# Patient Record
Sex: Male | Born: 1970 | Race: Black or African American | Hispanic: No | Marital: Married | Smoking: Never smoker
Health system: Southern US, Community
[De-identification: ages and names within clinical notes are randomized; demographics above are authoritative.]

## PROBLEM LIST (undated history)

## (undated) DIAGNOSIS — I10 Essential (primary) hypertension: Secondary | ICD-10-CM

## (undated) DIAGNOSIS — K8050 Calculus of bile duct without cholangitis or cholecystitis without obstruction: Secondary | ICD-10-CM

## (undated) DIAGNOSIS — Z9989 Dependence on other enabling machines and devices: Secondary | ICD-10-CM

## (undated) DIAGNOSIS — Z973 Presence of spectacles and contact lenses: Secondary | ICD-10-CM

## (undated) DIAGNOSIS — M7630 Iliotibial band syndrome, unspecified leg: Secondary | ICD-10-CM

## (undated) DIAGNOSIS — G4730 Sleep apnea, unspecified: Secondary | ICD-10-CM

## (undated) DIAGNOSIS — H409 Unspecified glaucoma: Secondary | ICD-10-CM

## (undated) DIAGNOSIS — H269 Unspecified cataract: Secondary | ICD-10-CM

## (undated) DIAGNOSIS — I440 Atrioventricular block, first degree: Secondary | ICD-10-CM

## (undated) DIAGNOSIS — M1990 Unspecified osteoarthritis, unspecified site: Secondary | ICD-10-CM

## (undated) DIAGNOSIS — D1803 Hemangioma of intra-abdominal structures: Secondary | ICD-10-CM

## (undated) DIAGNOSIS — K589 Irritable bowel syndrome without diarrhea: Secondary | ICD-10-CM

## (undated) DIAGNOSIS — G4733 Obstructive sleep apnea (adult) (pediatric): Secondary | ICD-10-CM

## (undated) HISTORY — DX: Iliotibial band syndrome, unspecified leg: M76.30

## (undated) HISTORY — PX: COLONOSCOPY WITH ESOPHAGOGASTRODUODENOSCOPY (EGD): SHX5779

## (undated) HISTORY — PX: WRIST FRACTURE SURGERY: SHX121

## (undated) HISTORY — PX: UPPER ESOPHAGEAL ENDOSCOPIC ULTRASOUND (EUS): SHX6562

## (undated) HISTORY — DX: Sleep apnea, unspecified: G47.30

## (undated) HISTORY — DX: Unspecified osteoarthritis, unspecified site: M19.90

## (undated) HISTORY — DX: Atrioventricular block, first degree: I44.0

## (undated) HISTORY — DX: Unspecified cataract: H26.9

---

## 1997-09-23 ENCOUNTER — Emergency Department (HOSPITAL_COMMUNITY): Admission: EM | Admit: 1997-09-23 | Discharge: 1997-09-23 | Payer: Self-pay | Admitting: Emergency Medicine

## 1999-06-19 ENCOUNTER — Emergency Department (HOSPITAL_COMMUNITY): Admission: EM | Admit: 1999-06-19 | Discharge: 1999-06-19 | Payer: Self-pay

## 1999-08-02 ENCOUNTER — Other Ambulatory Visit: Admission: RE | Admit: 1999-08-02 | Discharge: 1999-08-02 | Payer: Self-pay | Admitting: Gastroenterology

## 1999-08-02 ENCOUNTER — Encounter (INDEPENDENT_AMBULATORY_CARE_PROVIDER_SITE_OTHER): Payer: Self-pay | Admitting: Specialist

## 1999-10-09 ENCOUNTER — Encounter: Payer: Self-pay | Admitting: Emergency Medicine

## 1999-10-09 ENCOUNTER — Emergency Department (HOSPITAL_COMMUNITY): Admission: EM | Admit: 1999-10-09 | Discharge: 1999-10-09 | Payer: Self-pay | Admitting: Emergency Medicine

## 2000-05-10 ENCOUNTER — Emergency Department (HOSPITAL_COMMUNITY): Admission: EM | Admit: 2000-05-10 | Discharge: 2000-05-10 | Payer: Self-pay | Admitting: Emergency Medicine

## 2000-05-10 ENCOUNTER — Encounter: Payer: Self-pay | Admitting: Emergency Medicine

## 2001-04-01 ENCOUNTER — Encounter: Payer: Self-pay | Admitting: Gastroenterology

## 2001-04-01 ENCOUNTER — Inpatient Hospital Stay (HOSPITAL_COMMUNITY): Admission: EM | Admit: 2001-04-01 | Discharge: 2001-04-02 | Payer: Self-pay | Admitting: Gastroenterology

## 2001-04-07 ENCOUNTER — Emergency Department (HOSPITAL_COMMUNITY): Admission: EM | Admit: 2001-04-07 | Discharge: 2001-04-08 | Payer: Self-pay | Admitting: Emergency Medicine

## 2001-05-06 ENCOUNTER — Encounter: Payer: Self-pay | Admitting: Gastroenterology

## 2001-05-06 ENCOUNTER — Ambulatory Visit (HOSPITAL_COMMUNITY): Admission: RE | Admit: 2001-05-06 | Discharge: 2001-05-06 | Payer: Self-pay | Admitting: Gastroenterology

## 2002-08-09 ENCOUNTER — Emergency Department (HOSPITAL_COMMUNITY): Admission: EM | Admit: 2002-08-09 | Discharge: 2002-08-09 | Payer: Self-pay | Admitting: Emergency Medicine

## 2002-08-09 ENCOUNTER — Encounter: Payer: Self-pay | Admitting: Emergency Medicine

## 2002-09-07 ENCOUNTER — Ambulatory Visit (HOSPITAL_COMMUNITY): Admission: RE | Admit: 2002-09-07 | Discharge: 2002-09-07 | Payer: Self-pay | Admitting: Gastroenterology

## 2002-09-07 ENCOUNTER — Encounter: Payer: Self-pay | Admitting: Gastroenterology

## 2003-06-28 ENCOUNTER — Emergency Department (HOSPITAL_COMMUNITY): Admission: EM | Admit: 2003-06-28 | Discharge: 2003-06-28 | Payer: Self-pay | Admitting: Emergency Medicine

## 2004-05-21 ENCOUNTER — Ambulatory Visit (HOSPITAL_BASED_OUTPATIENT_CLINIC_OR_DEPARTMENT_OTHER): Admission: RE | Admit: 2004-05-21 | Discharge: 2004-05-21 | Payer: Self-pay | Admitting: Otolaryngology

## 2005-08-07 ENCOUNTER — Ambulatory Visit: Payer: Self-pay | Admitting: Gastroenterology

## 2007-01-14 ENCOUNTER — Ambulatory Visit: Payer: Self-pay | Admitting: Gastroenterology

## 2007-01-14 LAB — CONVERTED CEMR LAB
ALT: 31 units/L (ref 0–53)
Basophils Relative: 0.9 % (ref 0.0–1.0)
Bilirubin, Direct: 0.1 mg/dL (ref 0.0–0.3)
CO2: 29 meq/L (ref 19–32)
Calcium: 9.1 mg/dL (ref 8.4–10.5)
Eosinophils Absolute: 0.1 10*3/uL (ref 0.0–0.6)
Eosinophils Relative: 1.2 % (ref 0.0–5.0)
GFR calc Af Amer: 99 mL/min
GFR calc non Af Amer: 81 mL/min
Glucose, Bld: 97 mg/dL (ref 70–99)
HCT: 41.3 % (ref 39.0–52.0)
Hemoglobin: 13.9 g/dL (ref 13.0–17.0)
Lymphocytes Relative: 35.6 % (ref 12.0–46.0)
MCV: 83.2 fL (ref 78.0–100.0)
Monocytes Absolute: 0.5 10*3/uL (ref 0.2–0.7)
Neutro Abs: 3.4 10*3/uL (ref 1.4–7.7)
Neutrophils Relative %: 53.8 % (ref 43.0–77.0)
Potassium: 3.6 meq/L (ref 3.5–5.1)
Sed Rate: 5 mm/hr (ref 0–20)
Sodium: 143 meq/L (ref 135–145)
Total Protein: 6.9 g/dL (ref 6.0–8.3)
WBC: 6.3 10*3/uL (ref 4.5–10.5)

## 2007-03-10 ENCOUNTER — Ambulatory Visit: Payer: Self-pay | Admitting: Gastroenterology

## 2007-03-24 ENCOUNTER — Ambulatory Visit: Payer: Self-pay | Admitting: Gastroenterology

## 2007-03-24 ENCOUNTER — Encounter: Payer: Self-pay | Admitting: Gastroenterology

## 2007-09-06 DIAGNOSIS — I10 Essential (primary) hypertension: Secondary | ICD-10-CM | POA: Insufficient documentation

## 2007-09-06 DIAGNOSIS — D1803 Hemangioma of intra-abdominal structures: Secondary | ICD-10-CM

## 2007-09-06 HISTORY — DX: Essential (primary) hypertension: I10

## 2007-09-06 HISTORY — DX: Hemangioma of intra-abdominal structures: D18.03

## 2008-10-26 ENCOUNTER — Ambulatory Visit: Payer: Self-pay | Admitting: Gastroenterology

## 2009-03-04 ENCOUNTER — Encounter: Payer: Self-pay | Admitting: Gastroenterology

## 2009-03-15 ENCOUNTER — Ambulatory Visit: Payer: Self-pay | Admitting: Gastroenterology

## 2009-03-15 DIAGNOSIS — E669 Obesity, unspecified: Secondary | ICD-10-CM

## 2009-03-15 HISTORY — DX: Obesity, unspecified: E66.9

## 2009-03-16 LAB — CONVERTED CEMR LAB
AST: 39 units/L — ABNORMAL HIGH (ref 0–37)
Alkaline Phosphatase: 38 units/L — ABNORMAL LOW (ref 39–117)
Chloride: 102 meq/L (ref 96–112)
Eosinophils Absolute: 0 10*3/uL (ref 0.0–0.7)
Eosinophils Relative: 0.3 % (ref 0.0–5.0)
Ferritin: 157.6 ng/mL (ref 22.0–322.0)
GFR calc non Af Amer: 73.35 mL/min (ref >60)
Iron: 51 ug/dL (ref 42–165)
Lymphocytes Relative: 27.6 % (ref 12.0–46.0)
MCV: 82.7 fL (ref 78.0–100.0)
Monocytes Absolute: 0.6 10*3/uL (ref 0.1–1.0)
Neutrophils Relative %: 63.9 % (ref 43.0–77.0)
Platelets: 186 10*3/uL (ref 150.0–400.0)
Potassium: 4.2 meq/L (ref 3.5–5.1)
Saturation Ratios: 13.8 % — ABNORMAL LOW (ref 20.0–50.0)
Sed Rate: 7 mm/hr (ref 0–22)
Sodium: 142 meq/L (ref 135–145)
Total Bilirubin: 1 mg/dL (ref 0.3–1.2)
Transferrin: 263.7 mg/dL (ref 212.0–360.0)
WBC: 7.6 10*3/uL (ref 4.5–10.5)

## 2009-03-21 ENCOUNTER — Ambulatory Visit (HOSPITAL_COMMUNITY): Admission: RE | Admit: 2009-03-21 | Discharge: 2009-03-21 | Payer: Self-pay | Admitting: Gastroenterology

## 2009-05-03 ENCOUNTER — Ambulatory Visit: Payer: Self-pay | Admitting: Internal Medicine

## 2009-05-03 ENCOUNTER — Telehealth: Payer: Self-pay | Admitting: Gastroenterology

## 2009-05-03 DIAGNOSIS — R1031 Right lower quadrant pain: Secondary | ICD-10-CM

## 2009-05-10 LAB — CONVERTED CEMR LAB
Basophils Absolute: 0.1 10*3/uL (ref 0.0–0.1)
Bilirubin Urine: NEGATIVE
Leukocytes, UA: NEGATIVE
Lymphocytes Relative: 37.9 % (ref 12.0–46.0)
Lymphs Abs: 2.3 10*3/uL (ref 0.7–4.0)
Monocytes Relative: 11.1 % (ref 3.0–12.0)
Neutrophils Relative %: 49 % (ref 43.0–77.0)
Nitrite: NEGATIVE
Platelets: 182 10*3/uL (ref 150.0–400.0)
RDW: 12.7 % (ref 11.5–14.6)
Specific Gravity, Urine: 1.02 (ref 1.000–1.030)
pH: 5.5 (ref 5.0–8.0)

## 2009-09-23 ENCOUNTER — Ambulatory Visit (HOSPITAL_COMMUNITY): Admission: RE | Admit: 2009-09-23 | Discharge: 2009-09-24 | Payer: Self-pay | Admitting: Ophthalmology

## 2009-09-23 HISTORY — PX: RETINAL DETACHMENT REPAIR W/ SCLERAL BUCKLE LE: SHX2338

## 2010-04-20 ENCOUNTER — Emergency Department (HOSPITAL_COMMUNITY)
Admission: EM | Admit: 2010-04-20 | Discharge: 2010-04-20 | Disposition: A | Discharge status: Other Acute Inpatient Hospital | Payer: Self-pay | Source: Home / Self Care | Admitting: Family Medicine

## 2010-04-20 ENCOUNTER — Emergency Department (HOSPITAL_COMMUNITY): Admission: EM | Admit: 2010-04-20 | Discharge: 2010-04-21 | Payer: Self-pay | Admitting: Emergency Medicine

## 2010-04-21 ENCOUNTER — Encounter (INDEPENDENT_AMBULATORY_CARE_PROVIDER_SITE_OTHER): Payer: Self-pay | Admitting: *Deleted

## 2010-05-23 ENCOUNTER — Ambulatory Visit: Payer: Self-pay | Admitting: Gastroenterology

## 2010-05-23 DIAGNOSIS — K589 Irritable bowel syndrome without diarrhea: Secondary | ICD-10-CM

## 2010-05-23 HISTORY — DX: Irritable bowel syndrome, unspecified: K58.9

## 2010-05-24 LAB — CONVERTED CEMR LAB: Transferrin: 290.1 mg/dL (ref 212.0–360.0)

## 2010-07-06 NOTE — Assessment & Plan Note (Signed)
Summary: CROHNS ACTING UP/YF    History of Present Illness Visit Type: Follow-up Visit Primary GI MD: Sheryn Bison MD FACP FAGA Primary Provider: Mila Palmer, MD  Requesting Provider: n/a Chief Complaint: Crohn's flare 2 weeks ago,no symptoms currently History of Present Illness:   40 year old African American male that has probable irritable bowel syndrome. He recently had crampy lower abdominal pain and had a CT scan of his abdomen and pelvis that was unremarkable. He is having regular bowel movements and denies melena, hematochezia, upper GI or hepatobiliary complaints. His appetite is good and he continues to be obese without any definite food intolerances. Review of his lab data including CBC and metabolic profile is normal.   GI Review of Systems      Denies abdominal pain, acid reflux, belching, bloating, chest pain, dysphagia with liquids, dysphagia with solids, heartburn, loss of appetite, nausea, vomiting, vomiting blood, weight loss, and  weight gain.        Denies anal fissure, black tarry stools, change in bowel habit, constipation, diarrhea, diverticulosis, fecal incontinence, heme positive stool, hemorrhoids, irritable bowel syndrome, jaundice, light color stool, liver problems, rectal bleeding, and  rectal pain.    Current Medications (verified): 1)  Lisinopril-Hydrochlorothiazide 20-25 Mg Tabs (Lisinopril-Hydrochlorothiazide) .... One Tablet By Mouth Once Daily  Allergies (verified): No Known Drug Allergies  Past History:  Past medical, surgical, family and social histories (including risk factors) reviewed for relevance to current acute and chronic problems.  Past Medical History: Reviewed history from 05/03/2009 and no changes required. Current Problems:  HYPERTENSION (ICD-401.9) HEMANGIOMA, HEPATIC (ICD-228.04) COLONIC POLYPS (ICD-211.3) ILEITIS (ICD-558.9) OBESITY  Past Surgical History: Reviewed history from 03/15/2009 and no changes  required. Unremarkable  Family History: Reviewed history from 03/15/2009 and no changes required. No FH of Colon Cancer:  Social History: Reviewed history from 03/15/2009 and no changes required. Occupation: [employer]  Patient has never smoked.  Alcohol Use - no Illicit Drug Use - no Patient gets regular exercise.  Review of Systems  The patient denies allergy/sinus, anemia, anxiety-new, arthritis/joint pain, back pain, blood in urine, breast changes/lumps, change in vision, confusion, cough, coughing up blood, depression-new, fainting, fatigue, fever, headaches-new, hearing problems, heart murmur, heart rhythm changes, itching, menstrual pain, muscle pains/cramps, night sweats, nosebleeds, pregnancy symptoms, shortness of breath, skin rash, sleeping problems, sore throat, swelling of feet/legs, swollen lymph glands, thirst - excessive , urination - excessive , urination changes/pain, urine leakage, vision changes, and voice change.    Vital Signs:  Patient profile:   40 year old male Height:      73 inches Weight:      273.50 pounds BMI:     36.21 Pulse rate:   76 / minute Pulse rhythm:   regular BP sitting:   136 / 82  (left arm) Cuff size:   large  Vitals Entered By: June McMurray CMA Duncan Dull) (May 23, 2010 3:49 PM)  Physical Exam  General:  Well developed, well nourished, no acute distress.obese.   Head:  Normocephalic and atraumatic. Eyes:  PERRLA, no icterus. Abdomen:  Soft, nontender and nondistended. No masses, hepatosplenomegaly or hernias noted. Normal bowel sounds.obese.   Psych:  Alert and cooperative. Normal mood and affect.   Impression & Recommendations:  Problem # 1:  IRRITABLE BOWEL SYNDROME (ICD-564.1) Assessment Unchanged p.r.n. sublingual Levsin as needed. I see no need for followup colonoscopy at this time.  Problem # 2:  OBESITY, UNSPECIFIED (ICD-278.00) Assessment: Deteriorated He is not a good candidate for bariatric surgery  despite a BMI of 36.21.  Other Orders: TLB-B12, Serum-Total ONLY (16109-U04) TLB-Folic Acid (Folate) (82746-FOL) TLB-Iron, (Fe) Total (83540-FE) TLB-IBC Pnl (Iron/FE;Transferrin) (83550-IBC) TLB-Sedimentation Rate (ESR) (85652-ESR)  Patient Instructions: 1)  Please pick up your prescriptions at the pharmacy. Electronic prescription(s) has already been sent for Levsin every 6-8 hours as needed. 2)  Your physician requests that you go to the basement floor of our office to have the following labwork completed before leaving today: Sedimentation rate, IBC, Ferritin, Vitamin B12, Folic Acid 3)  Copy sent to : Mila Palmer, MD 4)  The medication list was reviewed and reconciled.  All changed / newly prescribed medications were explained.  A complete medication list was provided to the patient / caregiver. Prescriptions: LEVSIN 0.125 MG TABS (HYOSCYAMINE SULFATE) Take 1 tablet by mouth every 8 hours as needed  #60 x 1   Entered by:   Lamona Curl CMA (AAMA)   Authorized by:   Mardella Layman MD [facility]   Signed by:   Lamona Curl CMA (AAMA) on 05/23/2010   Method used:   Electronically to        [facility] [phone]* (retail)       [address]       [place], [place]  [address]       Ph: [phone]       Fax: [phone]   RxID:   [id number]

## 2010-08-15 LAB — DIFFERENTIAL
Basophils Absolute: 0 10*3/uL (ref 0.0–0.1)
Lymphocytes Relative: 26 % (ref 12–46)
Monocytes Absolute: 0.5 10*3/uL (ref 0.1–1.0)
Monocytes Relative: 8 % (ref 3–12)
Neutro Abs: 4.2 10*3/uL (ref 1.7–7.7)

## 2010-08-15 LAB — URINALYSIS, ROUTINE W REFLEX MICROSCOPIC
Glucose, UA: NEGATIVE mg/dL
Leukocytes, UA: NEGATIVE
Nitrite: NEGATIVE
Protein, ur: NEGATIVE mg/dL
Urobilinogen, UA: 0.2 mg/dL (ref 0.0–1.0)

## 2010-08-15 LAB — COMPREHENSIVE METABOLIC PANEL
Albumin: 4.2 g/dL (ref 3.5–5.2)
BUN: 7 mg/dL (ref 6–23)
Creatinine, Ser: 1.13 mg/dL (ref 0.4–1.5)
Total Protein: 7.3 g/dL (ref 6.0–8.3)

## 2010-08-15 LAB — URINE MICROSCOPIC-ADD ON

## 2010-08-15 LAB — CBC
MCH: 27.8 pg (ref 26.0–34.0)
MCHC: 34.9 g/dL (ref 30.0–36.0)
MCV: 79.6 fL (ref 78.0–100.0)
Platelets: 195 10*3/uL (ref 150–400)
RDW: 12.7 % (ref 11.5–15.5)

## 2010-08-22 LAB — BASIC METABOLIC PANEL
CO2: 30 mEq/L (ref 19–32)
GFR calc Af Amer: >60 mL/min (ref >60)
GFR calc non Af Amer: >60 mL/min (ref >60)
Glucose, Bld: 113 mg/dL — ABNORMAL HIGH (ref 70–99)
Potassium: 4.5 mEq/L (ref 3.5–5.1)
Sodium: 139 mEq/L (ref 135–145)

## 2010-08-22 LAB — CBC
HCT: 42.8 % (ref 39.0–52.0)
Hemoglobin: 14.6 g/dL (ref 13.0–17.0)
RBC: 5.09 MIL/uL (ref 4.22–5.81)
RDW: 13.6 % (ref 11.5–15.5)

## 2010-09-10 ENCOUNTER — Inpatient Hospital Stay (INDEPENDENT_AMBULATORY_CARE_PROVIDER_SITE_OTHER)
Admission: RE | Admit: 2010-09-10 | Discharge: 2010-09-10 | Disposition: A | Discharge status: Home / Self Care | Payer: Self-pay | Source: Ambulatory Visit | Attending: Emergency Medicine | Admitting: Emergency Medicine

## 2010-09-10 DIAGNOSIS — M549 Dorsalgia, unspecified: Secondary | ICD-10-CM

## 2010-10-17 NOTE — Assessment & Plan Note (Signed)
[facility]                         GASTROENTEROLOGY OFFICE NOTE   Andre Decker, Andre Decker                  MRN:          [id number]  DATE:01/14/2007                            DOB:          27-May-1971    Andre Decker had recurrent flares of right mid quadrant abdominal pain without  significant diarrhea or other systemic complaints. He denies any  hepatobiliary or upper GI problems. He has known recurrent ileitis and  suspected inflammatory bowel disease. Previous IBD serologies have been  negative.   He has taken Pentasa on a p.r.n. basis as well as multivitamins. He does  have hypertension and is on lisinopril 20 mg a day. He is followed at  [facility]. He denies abuse of NSAIDS and has not been  on recent antibiotics. He feels well overall and denies chronic fatigue,  fever, etc..   He is a healthy-appearing black male in no distress. He weighs 261  pounds and blood pressure is 130/86 and pulse is 60 and regular. I could  not appreciate hepatosplenomegaly, abdominal masses or tenderness. Bowel  sounds were normal.   ASSESSMENT:  Andre Decker has a very unusual constellation of symptoms and I am  not exactly sure that he has underlying inflammatory bowel disease.   RECOMMENDATIONS:  1. Check screening laboratory parameters.  2. Outpatient colonoscopy followup.  3. Change the Lialda to 2.4 grams a day on a regular basis.     Vania Rea. Jarold Motto, MD, Caleen Essex, FAGA  Electronically Signed    DRP/MedQ  DD: 01/14/2007  DT: 01/15/2007  Job #: [id number]

## 2010-10-20 NOTE — H&P (Signed)
[facility]  Decker:    Andre Decker, Andre Decker Visit Number: [id number] MRN: [id number]          Service Type: Attending:  Vania Rea. Jarold Motto, M.D. [facility] Dictated by:   Mike Gip, P.A.-C. Adm. Date:  04/01/01                           History and Physical  CHIEF COMPLAINT:  Acute, severe midabdominal pain with nausea and vomiting.  HISTORY OF PRESENT ILLNESS:  Andre Decker is a pleasant 40 year old African-American male known to Dr. Jarold Motto, who does have a history of ileitis which was diagnosed in 1995 on colonoscopy.  He really has had fairly inactive disease since that time and had colonoscopy done in February 2001, which was done due to increased complaint of abdominal pain and bloating. This was a normal exam with intubation of Andre terminal ileum and biopsies of Andre terminal ileum were also unremarkable.  Andre Decker has not been on any maintenance medications over Andre past year.  He reports feeling fine recently with good appetite, stable weight, no ongoing abdominal discomfort.  His bowel movements have been normal without melena or hematochezia.  He is on no regular medications.  He presents now with acute onset of abdominal pain, onset about 8:30 in Andre morning after he had gone to work.  He reports Andre pain is constant and "severe" and sharp, located in his mid abdomen without radiation or migration thus far.  He rates his pain as a 12 out of 10.  This has been associated with one episode of nausea and vomiting, no diarrhea, no headache, cough, etc.  He has had chills, but no documented fever.  He is evaluated in Andre office at this time.  Temperature is 96.8.  He is hemodynamically stable with rather diffuse midabdominal tenderness.  No mass or hepatosplenomegaly.  He does have some rebound in Andre right and left mid quadrants.  Bowel sounds are present, but hypoactive.  Rectal exam is heme negative.  He is admitted at this time with acute pain for  pain control, hydration, baseline laboratories, abdominal films, and CT of Andre abdomen and pelvis.  CURRENT MEDICATIONS:  None regular.  ALLERGIES:  No known drug allergies.  PAST HISTORY:  History of ileitis diagnosed 75.  No other regular medical problems.  SOCIAL HISTORY:  Andre Decker is single.  He lives with a roommate.  He is employed working with handicap.  No ETOH, no tobacco.  FAMILY HISTORY:  Pertinent for various malignancies and cardiac disease.  No history of colon cancer or inflammatory bowel disease.  REVIEW OF SYSTEMS:  Cardiovascular:  Denies any chest pain or anginal symptoms.  Pulmonary:  Negative for cough, shortness of breath, sputum production.  Genitourinary:  Denies any dysuria, urgency, or frequency.  GI: As above.  PHYSICAL EXAMINATION:  GENERAL:  Well-developed, ill-appearing African-American male.  He is moaning, alert and oriented x3.  VITAL SIGNS:  Blood pressure 126/78, pulse 74 and regular, temperature 96.8.  HEENT:  Atraumatic, normocephalic.  EOMI.  PERRLA.  Sclerae anicteric.  CARDIOVASCULAR:  Regular rate and rhythm with S1, S2, no murmur, rub, or gallop.  PULMONARY:  Clear to A&P.  ABDOMEN:  Large, soft, bowel sounds are active.  He is tender rather diffusely in Andre mid abdomen.  No palpable mass or hepatosplenomegaly.  He does have rebound in Andre right and left mid quadrants.  RECTAL:  Heme negative and without mass.  EXTREMITIES:  Unremarkable.  IMPRESSION: 43. A 40 year old African-American male with acute severe abdominal pain with    nausea and vomiting.  Etiology is unclear.  Rule out acute abdomen with    appendicitis, ileitis, small-bowel obstruction versus possible viral    gastroenteritis. 2. History of ileitis, 1995.  PLAN:  Andre Decker is admitted to Andre service of Dr. Sheryn Bison for IV fluid hydration, pain control, baseline laboratories, IV Unasyn, plain films, and CT of Andre abdomen and pelvis.  For details,  please see Andre orders. Dictated by:   Mike Gip, P.A.-C. Attending:  Vania Rea. Jarold Motto, M.D. [facility] DD:  04/01/01 TD:  04/02/01 Job: [id number]

## 2010-10-20 NOTE — Procedures (Signed)
NAME:  Andre Decker, Andre Decker NO.:  [id number]   MEDICAL RECORD NO.:  [id number]          PATIENT TYPE:  OUT   LOCATION:  SLEEP CENTER                 FACILITY:  [facility]   PHYSICIAN:  Clinton D. Maple Hudson, M.D. DATE OF BIRTH:  21-Apr-1971   DATE OF STUDY:  05/21/2004                              NOCTURNAL POLYSOMNOGRAM   INDICATION FOR STUDY:  Hypersomnia with sleep apnea.  Epworth sleepiness  score is 9/24, BMI 34.4, weight 260 pounds, neck size 18.5 inches.   SLEEP ARCHITECTURE:  Total sleep time 343 minutes with sleep efficiency 70%.  Stage 1 was 5%, stage 2 74%, stages 3 and 4 were absent, REM was 21% of  total sleep time, sleep latency 139 minutes, REM latency 115 minutes, awake  after sleep onset 14 minutes, arousal index 13.8.   RESPIRATORY DATA:  NPSG protocol.  RDI 31 per hour, indicating moderately  severe obstructive sleep apnea/hypopnea syndrome.  This included 20  obstructive apneas and 157 hypopneas.  Events were nonpositional but most  sleep and, therefore, most events were while supine.  REM RDI 51.9.   OXYGEN DATA:  Moderate to severe snoring with oxygen desaturation to a nadir  of 82% with respiratory events.  Mean oxygen saturation was 93-94% on room  air.   CARDIAC DATA:  Sinus bradycardia 31-76 b.p.m.   MOVEMENT/PARASOMNIA:  No leg jerks, no bathroom trips.  The patient was  reported as talking on his cell phone for 2 hours after lights out, despite  technician request to end the call.  This led to a delay in sleep onset and  may reflect poor sleep hygiene which could be discussed with the patient as  appropriate.   IMPRESSION/RECOMMENDATION:  Moderately severe obstructive sleep  apnea/hypopnea syndrome, RDI 31 per hour with desaturation to 82%.  Sinus  bradycardia.  Note above comments on sleep hygiene.                                                           Clinton D. Maple Hudson, M.D.  Diplomate, [employer] Board   CDY/MEDQ  D:  05/28/2004 13:38:04   T:  05/29/2004 16:35:11  Job:  [id number]

## 2010-10-20 NOTE — Discharge Summary (Signed)
[facility]  Patient:    Andre Decker, Andre Decker Visit Number: [id number] MRN: [id number]          Service Type: EMS Location: ED Attending Physician:  Ilene Qua Dictated by:   Sammuel Cooper, P.A.C. Admit Date:  04/07/2001 Discharge Date: 04/08/2001   CC:         Andre Decker. Eloise Harman, M.D.   Discharge Summary  ADMISSION DIAGNOSES: 75. A 40 year old African-American male with acute severe abdominal pain with    nausea and vomiting, etiology unclear.  Rule out acute abdomen with    appendicitis, ileitis, and small bowel obstruction versus possible viral    gastroenteritis. 2. History of ileitis in 1995.  DISCHARGE DIAGNOSES: 1. Acute abdominal pain, nausea, vomiting, and fever.  Symptomatically much    improved with negative CT of the abdomen.  Suspect viral gastroenteritis. 2. History of ileitis in 1995. 3. A 1.6 cm liver lesion most consistent with hemangioma.  May need follow-up    with MRI. 4. Fatty infiltration of the liver.  CONSULTATIONS:  None.  PROCEDURES: 1. CT scan of the abdomen and pelvis. 2. Plain abdominal films.  BRIEF HISTORY:  [name] is a pleasant, 40 year old, African-American male known to Andre Decker. Eloise Harman, M.D., with a history of ileitis which was diagnosed in 1995 on colonoscopy.  He has had fairly inactive disease since that time and actually had a colonoscopy in February of 2001, which was done due to increased complaints of abdominal pain and bloating.  This was a normal exam with intubation of the terminal ileum and biopsies of the terminal ileum were also unremarkable.  He has not been on any maintenance medications over the past year and reports feeling fine recently with no abdominal pain, cramping, altered bowel movements, melena, or hematochezia.  At this time, he presents to the office with acute onset of abdominal pain which started about 8:30 a.m. the morning of admission after he had gone to work.   He reports this as being a constant and severe sharp pain located in the mid abdomen without radiation and rates it a 12/10.  This was associated with one episode of nausea and vomiting.  No diarrhea, headache, etc.  He has had some chills, but no documented fever.  He walked into the office, was seen and evaluated acutely, and noted to be afebrile and hemodynamically stable, but with rather diffuse mid abdominal tenderness.  He was admitted to the hospital for pain control, hydration, baseline labs, CT of the abdomen, and further diagnostic work-up.  LABORATORY STUDIES:  On admission on April 01, 2001, WBC 8.6, hemoglobin 14.7, hematocrit 43.2, MCV 81, and platelets 218.  The sedimentation rate was 4.  Electrolytes within normal limits.  Glucose 117, BUN 15, creatinine 1.3, albumin 4.4.  Liver function studies were normal with the exception of a total bilirubin at 1.9, amylase 61, and lipase 14.  The urinalysis was negative.  Plain abdominal films were unremarkable.  CT scan of the abdomen and pelvis with contrast showed a 1.6 cm low-density right lobe liver mass, nonspecific, possibly representing a hemangioma.  This could be further evaluated with ultrasound, diffuse fatting infiltration of the liver.  The pelvic loops of colon and small bowel were felt to be normal.  No enlarged lymph nodes. Otherwise no abnormality.  HOSPITAL COURSE:  The patient was admitted to the service of Andre Decker. Eloise Harman, M.D., with acute abdominal pain.  Baseline labs were obtained.  He was started on IV fluids  and given Demerol and Phenergan as needed for pain and nausea.  He was covered empirically with IV Unasyn and scheduled for a CT scan of the abdomen and pelvis.  By the following morning, he was feeling much better with markedly decreased pain and nausea and was asking for food.  He did have a low-grade temperature of 100.7 degrees in the hospital, but CT of the abdomen and pelvis was read as  negative with the exception of a probable small hemangioma in the liver.  He was also noted to have fatty infiltration of the liver.   His diet was advanced, which he tolerated without difficulty.  DISPOSITION:  He was then allowed discharged to home later that same day.  FOLLOW-UP:  Discharged with instructions to follow up with Andre Decker. Eloise Harman, M.D., in the office.  ACTIVITY:  He was instructed to remain out of work for the next two to three days.  DIET:  To start with a full liquid diet and advanced to a soft bland diet as tolerated.  DISCHARGE MEDICATIONS: 1. Pentasa 250 mg two p.o. t.i.d. 2. Darvocet-N 100 one every six hours as needed for pain. 3. Phenergan 25 mg one every six hours as needed for nausea.  CONDITION ON DISCHARGE:  Stable. Dictated by:   Sammuel Cooper, P.A.C. Attending Physician:  Ilene Qua DD:  04/14/01 TD:  04/15/01 Job: [id number]

## 2011-05-03 ENCOUNTER — Emergency Department (INDEPENDENT_AMBULATORY_CARE_PROVIDER_SITE_OTHER): Payer: PRIVATE HEALTH INSURANCE

## 2011-05-03 ENCOUNTER — Encounter: Payer: Self-pay | Admitting: *Deleted

## 2011-05-03 ENCOUNTER — Emergency Department (HOSPITAL_COMMUNITY)
Admission: EM | Admit: 2011-05-03 | Discharge: 2011-05-03 | Disposition: A | Discharge status: Home / Self Care | Payer: PRIVATE HEALTH INSURANCE | Source: Home / Self Care | Attending: Emergency Medicine | Admitting: Emergency Medicine

## 2011-05-03 DIAGNOSIS — J111 Influenza due to unidentified influenza virus with other respiratory manifestations: Secondary | ICD-10-CM

## 2011-05-03 DIAGNOSIS — R6889 Other general symptoms and signs: Secondary | ICD-10-CM

## 2011-05-03 HISTORY — DX: Essential (primary) hypertension: I10

## 2011-05-03 MED ORDER — TRAMADOL HCL 50 MG PO TABS: 100.0000 mg | ORAL_TABLET | Freq: Three times a day (TID) | ORAL | Status: AC | PRN

## 2011-05-03 MED ORDER — BENZONATATE 200 MG PO CAPS: 200.0000 mg | ORAL_CAPSULE | Freq: Three times a day (TID) | ORAL | Status: AC | PRN

## 2011-05-03 MED ORDER — OSELTAMIVIR PHOSPHATE 75 MG PO CAPS: 75.0000 mg | ORAL_CAPSULE | Freq: Two times a day (BID) | ORAL | Status: AC

## 2011-05-03 NOTE — ED Notes (Signed)
Pt  Has  Symptoms  Coughing    Congested    abd  Pain  Fever   Nausea     Chest  Wall   Pain      Both  Sides  Worse  When he  Coughs

## 2011-05-03 NOTE — ED Provider Notes (Signed)
History     CSN: [id number] Arrival date & time: 05/03/2011  8:38 PM   First MD Initiated Contact with Patient 05/03/11 1831      Chief Complaint  Patient presents with  . Cough    (Consider location/radiation/quality/duration/timing/severity/associated sxs/prior treatment) HPI Comments: Andre Decker has had a one-day history of nausea, abdominal pain, loose stools, temperature of up to 101.1, generalized aching, chills, sweats, cough productive of white sputum, wheezing, chest pain, sore throat, and nasal congestion. He has had no known exposures, no flu shot, symptoms are worse if he sits up and better if he lies down. He's been trying NyQuil without much relief. His pain is 6 at the most and now is a 3.  Patient is a 40 y.o. male presenting with cough.  Cough Associated symptoms include chills, rhinorrhea and sore throat. Pertinent negatives include no ear pain, no shortness of breath, no wheezing and no eye redness.    Past Medical History  Diagnosis Date  . Hypertension   . Glaucoma   . Crohn disease     Past Surgical History  Procedure Date  . Eye surgery     Family History  Problem Relation Age of Onset  . Migraines Mother     History  Substance Use Topics  . Smoking status: Never Smoker   . Smokeless tobacco: Not on file  . Alcohol Use: No      Review of Systems  Constitutional: Positive for fever, chills, diaphoresis and fatigue.  HENT: Positive for congestion, sore throat and rhinorrhea. Negative for ear pain, sneezing, neck stiffness, voice change and postnasal drip.   Eyes: Negative for pain, discharge and redness.  Respiratory: Positive for cough. Negative for chest tightness, shortness of breath and wheezing.   Gastrointestinal: Positive for nausea and diarrhea. Negative for vomiting and abdominal pain.  Skin: Negative for rash.    Allergies  Review of patient's allergies indicates no known allergies.  Home Medications   Current Outpatient Rx   Name Route Sig Dispense Refill  . LISINOPRIL-HYDROCHLOROTHIAZIDE 20-12.5 MG PO TABS Oral Take 1 tablet by mouth daily.      Marland Kitchen BENZONATATE 200 MG PO CAPS Oral Take 1 capsule (200 mg total) by mouth 3 (three) times daily as needed for cough. 30 capsule 0  . OSELTAMIVIR PHOSPHATE 75 MG PO CAPS Oral Take 1 capsule (75 mg total) by mouth every 12 (twelve) hours. 10 capsule 0  . TRAMADOL HCL 50 MG PO TABS Oral Take 2 tablets (100 mg total) by mouth every 8 (eight) hours as needed for pain. Maximum dose= 8 tablets per day 30 tablet 0    BP 128/82  Pulse 82  Temp(Src) 99.2 F (37.3 C) (Oral)  SpO2 100%  Physical Exam  Nursing note and vitals reviewed. Constitutional: He appears well-developed and well-nourished. No distress.  HENT:  Head: Normocephalic and atraumatic.  Right Ear: External ear normal.  Left Ear: External ear normal.  Nose: Nose normal.  Mouth/Throat: Oropharynx is clear and moist. No oropharyngeal exudate.  Eyes: Conjunctivae and EOM are normal. Pupils are equal, round, and reactive to light. Right eye exhibits no discharge. Left eye exhibits no discharge.  Neck: Normal range of motion. Neck supple.  Cardiovascular: Normal rate, regular rhythm and normal heart sounds.   Pulmonary/Chest: Effort normal and breath sounds normal. No stridor. No respiratory distress. He has no wheezes. He has no rales. He exhibits no tenderness.  Lymphadenopathy:    He has no cervical adenopathy.  Skin: Skin  is warm and dry. No rash noted. He is not diaphoretic.    ED Course  Procedures (including critical care time)  Labs Reviewed - No data to display Dg Chest 2 View  05/03/2011  *RADIOLOGY REPORT*  Clinical Data:  Cough and fever.  CHEST - 2 VIEW  Comparison: 09/23/2009  Findings: The heart size and mediastinal contours are within normal limits.  Both lungs are clear.  The visualized skeletal structures are unremarkable.  IMPRESSION: No active disease.  Original Report Authenticated By:  Reola Calkins, M.D.     1. Influenza-like illness       MDM  He appears to have influenza. I discussed treatment with Tamiflu, but he isn't he could afford the medication, so would treat symptomatically. I did give him a prescription for the Tamiflu if he should decide to go ahead and get it filled.        Andre Lias, MD 05/03/11 [phone]

## 2011-05-03 NOTE — Discharge Instructions (Signed)
Influenza Facts Flu (influenza) is a contagious respiratory illness caused by the influenza viruses. It can cause mild to severe illness. While most healthy people recover from the flu without specific treatment and without complications, older people, young children, and people with certain health conditions are at higher risk for serious complications from the flu, including death. CAUSES   The flu virus is spread from person to person by respiratory droplets from coughing and sneezing.   A person can also become infected by touching an object or surface with a virus on it and then touching their mouth, eye or nose.   Adults may be able to infect others from 1 day before symptoms occur and up to 7 days after getting sick. So it is possible to give someone the flu even before you know you are sick and continue to infect others while you are sick.  SYMPTOMS   Fever (usually high).   Headache.   Tiredness (can be extreme).   Cough.   Sore throat.   Runny or stuffy nose.   Body aches.   Diarrhea and vomiting may also occur, particularly in children.   These symptoms are referred to as "flu-like symptoms". A lot of different illnesses, including the common cold, can have similar symptoms.  DIAGNOSIS   There are tests that can determine if you have the flu as long you are tested within the first 2 or 3 days of illness.   A doctor's exam and additional tests may be needed to identify if you have a disease that is a complicating the flu.  RISKS AND COMPLICATIONS  Some of the complications caused by the flu include:  Bacterial pneumonia or progressive pneumonia caused by the flu virus.   Loss of body fluids (dehydration).   Worsening of chronic medical conditions, such as heart failure, asthma, or diabetes.   Sinus problems and ear infections.  HOME CARE INSTRUCTIONS   Seek medical care early on.   If you are at high risk from complications of the flu, consult your health-care  provider as soon as you develop flu-like symptoms. Those at high risk for complications include:   People 65 years or older.   People with chronic medical conditions, including diabetes.   Pregnant women.   Young children.   Your caregiver may recommend use of an antiviral medication to help treat the flu.   If you get the flu, get plenty of rest, drink a lot of liquids, and avoid using alcohol and tobacco.   You can take over-the-counter medications to relieve the symptoms of the flu if your caregiver approves. (Never give aspirin to children or teenagers who have flu-like symptoms, particularly fever).  PREVENTION  The single best way to prevent the flu is to get a flu vaccine each fall. Other measures that can help protect against the flu are:  Antiviral Medications   A number of antiviral drugs are approved for use in preventing the flu. These are prescription medications, and a doctor should be consulted before they are used.   Habits for Good Health   Cover your nose and mouth with a tissue when you cough or sneeze, throw the tissue away after you use it.   Wash your hands often with soap and water, especially after you cough or sneeze. If you are not near water, use an alcohol-based hand cleaner.   Avoid people who are sick.   If you get the flu, stay home from work or school. Avoid contact with   other people so that you do not make them sick, too.   Try not to touch your eyes, nose, or mouth as germs ore often spread this way.  IN CHILDREN, EMERGENCY WARNING SIGNS THAT NEED URGENT MEDICAL ATTENTION:  Fast breathing or trouble breathing.   Bluish skin color.   Not drinking enough fluids.   Not waking up or not interacting.   Being so irritable that the child does not want to be held.   Flu-like symptoms improve but then return with fever and worse cough.   Fever with a rash.  IN ADULTS, EMERGENCY WARNING SIGNS THAT NEED URGENT MEDICAL ATTENTION:  Difficulty  breathing or shortness of breath.   Pain or pressure in the chest or abdomen.   Sudden dizziness.   Confusion.   Severe or persistent vomiting.  SEEK IMMEDIATE MEDICAL CARE IF:  You or someone you know is experiencing any of the symptoms above. When you arrive at the emergency center,report that you think you have the flu. You may be asked to wear a mask and/or sit in a secluded area to protect others from getting sick. MAKE SURE YOU:   Understand these instructions.   Monitor your condition.   Seek medical care if you are getting worse, or not improving.  Document Released: 05/24/2003 Document Revised: 01/31/2011 Document Reviewed: 02/17/2009 Exit[employer] Patient Information 2012 [employer]. 

## 2011-05-11 ENCOUNTER — Ambulatory Visit (INDEPENDENT_AMBULATORY_CARE_PROVIDER_SITE_OTHER): Payer: Self-pay | Admitting: Ophthalmology

## 2011-05-22 ENCOUNTER — Telehealth: Payer: Self-pay | Admitting: *Deleted

## 2011-05-22 NOTE — Telephone Encounter (Signed)
Left message that pt needs to schedule an office visit and labs for b12

## 2011-05-22 NOTE — Telephone Encounter (Signed)
Message copied by Leonette Monarch on Tue May 22, 2011  9:20 AM ------      Message from: Harlow Mares D      Created: Wed Aug 09, 2010 10:02 AM       Needs repeat b12 level.

## 2012-01-18 HISTORY — PX: CATARACT EXTRACTION W/ INTRAOCULAR LENS IMPLANT: SHX1309

## 2012-01-24 ENCOUNTER — Encounter (INDEPENDENT_AMBULATORY_CARE_PROVIDER_SITE_OTHER): Payer: 59 | Admitting: Ophthalmology

## 2012-01-24 ENCOUNTER — Telehealth: Payer: Self-pay | Admitting: Gastroenterology

## 2012-01-24 DIAGNOSIS — H43819 Vitreous degeneration, unspecified eye: Secondary | ICD-10-CM

## 2012-01-24 DIAGNOSIS — H33309 Unspecified retinal break, unspecified eye: Secondary | ICD-10-CM

## 2012-01-24 DIAGNOSIS — H35039 Hypertensive retinopathy, unspecified eye: Secondary | ICD-10-CM

## 2012-01-24 DIAGNOSIS — H33009 Unspecified retinal detachment with retinal break, unspecified eye: Secondary | ICD-10-CM

## 2012-01-24 DIAGNOSIS — I10 Essential (primary) hypertension: Secondary | ICD-10-CM

## 2012-01-24 NOTE — Telephone Encounter (Signed)
Left a message for patient telling him to call his PCP or whoever prescribed this medication to him because we did not. I also informed him that he is past due for a office visit if he needs any prescription refills anyway and to call back with any questions.

## 2012-01-25 ENCOUNTER — Other Ambulatory Visit: Payer: Self-pay | Admitting: Gastroenterology

## 2012-01-25 NOTE — Telephone Encounter (Signed)
Pt advised he has not been seen since 2011 and needs an office visit for refills, he was transferred to schedulers and will discuss meds at visit

## 2012-02-03 ENCOUNTER — Telehealth: Payer: Self-pay | Admitting: Internal Medicine

## 2012-02-03 MED ORDER — HYOSCYAMINE SULFATE 0.125 MG SL SUBL: 0.1250 mg | SUBLINGUAL_TABLET | Freq: Four times a day (QID) | SUBLINGUAL | Status: DC | PRN

## 2012-02-03 NOTE — Telephone Encounter (Signed)
Patient with IBS Has follow-up in 2 weeks but is out of hyoscyamine I rxed a refill

## 2012-02-19 ENCOUNTER — Other Ambulatory Visit (INDEPENDENT_AMBULATORY_CARE_PROVIDER_SITE_OTHER): Payer: 59

## 2012-02-19 ENCOUNTER — Ambulatory Visit (INDEPENDENT_AMBULATORY_CARE_PROVIDER_SITE_OTHER): Payer: 59 | Admitting: Gastroenterology

## 2012-02-19 ENCOUNTER — Encounter: Payer: Self-pay | Admitting: Gastroenterology

## 2012-02-19 VITALS — BP 126/90 | HR 68 | Ht 73.0 in | Wt 271.0 lb

## 2012-02-19 DIAGNOSIS — R1010 Upper abdominal pain, unspecified: Secondary | ICD-10-CM

## 2012-02-19 DIAGNOSIS — R109 Unspecified abdominal pain: Secondary | ICD-10-CM

## 2012-02-19 DIAGNOSIS — Z8719 Personal history of other diseases of the digestive system: Secondary | ICD-10-CM

## 2012-02-19 DIAGNOSIS — E669 Obesity, unspecified: Secondary | ICD-10-CM

## 2012-02-19 LAB — BASIC METABOLIC PANEL
CO2: 29 mEq/L (ref 19–32)
Calcium: 9 mg/dL (ref 8.4–10.5)
Chloride: 108 mEq/L (ref 96–112)
Glucose, Bld: 104 mg/dL — ABNORMAL HIGH (ref 70–99)
Potassium: 3.6 mEq/L (ref 3.5–5.1)
Sodium: 142 mEq/L (ref 135–145)

## 2012-02-19 LAB — CBC WITH DIFFERENTIAL/PLATELET
Basophils Absolute: 0 10*3/uL (ref 0.0–0.1)
Eosinophils Absolute: 0.4 10*3/uL (ref 0.0–0.7)
Lymphocytes Relative: 32.3 % (ref 12.0–46.0)
MCHC: 32.4 g/dL (ref 30.0–36.0)
MCV: 84.6 fl (ref 78.0–100.0)
Monocytes Absolute: 0.6 10*3/uL (ref 0.1–1.0)
Neutrophils Relative %: 50.7 % (ref 43.0–77.0)
Platelets: 183 10*3/uL (ref 150.0–400.0)
RDW: 13.5 % (ref 11.5–14.6)

## 2012-02-19 LAB — HEPATIC FUNCTION PANEL
ALT: 33 U/L (ref 0–53)
AST: 31 U/L (ref 0–37)
Albumin: 4.2 g/dL (ref 3.5–5.2)
Alkaline Phosphatase: 39 U/L (ref 39–117)
Total Protein: 7.5 g/dL (ref 6.0–8.3)

## 2012-02-19 LAB — IBC PANEL
Saturation Ratios: 18.1 % — ABNORMAL LOW (ref 20.0–50.0)
Transferrin: 260.2 mg/dL (ref 212.0–360.0)

## 2012-02-19 MED ORDER — TRAMADOL HCL 50 MG PO TABS: 50.0000 mg | ORAL_TABLET | Freq: Four times a day (QID) | ORAL | Status: DC | PRN

## 2012-02-19 NOTE — Progress Notes (Signed)
This is a 41 -year-old [place] American male with chronic IBS. He been doing well until September 1 when he developed severe upper nominal pain lasted 24 hours described as a crampy discomfort with some nausea but no vomiting. He was placed on Levsin sublingually which has been of mild benefit. He continues with some nagging discomfort in his upper abdomen but denies dyspepsia, reflux symptoms, dysphagia, or any specific hepatobiliary problems. Upper nominal ultrasound exam 3 years ago was unremarkable except for fatty infiltration of his liver associated with his obesity. His liver function tests have remained normal. The patient denies abuse of alcohol, cigarettes, or NSAIDs. His had previous endoscopy, colonoscopy, and CT scans which have been unremarkable. Family history is noncontributory. There is no history of melena, hematochezia, anorexia or weight loss. He also denies fever, chills, or other systemic complaints.   Current Medications, Allergies, Past Medical History, Past Surgical History, Family History and Social History were reviewed in [employer] record.  Pertinent Review of Systems Negative   Physical Exam: Healthy-appearing patient in no acute distress. Blood pressure 126/90, pulse 60 and regular, and weight 271 pounds with a BMI of 35.75. I cannot appreciate stigmata of chronic liver disease. His chest is clear and he is in a regular rhythm without murmurs gallops or rubs. He has obese abdomen without definite organomegaly, masses, or tenderness. Bowel sounds are normal. Rectal exam is deferred. Peripheral extremities unremarkable, mental status is normal.    Assessment and Plan: This patient's pain is upper gastrointestinal at this time. We need to exclude cholelithiasis with recent biliary colic. I have ordered labs reviewed and upper nominal ultrasound. I placed him on when necessary tramadol 50 mg every 6-8 hours as needed for pain pending evaluation. Review of  his chart does not reveal previous endoscopy which may need to be performed. Encounter Diagnosis  Name Primary?  Marland Kitchen Upper abdominal pain Yes

## 2012-02-19 NOTE — Patient Instructions (Addendum)
You have been scheduled for an abdominal ultrasound 02/26/12 at 9:30 please arrive in Radiology at [facility] at 9:15 You need to have nothing to eat or drink after midnight Please go to the basement today for lab work Dr. Jarold Motto has prescribed Ultram for you for pain.  You may pick this up at your pharmacy

## 2012-02-26 ENCOUNTER — Ambulatory Visit (HOSPITAL_COMMUNITY)
Admission: RE | Admit: 2012-02-26 | Discharge: 2012-02-26 | Disposition: A | Discharge status: Home / Self Care | Payer: 59 | Source: Ambulatory Visit | Attending: Gastroenterology | Admitting: Gastroenterology

## 2012-02-26 DIAGNOSIS — I10 Essential (primary) hypertension: Secondary | ICD-10-CM | POA: Insufficient documentation

## 2012-02-26 DIAGNOSIS — R109 Unspecified abdominal pain: Secondary | ICD-10-CM | POA: Insufficient documentation

## 2012-02-26 DIAGNOSIS — D1803 Hemangioma of intra-abdominal structures: Secondary | ICD-10-CM | POA: Insufficient documentation

## 2012-02-26 DIAGNOSIS — R1010 Upper abdominal pain, unspecified: Secondary | ICD-10-CM

## 2012-02-27 ENCOUNTER — Encounter: Payer: Self-pay | Admitting: Gastroenterology

## 2012-06-04 HISTORY — PX: COLONOSCOPY: SHX174

## 2012-07-19 ENCOUNTER — Other Ambulatory Visit: Payer: Self-pay

## 2012-09-02 ENCOUNTER — Encounter (HOSPITAL_COMMUNITY): Payer: Self-pay

## 2012-09-02 ENCOUNTER — Emergency Department (HOSPITAL_COMMUNITY): Payer: 59

## 2012-09-02 ENCOUNTER — Emergency Department (HOSPITAL_COMMUNITY)
Admission: EM | Admit: 2012-09-02 | Discharge: 2012-09-02 | Disposition: A | Discharge status: Home / Self Care | Payer: 59 | Attending: Emergency Medicine | Admitting: Emergency Medicine

## 2012-09-02 DIAGNOSIS — Z8719 Personal history of other diseases of the digestive system: Secondary | ICD-10-CM | POA: Insufficient documentation

## 2012-09-02 DIAGNOSIS — Z8669 Personal history of other diseases of the nervous system and sense organs: Secondary | ICD-10-CM | POA: Insufficient documentation

## 2012-09-02 DIAGNOSIS — Z79899 Other long term (current) drug therapy: Secondary | ICD-10-CM | POA: Insufficient documentation

## 2012-09-02 DIAGNOSIS — R112 Nausea with vomiting, unspecified: Secondary | ICD-10-CM | POA: Insufficient documentation

## 2012-09-02 DIAGNOSIS — I10 Essential (primary) hypertension: Secondary | ICD-10-CM | POA: Insufficient documentation

## 2012-09-02 DIAGNOSIS — R197 Diarrhea, unspecified: Secondary | ICD-10-CM

## 2012-09-02 LAB — CBC WITH DIFFERENTIAL/PLATELET
Basophils Relative: 0 % (ref 0–1)
Eosinophils Absolute: 0 10*3/uL (ref 0.0–0.7)
Eosinophils Relative: 0 % (ref 0–5)
Lymphs Abs: 0.5 10*3/uL — ABNORMAL LOW (ref 0.7–4.0)
MCH: 27.4 pg (ref 26.0–34.0)
MCHC: 35.9 g/dL (ref 30.0–36.0)
MCV: 76.2 fL — ABNORMAL LOW (ref 78.0–100.0)
Monocytes Relative: 5 % (ref 3–12)
Platelets: 166 10*3/uL (ref 150–400)
RBC: 5.41 MIL/uL (ref 4.22–5.81)

## 2012-09-02 LAB — BASIC METABOLIC PANEL
BUN: 15 mg/dL (ref 6–23)
Calcium: 9.2 mg/dL (ref 8.4–10.5)
GFR calc non Af Amer: 77 mL/min — ABNORMAL LOW (ref >90)
Glucose, Bld: 100 mg/dL — ABNORMAL HIGH (ref 70–99)
Sodium: 143 mEq/L (ref 135–145)

## 2012-09-02 MED ORDER — IOHEXOL 300 MG/ML  SOLN
25.0000 mL | INTRAMUSCULAR | Status: AC
  Administered 2012-09-02: 25 mL via ORAL

## 2012-09-02 MED ORDER — ONDANSETRON HCL 4 MG PO TABS: 4.0000 mg | ORAL_TABLET | Freq: Three times a day (TID) | ORAL | Status: DC | PRN

## 2012-09-02 MED ORDER — MORPHINE SULFATE 4 MG/ML IJ SOLN
4.0000 mg | Freq: Once | INTRAMUSCULAR | Status: AC
  Administered 2012-09-02: 4 mg via INTRAVENOUS
  Filled 2012-09-02: qty 1

## 2012-09-02 MED ORDER — SODIUM CHLORIDE 0.9 % IV SOLN
Freq: Once | INTRAVENOUS | Status: AC
  Administered 2012-09-02: 16:00:00 via INTRAVENOUS

## 2012-09-02 MED ORDER — ONDANSETRON HCL 4 MG/2ML IJ SOLN
4.0000 mg | Freq: Once | INTRAMUSCULAR | Status: AC
  Administered 2012-09-02: 4 mg via INTRAVENOUS
  Filled 2012-09-02: qty 2

## 2012-09-02 MED ORDER — HYDROMORPHONE HCL PF 1 MG/ML IJ SOLN
0.5000 mg | Freq: Once | INTRAMUSCULAR | Status: AC
  Administered 2012-09-02: 0.5 mg via INTRAVENOUS
  Filled 2012-09-02: qty 1

## 2012-09-02 MED ORDER — HYDROCODONE-ACETAMINOPHEN 5-325 MG PO TABS: 2.0000 | ORAL_TABLET | ORAL | Status: DC | PRN

## 2012-09-02 MED ORDER — HYDROMORPHONE HCL PF 1 MG/ML IJ SOLN
1.0000 mg | Freq: Once | INTRAMUSCULAR | Status: AC
  Administered 2012-09-02: 1 mg via INTRAVENOUS
  Filled 2012-09-02: qty 1

## 2012-09-02 MED ORDER — IOHEXOL 300 MG/ML  SOLN
100.0000 mL | Freq: Once | INTRAMUSCULAR | Status: AC | PRN
  Administered 2012-09-02: 100 mL via INTRAVENOUS

## 2012-09-02 NOTE — ED Notes (Signed)
CT notified pt finished with contrast. 

## 2012-09-02 NOTE — ED Notes (Signed)
Discharge instructions reviewed. Pt verbalized understanding.  

## 2012-09-02 NOTE — ED Notes (Signed)
Patient presents from home with sudden onset of sharp, bilateral upper quad abdominal pain that occurred this afternoon with nausea, vomiting, diaphoresis. Patient has hx of Crohn's, and reports this does not feel like his previous flair up.

## 2012-09-02 NOTE — ED Notes (Signed)
Pt returned from ct

## 2012-09-02 NOTE — ED Notes (Signed)
Pt states he has been having nausea/vomiting and diarrhea since 10:30 today. Pt states his two children have also been having some n/v. Rates pain 8/10, upper quadrant of abd described as sharp, squeezing, and cramping in nature.

## 2012-09-02 NOTE — ED Notes (Signed)
Pt transported to CT ?

## 2012-09-02 NOTE — ED Provider Notes (Signed)
History     CSN: [id number]  Arrival date & time 09/02/12  1545   First MD Initiated Contact with Patient 09/02/12 1545      Chief Complaint  Patient presents with  . Abdominal Pain    (Consider location/radiation/quality/duration/timing/severity/associated sxs/prior treatment) HPI Comments: This is a 42 year old male, past medical history remarkable for hypertension and Crohn's disease, who presents emergency department with chief complaint of nausea, vomiting, and diarrhea.  He also endorses sharp, crampy, and intermittent abdominal pain. Patient states the symptoms began this morning around 10:30. There were sudden in onset. They have been persistent since. He states that he has had approximately 4 episodes of vomiting, and has had multiple episodes of diarrhea several times per hour. He denies any bloody or bilious vomit or stool. He states that this does not feel like his typical Crohn's flares, and states that his Crohn's is well controlled.  He has not tried anything to alleviate his symptoms.  He rates his level of discomfort at an 8/10.  The pain does not radiate.  No previous abdominal surgeries.  The history is provided by the patient. No language interpreter was used.    Past Medical History  Diagnosis Date  . Hypertension   . Glaucoma(365)   . Crohn disease     Past Surgical History  Procedure Laterality Date  . Eye surgery      Family History  Problem Relation Age of Onset  . Migraines Mother     History  Substance Use Topics  . Smoking status: Never Smoker   . Smokeless tobacco: Never Used  . Alcohol Use: No      Review of Systems  All other systems reviewed and are negative.    Allergies  Review of patient's allergies indicates no known allergies.  Home Medications   Current Outpatient Rx  Name  Route  Sig  Dispense  Refill  . brimonidine-timolol (COMBIGAN) 0.2-0.5 % ophthalmic solution   Both Eyes   Place 1 drop into both eyes every 12  (twelve) hours.         Marland Kitchen EXPIRED: hyoscyamine (LEVSIN SL) 0.125 MG SL tablet   Sublingual   Place 1 tablet (0.125 mg total) under the tongue every 6 (six) hours as needed for cramping.   60 tablet   0   . lisinopril-hydrochlorothiazide (PRINZIDE,ZESTORETIC) 20-12.5 MG per tablet   Oral   Take 1 tablet by mouth daily.           . prednisoLONE acetate (PRED FORTE) 1 % ophthalmic suspension      1 drop 2 (two) times daily.         . traMADol (ULTRAM) 50 MG tablet   Oral   Take 1 tablet (50 mg total) by mouth every 6 (six) hours as needed for pain.   60 tablet   0   . travoprost, benzalkonium, (TRAVATAN) 0.004 % ophthalmic solution      1 drop at bedtime.           BP 144/96  Pulse 62  Temp(Src) 98.7 F (37.1 C) (Oral)  SpO2 100%  Physical Exam  Nursing note and vitals reviewed. Constitutional: He is oriented to person, place, and time. He appears well-developed and well-nourished.  HENT:  Head: Normocephalic and atraumatic.  Eyes: Conjunctivae and EOM are normal. Right eye exhibits no discharge. Left eye exhibits no discharge. No scleral icterus.  Neck: Normal range of motion. Neck supple. No JVD present.  Cardiovascular: Normal rate, regular rhythm,  normal heart sounds and intact distal pulses.  Exam reveals no gallop and no friction rub.   No murmur heard. Pulmonary/Chest: Effort normal and breath sounds normal. No respiratory distress. He has no wheezes. He has no rales. He exhibits no tenderness.  Abdominal: Soft. Bowel sounds are normal. He exhibits no distension and no mass. There is tenderness. There is guarding. There is no rebound.  Diffuse upper abdominal pain, also with some right lower quadrant tenderness, no left lower quadrant tenderness, no fluid wave, or signs of peritonitis, no Murphy's sign  Musculoskeletal: Normal range of motion. He exhibits no edema and no tenderness.  Neurological: He is alert and oriented to person, place, and time. He has  normal reflexes.  CN 3-12 intact  Skin: Skin is warm and dry.  Psychiatric: He has a normal mood and affect. His behavior is normal. Judgment and thought content normal.    ED Course  Procedures (including critical care time)  Results for orders placed during the hospital encounter of 09/02/12  CBC WITH DIFFERENTIAL      Result Value Range   WBC 6.1  4.0 - 10.5 K/uL   RBC 5.41  4.22 - 5.81 MIL/uL   Hemoglobin 14.8  13.0 - 17.0 g/dL   HCT 40.9  81.1 - 91.4 %   MCV 76.2 (*) 78.0 - 100.0 fL   MCH 27.4  26.0 - 34.0 pg   MCHC 35.9  30.0 - 36.0 g/dL   RDW 78.2  95.6 - 21.3 %   Platelets 166  150 - 400 K/uL   Neutrophils Relative 88 (*) 43 - 77 %   Neutro Abs 5.3  1.7 - 7.7 K/uL   Lymphocytes Relative 7 (*) 12 - 46 %   Lymphs Abs 0.5 (*) 0.7 - 4.0 K/uL   Monocytes Relative 5  3 - 12 %   Monocytes Absolute 0.3  0.1 - 1.0 K/uL   Eosinophils Relative 0  0 - 5 %   Eosinophils Absolute 0.0  0.0 - 0.7 K/uL   Basophils Relative 0  0 - 1 %   Basophils Absolute 0.0  0.0 - 0.1 K/uL  BASIC METABOLIC PANEL      Result Value Range   Sodium 143  135 - 145 mEq/L   Potassium 4.0  3.5 - 5.1 mEq/L   Chloride 109  96 - 112 mEq/L   CO2 24  19 - 32 mEq/L   Glucose, Bld 100 (*) 70 - 99 mg/dL   BUN 15  6 - 23 mg/dL   Creatinine, Ser 0.86  0.50 - 1.35 mg/dL   Calcium 9.2  8.4 - 57.8 mg/dL   GFR calc non Af Amer 77 (*) >90 mL/min   GFR calc Af Amer 89 (*) >90 mL/min   Ct Abdomen Pelvis W Contrast  09/02/2012  *RADIOLOGY REPORT*  Clinical Data: Epigastric pain.  History of Crohn's disease.  CT ABDOMEN AND PELVIS WITH CONTRAST  Technique:  Multidetector CT imaging of the abdomen and pelvis was performed following the standard protocol during bolus administration of intravenous contrast.  Contrast: OMNIPAQUE IOHEXOL 300 MG/ML  SOLN  Comparison: 04/21/2010  Findings: Stable hemangioma in the right lobe of the liver in a background of diffuse hepatic steatosis.  Gallbladder, spleen, pancreas, kidneys  are within normal limits.  Nodularity of the adrenal glands is stable.  Appendix and terminal ileum are within normal limits.  Sigmoid colon is unremarkable  No disproportionate dilatation of bowel.  No acute bony deformity  No free fluid.  No obvious abnormal adenopathy.  Multiple sub centimeter short axis diameter mesenteric nodes are noted.  IMPRESSION: No acute intra-abdominal pathology.   Original Report Authenticated By: Jolaine Click, M.D.    Dg Abd Acute W/chest  09/02/2012  *RADIOLOGY REPORT*  Clinical Data: Abdomen pain today, weakness, nausea and vomiting  ACUTE ABDOMEN SERIES (ABDOMEN 2 VIEW & CHEST 1 VIEW)  Comparison: Chest x-ray of 05/03/2011 and CT abdomen pelvis of 04/21/2010  Findings: No active infiltrate or effusion is seen.  Mediastinal contours are stable.  The heart is within upper limits normal.  No bony abnormality is seen.  Supine and erect views of the abdomen show scattered air-fluid levels with a paucity of bowel gas elsewhere suspicious for partial small bowel obstruction.  CT of the abdomen and pelvis is recommended to assess further.  No free air is seen on the erect view.  No opaque calculi are noted.  IMPRESSION:  1.  Suspect partial small bowel obstruction.  No free air. Recommend CT abdomen pelvis with IV contrast to assess further. 2.  No active lung disease.   Original Report Authenticated By: Dwyane Dee, M.D.       1. Nausea vomiting and diarrhea       MDM  42 year old male with nausea, vomiting, diarrhea, and diffuse abdominal pain. Treat with morphine, Zofran, and fluids. Check basic labs, and reassess.  6:24 PM Patient given 1mg  dilaudid, which helped ease his pain.  Plain films are concerning for SBO, will order CT to r/o.  CT is negative.  Patient has n/v/d.  With no acute finding on CT, suspicious of norovirus.  Patient is tolerating PO fluids.  Will discharge with pain meds and zofran.  Discussed the patient with Dr. Manus Gunning, who agrees with the  plan.     Roxy Horseman, PA-C 09/03/12 [phone]

## 2012-09-03 NOTE — ED Provider Notes (Signed)
Medical screening examination/treatment/procedure(s) were performed by non-physician practitioner and as supervising physician I was immediately available for consultation/collaboration.  Glynn Octave, MD 09/03/12 [phone]

## 2012-10-30 ENCOUNTER — Encounter: Payer: Self-pay | Admitting: *Deleted

## 2012-11-11 ENCOUNTER — Ambulatory Visit (INDEPENDENT_AMBULATORY_CARE_PROVIDER_SITE_OTHER): Payer: 59 | Admitting: Gastroenterology

## 2012-11-11 ENCOUNTER — Encounter: Payer: Self-pay | Admitting: Gastroenterology

## 2012-11-11 VITALS — BP 122/68 | HR 58 | Ht 73.0 in | Wt 267.0 lb

## 2012-11-11 DIAGNOSIS — R1011 Right upper quadrant pain: Secondary | ICD-10-CM

## 2012-11-11 DIAGNOSIS — K589 Irritable bowel syndrome without diarrhea: Secondary | ICD-10-CM

## 2012-11-11 MED ORDER — MOVIPREP 100 G PO SOLR: 1.0000 | Freq: Once | ORAL | Status: DC

## 2012-11-11 NOTE — Progress Notes (Signed)
This is a 42 year old African American male has had many years of recurrent episodic right upper quadrant pain which last several hours in duration, has no real precipitating or alleviating elements, and has defied any definitive diagnosis.  He recently had CT scan of his abdomen repeated by his primary care physician, and this was unremarkable.  Ultrasound in September of 2013. These episodes occur every several months, and are not associated with nausea and vomiting or any other symptoms but colicky RUQ  pain.  Endoscopy and colonoscopy were last performed in 2008.  He has voluntarily lost 20-30 pounds in weight, denies anorexia, true reflux symptoms, dysphagia, or any specific hepatobiliary or systemic complaints.  He does not abuse alcohol, cigarettes, or NSAIDs.  Patient also denies melena, hematochezia, or bowel  irregularity.  Review of his labs shows no specific abnormalities or elevation of his liver function tests.  Patient denies a specific food intolerances.  Radiographs have shown an asymptomatic right hepatic lobe hemangioma which is been stable in size and position over several years.   Current Medications, Allergies, Past Medical History, Past Surgical History, Family History and Social History were reviewed in [employer] record.  ROS: All systems were reviewed and are negative unless otherwise stated in the HPI.          Physical Exam: Healthy appearing patient in no distress.  I cannot appreciate stigmata of chronic liver disease.  Blood pressure  122/68, pulse 58 and regular and weight 267 the BMI of 35.23.  Chest is clear and he is in a regular rhythm without murmurs gallops or rubs.  His abdomen shows no organomegaly, distention, masses or tenderness.  Bowel sounds are normal.  No status is normal.  Peripheral extremities are unremarkable.    Assessment and Plan: Recurrent abdominal pain over the last 10 years of unexplained etiology. Small bowel series was  performed 10 years ago and was unremarkable.  I've scheduled CCK-HIDA scan to exclude biliary dyskinesia, also will check followup endoscopy and colonoscopy to exclude any structural abnormalities accounting for his recurrent and rather severe abdominal pain.  His continue his other medications as listed and reviewed.  Please copy Dr. Mila Palmer No diagnosis found.

## 2012-11-11 NOTE — Patient Instructions (Signed)
You have been scheduled for a colonoscopy and endoscopy with propofol. Please follow written instructions given to you at your visit today.  Please pick up your prep kit at the pharmacy within the next 1-3 days. If you use inhalers (even only as needed), please bring them with you on the day of your procedure. Your physician has requested that you go to www.startemmi.com and enter the access code given to you at your visit today. This web site gives a general overview about your procedure. However, you should still follow specific instructions given to you by our office regarding your preparation for the procedure. _____________________________________________________________________________________________  You have been scheduled for a HIDA scan at [facility]  (1st floor) on 11-20-2012. Please arrive 15 minutes prior to your scheduled appointment on 9 am. Make certain not to have anything to eat or drink at least 6 hours prior to your test. Should this appointment date or time not work well for you, please call radiology scheduling at [phone].  _____________________________________________________________________ hepatobiliary (HIDA) scan is an imaging procedure used to diagnose problems in the liver, gallbladder and bile ducts. In the HIDA scan, a radioactive chemical or tracer is injected into a vein in your arm. The tracer is handled by the liver like bile. Bile is a fluid produced and excreted by your liver that helps your digestive system break down fats in the foods you eat. Bile is stored in your gallbladder and the gallbladder releases the bile when you eat a meal. A special nuclear medicine scanner (gamma camera) tracks the flow of the tracer from your liver into your gallbladder and small intestine.  During your HIDA scan  You'll be asked to change into a hospital gown before your HIDA scan begins. Your health care team will position you on a table, usually on your back. The  radioactive tracer is then injected into a vein in your arm.The tracer travels through your bloodstream to your liver, where it's taken up by the bile-producing cells. The radioactive tracer travels with the bile from your liver into your gallbladder and through your bile ducts to your small intestine.You may feel some pressure while the radioactive tracer is injected into your vein. As you lie on the table, a special gamma camera is positioned over your abdomen taking pictures of the tracer as it moves through your body. The gamma camera takes pictures continually for about an hour. You'll need to keep still during the HIDA scan. This can become uncomfortable, but you may find that you can lessen the discomfort by taking deep breaths and thinking about other things. Tell your health care team if you're uncomfortable. The radiologist will watch on a computer the progress of the radioactive tracer through your body. The HIDA scan may be stopped when the radioactive tracer is seen in the gallbladder and enters your small intestine. This typically takes about an hour. In some cases extra imaging will be performed if original images aren't satisfactory, if morphine is given to help visualize the gallbladder or if the medication CCK is given to look at the contraction of the gallbladder. This test typically takes 2 hours to complete. ________________________________________________________________________

## 2012-11-20 ENCOUNTER — Encounter (HOSPITAL_COMMUNITY)
Admission: RE | Admit: 2012-11-20 | Discharge: 2012-11-20 | Disposition: A | Discharge status: Home / Self Care | Payer: 59 | Source: Ambulatory Visit | Attending: Gastroenterology | Admitting: Gastroenterology

## 2012-11-20 DIAGNOSIS — K5090 Crohn's disease, unspecified, without complications: Secondary | ICD-10-CM | POA: Insufficient documentation

## 2012-11-20 DIAGNOSIS — R110 Nausea: Secondary | ICD-10-CM | POA: Insufficient documentation

## 2012-11-20 DIAGNOSIS — K589 Irritable bowel syndrome without diarrhea: Secondary | ICD-10-CM

## 2012-11-20 DIAGNOSIS — R109 Unspecified abdominal pain: Secondary | ICD-10-CM | POA: Insufficient documentation

## 2012-11-20 MED ORDER — SINCALIDE 5 MCG IJ SOLR
INTRAMUSCULAR | Status: AC
  Administered 2012-11-20: 6.07 ug
  Filled 2012-11-20: qty 5

## 2012-11-20 MED ORDER — SINCALIDE 5 MCG IJ SOLR
INTRAMUSCULAR | Status: AC
  Filled 2012-11-20: qty 5

## 2012-11-20 MED ORDER — TECHNETIUM TC 99M MEBROFENIN IV KIT
5.0000 | PACK | Freq: Once | INTRAVENOUS | Status: AC | PRN
  Administered 2012-11-20: 5 via INTRAVENOUS

## 2012-11-21 ENCOUNTER — Telehealth: Payer: Self-pay | Admitting: Gastroenterology

## 2012-11-21 NOTE — Telephone Encounter (Signed)
Prep resent to pharmacy.  Pt was notified by pharmacist that prep was ready

## 2012-11-24 ENCOUNTER — Ambulatory Visit (AMBULATORY_SURGERY_CENTER): Payer: 59 | Admitting: Gastroenterology

## 2012-11-24 ENCOUNTER — Encounter: Payer: 59 | Admitting: Gastroenterology

## 2012-11-24 ENCOUNTER — Encounter: Payer: Self-pay | Admitting: Gastroenterology

## 2012-11-24 VITALS — BP 135/104 | HR 46 | Temp 98.9°F | Resp 10 | Ht 73.0 in | Wt 267.0 lb

## 2012-11-24 DIAGNOSIS — K589 Irritable bowel syndrome without diarrhea: Secondary | ICD-10-CM

## 2012-11-24 DIAGNOSIS — Z1211 Encounter for screening for malignant neoplasm of colon: Secondary | ICD-10-CM

## 2012-11-24 DIAGNOSIS — K30 Functional dyspepsia: Secondary | ICD-10-CM

## 2012-11-24 DIAGNOSIS — R1031 Right lower quadrant pain: Secondary | ICD-10-CM

## 2012-11-24 DIAGNOSIS — R1013 Epigastric pain: Secondary | ICD-10-CM

## 2012-11-24 DIAGNOSIS — K2980 Duodenitis without bleeding: Secondary | ICD-10-CM

## 2012-11-24 DIAGNOSIS — R1011 Right upper quadrant pain: Secondary | ICD-10-CM

## 2012-11-24 MED ORDER — SODIUM CHLORIDE 0.9 % IV SOLN: 500.0000 mL | INTRAVENOUS | Status: DC

## 2012-11-24 NOTE — Progress Notes (Signed)
Patient did not have preoperative order for IV antibiotic SSI prophylaxis. ([id number])   

## 2012-11-24 NOTE — Progress Notes (Signed)
Called to room to assist during endoscopic procedure.  Patient ID and intended procedure confirmed with present staff. Received instructions for my participation in the procedure from the performing physician.  

## 2012-11-24 NOTE — Patient Instructions (Addendum)
YOU HAD AN ENDOSCOPIC PROCEDURE TODAY AT THE [facility]: Refer to the procedure report that was given to you for any specific questions about what was found during the examination.  If the procedure report does not answer your questions, please call your gastroenterologist to clarify.  If you requested that your care partner not be given the details of your procedure findings, then the procedure report has been included in a sealed envelope for you to review at your convenience later.  YOU SHOULD EXPECT: Some feelings of bloating in the abdomen. Passage of more gas than usual.  Walking can help get rid of the air that was put into your GI tract during the procedure and reduce the bloating. If you had a lower endoscopy (such as a colonoscopy or flexible sigmoidoscopy) you may notice spotting of blood in your stool or on the toilet paper. If you underwent a bowel prep for your procedure, then you may not have a normal bowel movement for a few days.  DIET: Your first meal following the procedure should be a light meal and then it is ok to progress to your normal diet.  A half-sandwich or bowl of soup is an example of a good first meal.  Heavy or fried foods are harder to digest and may make you feel nauseous or bloated.  Likewise meals heavy in dairy and vegetables can cause extra gas to form and this can also increase the bloating.  Drink plenty of fluids but you should avoid alcoholic beverages for 24 hours.  ACTIVITY: Your care partner should take you home directly after the procedure.  You should plan to take it easy, moving slowly for the rest of the day.  You can resume normal activity the day after the procedure however you should NOT DRIVE or use heavy machinery for 24 hours (because of the sedation medicines used during the test).    SYMPTOMS TO REPORT IMMEDIATELY: A gastroenterologist can be reached at any hour.  During normal business hours, 8:30 AM to 5:00 PM Monday through Friday,  call [phone].  After hours and on weekends, please call the GI answering service at [phone] who will take a message and have the physician on call contact you.   Following lower endoscopy (colonoscopy or flexible sigmoidoscopy):  Excessive amounts of blood in the stool  Significant tenderness or worsening of abdominal pains  Swelling of the abdomen that is new, acute  Fever of 100F or higher  Following upper endoscopy (EGD)  Vomiting of blood or coffee ground material  New chest pain or pain under the shoulder blades  Painful or persistently difficult swallowing  New shortness of breath  Fever of 100F or higher  Black, tarry-looking stools  FOLLOW UP: If any biopsies were taken you will be contacted by phone or by letter within the next 1-3 weeks.  Call your gastroenterologist if you have not heard about the biopsies in 3 weeks.  Our staff will call the home number listed on your records the next business day following your procedure to check on you and address any questions or concerns that you may have at that time regarding the information given to you following your procedure. This is a courtesy call and so if there is no answer at the home number and we have not heard from you through the emergency physician on call, we will assume that you have returned to your regular daily activities without incident.  SIGNATURES/CONFIDENTIALITY: You and/or your care   partner have signed paperwork which will be entered into your electronic medical record.  These signatures attest to the fact that that the information above on your After Visit Summary has been reviewed and is understood.  Full responsibility of the confidentiality of this discharge information lies with you and/or your care-partner.  

## 2012-11-24 NOTE — Progress Notes (Signed)
Procedure ends, to recovery, report given and VSS. 

## 2012-11-24 NOTE — Op Note (Signed)
[facility] [address] [place], [address]   ENDOSCOPY PROCEDURE REPORT  PATIENT: Andre, Decker  MR#: [id number] BIRTHDATE: 07-12-1970 , 41  yrs. old GENDER: Male ENDOSCOPIST:David Hale Bogus, MD, [facility] REFERRED BY: PROCEDURE DATE:  11/24/2012 PROCEDURE:   EGD w/ biopsy and EGD w/ biopsy for H.pylori ASA CLASS:    Class II INDICATIONS: Heartburn, Epigastric pain, and Dyspepsia. MEDICATION: There was residual sedation effect present from prior procedure and Propofol (Diprivan) 230 mg IV TOPICAL ANESTHETIC:  DESCRIPTION OF PROCEDURE:   After the risks and benefits of the procedure were explained, informed consent was obtained.  The LB [id number]  endoscope was introduced through the mouth  and advanced to the second portion of the duodenum .  The instrument was slowly withdrawn as the mucosa was fully examined.      DUODENUM: The duodenal mucosa showed no abnormalities in the bulb and second portion of the duodenum.  Cold forceps biopsies were taken in the bulb and second portion.  ESOPHAGUS: The mucosa of the esophagus appeared normal.  STOMACH: The mucosa of the stomach appeared normal.  A CLO biopsy was performed.    Retroflexed views revealed a 3 cm. hiatal hernia. The scope was then withdrawn from the patient and the procedure completed.  COMPLICATIONS: There were no complications.   ENDOSCOPIC IMPRESSION: 1.   The duodenal mucosa showed no abnormalities in the bulb and second portion of the duodenum ..r/o celiac disease 2.   The mucosa of the esophagus appeared normal 3.   The mucosa of the stomach appeared normal; CLO  biopsy done.  RECOMMENDATIONS: 1.  Await biopsy results 2.  Continue current meds 3.  Rx CLO if positive    _______________________________ eSigned:  Mardella Layman, MD, [facility] 11/24/2012 3:41 PM

## 2012-11-24 NOTE — Op Note (Signed)
[facility] [address] [place], [address]   COLONOSCOPY PROCEDURE REPORT  PATIENT: Andre Decker, Andre Decker  MR#: [id number] BIRTHDATE: Dec 24, 1970 , 41  yrs. old GENDER: Male ENDOSCOPIST: Mardella Layman, MD, [facility] REFERRED BY: PROCEDURE DATE:  11/24/2012 PROCEDURE:   Colonoscopy with biopsy ASA CLASS:   Class II INDICATIONS:Colorectal cancer screening. MEDICATIONS: propofol (Diprivan) 300mg  IV  DESCRIPTION OF PROCEDURE:   After the risks and benefits and of the procedure were explained, informed consent was obtained.  A digital rectal exam revealed no abnormalities of the rectum.    The LB [id number]  endoscope was introduced through the anus and advanced to the cecum, which was identified by both the appendix and ileocecal valve .  The quality of the prep was excellent, using MoviPrep .  The instrument was then slowly withdrawn as the colon was fully examined.     COLON FINDINGS: A normal appearing cecum, ileocecal valve, and appendiceal orifice were identified.  The ascending, hepatic flexure, transverse, splenic flexure, descending, sigmoid colon and rectum appeared unremarkable.  No polyps or cancers were seen.  the ileocecal valve was easily intubated and the terminal ileum approximately 20 cm appeared normal.  Multiple mucosal biopsies were obtained for review.   Retroflexed views revealed no abnormalities.     The scope was then withdrawn from the patient and the procedure completed.  COMPLICATIONS: There were no complications. ENDOSCOPIC IMPRESSION: Normal colon ..picture clinically consistent with chronic irritable bowel syndrome.  RECOMMENDATIONS: 1.  Await biopsy results 2.  Continue current medications 3.  Upper endoscopy will be scheduled   REPEAT EXAM:  cc:  _______________________________ eSigned:  Mardella Layman, MD, [facility] 11/24/2012 3:29 PM     PATIENT NAME:  Andre Decker, Andre Decker MR#: [id number]

## 2012-11-25 ENCOUNTER — Telehealth: Payer: Self-pay

## 2012-11-25 ENCOUNTER — Encounter: Payer: Self-pay | Admitting: Gastroenterology

## 2012-11-25 NOTE — Telephone Encounter (Signed)
  Follow up Call-  Call back number 11/24/2012  Post procedure Call Back phone  # [phone]  Permission to leave phone message Yes     Patient questions:  Do you have a fever, pain , or abdominal swelling? no Pain Score  0 *  Have you tolerated food without any problems? yes  Have you been able to return to your normal activities? yes  Do you have any questions about your discharge instructions: Diet   no Medications  no Follow up visit  no  Do you have questions or concerns about your Care? no  Actions: * If pain score is 4 or above: No action needed, pain <4.

## 2012-11-27 ENCOUNTER — Encounter: Payer: Self-pay | Admitting: Gastroenterology

## 2013-01-23 ENCOUNTER — Ambulatory Visit (INDEPENDENT_AMBULATORY_CARE_PROVIDER_SITE_OTHER): Payer: Self-pay | Admitting: Ophthalmology

## 2013-02-03 ENCOUNTER — Telehealth: Payer: Self-pay | Admitting: *Deleted

## 2013-02-03 NOTE — Telephone Encounter (Signed)
Notes Recorded by Mardella Layman, MD on 02/26/2012 at 12:20 PM Benign hemangioma in liver,no change..follow up exam yearly,if not done needs Alpha- feto protein level  Pt had a CT scan 09/02/12 and the hemangioma was stable; also normal HIDA on 11/11/12. Does pt still need a repeat U/S to monitor the hemangioma? Thanks.

## 2013-02-03 NOTE — Telephone Encounter (Signed)
no

## 2013-02-03 NOTE — Telephone Encounter (Signed)
Noted  

## 2013-02-23 ENCOUNTER — Ambulatory Visit (INDEPENDENT_AMBULATORY_CARE_PROVIDER_SITE_OTHER): Payer: Self-pay | Admitting: Ophthalmology

## 2013-03-12 ENCOUNTER — Ambulatory Visit (INDEPENDENT_AMBULATORY_CARE_PROVIDER_SITE_OTHER): Payer: 59 | Admitting: Ophthalmology

## 2013-03-12 DIAGNOSIS — I10 Essential (primary) hypertension: Secondary | ICD-10-CM

## 2013-03-12 DIAGNOSIS — H43819 Vitreous degeneration, unspecified eye: Secondary | ICD-10-CM

## 2013-03-12 DIAGNOSIS — H35039 Hypertensive retinopathy, unspecified eye: Secondary | ICD-10-CM

## 2013-03-12 DIAGNOSIS — H33009 Unspecified retinal detachment with retinal break, unspecified eye: Secondary | ICD-10-CM

## 2013-04-09 ENCOUNTER — Other Ambulatory Visit: Payer: Self-pay

## 2013-07-06 ENCOUNTER — Ambulatory Visit (INDEPENDENT_AMBULATORY_CARE_PROVIDER_SITE_OTHER)
Admission: RE | Admit: 2013-07-06 | Discharge: 2013-07-06 | Disposition: A | Discharge status: Home / Self Care | Payer: 59 | Source: Ambulatory Visit | Attending: Physician Assistant | Admitting: Physician Assistant

## 2013-07-06 ENCOUNTER — Other Ambulatory Visit (INDEPENDENT_AMBULATORY_CARE_PROVIDER_SITE_OTHER): Payer: 59

## 2013-07-06 ENCOUNTER — Ambulatory Visit (INDEPENDENT_AMBULATORY_CARE_PROVIDER_SITE_OTHER): Payer: 59 | Admitting: Physician Assistant

## 2013-07-06 ENCOUNTER — Encounter: Payer: Self-pay | Admitting: Physician Assistant

## 2013-07-06 ENCOUNTER — Telehealth: Payer: Self-pay | Admitting: Gastroenterology

## 2013-07-06 VITALS — BP 100/60 | HR 66 | Temp 99.3°F | Ht 73.0 in

## 2013-07-06 DIAGNOSIS — R1013 Epigastric pain: Secondary | ICD-10-CM

## 2013-07-06 DIAGNOSIS — R112 Nausea with vomiting, unspecified: Secondary | ICD-10-CM

## 2013-07-06 LAB — CBC WITH DIFFERENTIAL/PLATELET
Basophils Absolute: 0 10*3/uL (ref 0.0–0.1)
Basophils Relative: 0.4 % (ref 0.0–3.0)
EOS PCT: 0.1 % (ref 0.0–5.0)
Eosinophils Absolute: 0 10*3/uL (ref 0.0–0.7)
HCT: 42.5 % (ref 39.0–52.0)
Hemoglobin: 13.8 g/dL (ref 13.0–17.0)
LYMPHS PCT: 13.9 % (ref 12.0–46.0)
Lymphs Abs: 0.8 10*3/uL (ref 0.7–4.0)
MCHC: 32.5 g/dL (ref 30.0–36.0)
MCV: 84.3 fl (ref 78.0–100.0)
Monocytes Absolute: 0.4 10*3/uL (ref 0.1–1.0)
Monocytes Relative: 7.3 % (ref 3.0–12.0)
NEUTROS PCT: 78.3 % — AB (ref 43.0–77.0)
Neutro Abs: 4.3 10*3/uL (ref 1.4–7.7)
Platelets: 170 10*3/uL (ref 150.0–400.0)
RBC: 5.05 Mil/uL (ref 4.22–5.81)
RDW: 13.4 % (ref 11.5–14.6)
WBC: 5.5 10*3/uL (ref 4.5–10.5)

## 2013-07-06 LAB — COMPREHENSIVE METABOLIC PANEL
ALBUMIN: 3.9 g/dL (ref 3.5–5.2)
ALT: 21 U/L (ref 0–53)
AST: 21 U/L (ref 0–37)
Alkaline Phosphatase: 39 U/L (ref 39–117)
BUN: 12 mg/dL (ref 6–23)
CO2: 25 mEq/L (ref 19–32)
Calcium: 9.1 mg/dL (ref 8.4–10.5)
Chloride: 108 mEq/L (ref 96–112)
Creatinine, Ser: 1.1 mg/dL (ref 0.4–1.5)
GFR: 91.84 mL/min (ref >60.00)
GLUCOSE: 94 mg/dL (ref 70–99)
POTASSIUM: 3.7 meq/L (ref 3.5–5.1)
SODIUM: 141 meq/L (ref 135–145)
TOTAL PROTEIN: 7 g/dL (ref 6.0–8.3)
Total Bilirubin: 1.9 mg/dL — ABNORMAL HIGH (ref 0.3–1.2)

## 2013-07-06 LAB — LIPASE: LIPASE: 15 U/L (ref 11.0–59.0)

## 2013-07-06 MED ORDER — HYDROCODONE-ACETAMINOPHEN 5-325 MG PO TABS: ORAL_TABLET | ORAL | Status: DC

## 2013-07-06 MED ORDER — IOHEXOL 300 MG/ML  SOLN
100.0000 mL | Freq: Once | INTRAMUSCULAR | Status: AC | PRN
  Administered 2013-07-06: 100 mL via INTRAVENOUS

## 2013-07-06 MED ORDER — ONDANSETRON HCL 4 MG PO TABS: ORAL_TABLET | ORAL | Status: DC

## 2013-07-06 MED ORDER — OMEPRAZOLE-SODIUM BICARBONATE 40-1100 MG PO CAPS: 1.0000 | ORAL_CAPSULE | Freq: Every day | ORAL | Status: DC

## 2013-07-06 NOTE — Progress Notes (Signed)
Subjective:    Patient ID: Andre Decker, male    DOB: December 31, 1970, 43 y.o.   MRN: [id number]  HPI  Andre Decker is a pleasant 43 year old Afro-American male known to [name] who has history of recurrent episodes of upper abdominal pain over the past several years without any clear diagnosis. He comes in today with onset of acute pain last evening after eating dinner. He says this is his usual pain but worse. He describes it as a sharp "tight" feeling in his upper abdomen without radiation he does have associated nausea and vomiting. He has not tried to eat anything today other than water which he did keep down. He has not had any fever or chills or diaphoresis. He had one episode of diarrhea earlier today nonbloody. Denies any dysuria urgency or frequency. He has not been on any regular aspirin or NSAIDs. No new medicines denies any EtOH or drug use. He is on an ACE inhibitor for blood pressure no changes in dosages. Patient had a similar but less severe episode this past summer and did undergo endoscopy and colonoscopy in June of 2014 both of which were negative. He had small bowel biopsies done which were negative for evidence of sprue and there was no evidence of H. pylori on gastric biopsies. CT scan was done in April of 2014 and was unremarkable last ultrasound 2010 was negative. He also had a [facility] HIDA scan done in June of 2014 with a normal ejection fraction. He says in between these episodes he generally feels fine. Most of his episodes have lasted a couple of days and then resolved.    Review of Systems  Constitutional: Positive for appetite change.  HENT: Negative.   Eyes: Negative.   Respiratory: Negative.   Cardiovascular: Negative.   Gastrointestinal: Positive for nausea, vomiting and abdominal pain.  Endocrine: Negative.   Genitourinary: Negative.   Musculoskeletal: Negative.   Skin: Negative.   Allergic/Immunologic: Negative.   Neurological: Negative.   Hematological:  Negative.   Psychiatric/Behavioral: Negative.    Outpatient Prescriptions Prior to Visit  Medication Sig Dispense Refill  . brimonidine-timolol (COMBIGAN) 0.2-0.5 % ophthalmic solution Place 1 drop into both eyes every 12 (twelve) hours.      Marland Kitchen lisinopril-hydrochlorothiazide (PRINZIDE,ZESTORETIC) 20-12.5 MG per tablet Take 1 tablet by mouth daily.        . travoprost, benzalkonium, (TRAVATAN) 0.004 % ophthalmic solution Place 1 drop into both eyes at bedtime.       Marland Kitchen HYDROcodone-acetaminophen (NORCO/VICODIN) 5-325 MG per tablet Take 2 tablets by mouth every 4 (four) hours as needed for pain.  15 tablet  0  . ondansetron (ZOFRAN) 4 MG tablet Take 1 tablet (4 mg total) by mouth every 8 (eight) hours as needed for nausea.  10 tablet  0   No facility-administered medications prior to visit.   No Known Allergies Patient Active Problem List   Diagnosis Date Noted  . IRRITABLE BOWEL SYNDROME 05/23/2010  . OBESITY, UNSPECIFIED 03/15/2009  . HEMANGIOMA, HEPATIC 09/06/2007  . HYPERTENSION 09/06/2007   History  Substance Use Topics  . Smoking status: Never Smoker   . Smokeless tobacco: Never Used  . Alcohol Use: No   family history includes Migraines in his mother; Prostate cancer in his maternal grandfather and maternal uncle. There is no history of Colon cancer.    Objective:   Physical Exam  well-developed after American male in no acute distress, uncomfortable appearing lying on the exam table blood pressure 100/60 pulse 68 temp  99 3 height 6 foot 1. HEENT; nontraumatic normocephalic EOMI PERRLA sclera anicteric, Supple; no JVD, Cardiovascular ;regular rate and rhythm with S1-S2 no murmur or gallop, Pulmonary ;clear bilaterally, Abdomen; soft bowel sounds are hypoactive he is tender to palpation across the upper abdomen there is some voluntary guarding no rebound no palpable mass or hepatosplenomegaly, Rectal; exam not done, Ext; no clubbing cyanosis or edema skin warm and dry, Psych; mood and  affect appropriate        Assessment & Plan:  #53  43 year old African American male with episodic upper abdominal pain associated with nausea and vomiting. Patient has had similar episodes over the past 10 years generally lasting a couple of days with resolution and occurring at least a couple of times per year. GI workup to date has been unrevealing Will rule out intussusception, also consider ACE induced angioedema  #2 hypertension #3 glaucoma  Plan; CBC, CMET and lipase today, C1 esterase level CT scan of the abdomen and pelvis with contrast today Start with clear liquid diet and gradually advance to soft bland diet Zegerid 40 mg by mouth every morning, samples given one daily x2 weeks Zofran 4 mg every 6 hours as needed for nausea Vicodin 5/325 one every 4-6 hours as needed for pain #25 no refills Note for work to cover patient for today and tomorrow Further plans pending results of labs and CT

## 2013-07-06 NOTE — Telephone Encounter (Signed)
Pt with a hx of abdominal and IBS last seen 11/11/12. He had ECL on 11/24/12 that was normal except for chronic peptic duodenitis. Pt reports he is having the same abdominal pain only worse, the pain is located in the middle of his rib cage. He states his BMs are normal. Pt will see Nicoletta Ba, PA today.

## 2013-07-06 NOTE — Patient Instructions (Signed)
We sent a prescription for Zofran for nausea to [facility].   We have given you a prescription for Vicodin for pain you will have to present to the pharmacist. We cannot sent that one. We have given you a work note for today and tomorrow.   You have been scheduled for a CT scan of the abdomen and pelvis at [facility] ([address]---this is in the same building as Press [employer]).   You are scheduled today 2-2  at 4:15 am  You should arrive about 4:00 PM  prior to your appointment time for registration. Please follow the written instructions below on the day of your exam:  WARNING: IF YOU ARE ALLERGIC TO IODINE/X-RAY DYE, PLEASE NOTIFY RADIOLOGY IMMEDIATELY AT [phone]! YOU WILL BE GIVEN A 13 HOUR PREMEDICATION PREP.  1) Do not eat or drink anything until after the test. 2) You have been given 2 bottles of oral contrast to drink.  Drink 1 bottle of contrast $RemoveBef'@3'BlrRdJmChp$ :15 (2 hours prior to your exam)  Drink 1 bottle of contrast @ 3:45 PM  (1 hour prior to your exam)  You may take any medications as prescribed with a small amount of water except for the following: Metformin, Glucophage, Glucovance, Avandamet, Riomet, Fortamet, Actoplus Met, Janumet, Glumetza or Metaglip. The above medications must be held the day of the exam AND 48 hours after the exam.  The purpose of you drinking the oral contrast is to aid in the visualization of your intestinal tract. The contrast solution may cause some diarrhea. Before your exam is started, you will be given a small amount of fluid to drink. Depending on your individual set of symptoms, you may also receive an intravenous injection of x-ray contrast/dye. Plan on being at [facility] for 30 minutes or long, depending on the type of exam you are having performed.  If you have any questions regarding your exam or if you need to reschedule, you may call the CT department at [phone] between the hours of 8:00 am and  5:00 pm, Monday-Friday.  ________________________________________________________________________

## 2013-07-10 LAB — C1 ESTERASE INHIBITOR, SERUM: C1INH Functional/C1INH Total MFr SerPl: 24 mg/dL (ref 21–39)

## 2013-07-17 ENCOUNTER — Encounter (HOSPITAL_COMMUNITY): Payer: Self-pay | Admitting: Emergency Medicine

## 2013-07-17 ENCOUNTER — Emergency Department (HOSPITAL_COMMUNITY): Payer: 59

## 2013-07-17 ENCOUNTER — Emergency Department (HOSPITAL_COMMUNITY)
Admission: EM | Admit: 2013-07-17 | Discharge: 2013-07-18 | Disposition: A | Discharge status: Home / Self Care | Payer: 59 | Attending: Emergency Medicine | Admitting: Emergency Medicine

## 2013-07-17 DIAGNOSIS — S0993XA Unspecified injury of face, initial encounter: Secondary | ICD-10-CM | POA: Insufficient documentation

## 2013-07-17 DIAGNOSIS — Y9389 Activity, other specified: Secondary | ICD-10-CM | POA: Insufficient documentation

## 2013-07-17 DIAGNOSIS — Y92410 Unspecified street and highway as the place of occurrence of the external cause: Secondary | ICD-10-CM | POA: Insufficient documentation

## 2013-07-17 DIAGNOSIS — Z860100 Personal history of colon polyps, unspecified: Secondary | ICD-10-CM | POA: Insufficient documentation

## 2013-07-17 DIAGNOSIS — I10 Essential (primary) hypertension: Secondary | ICD-10-CM | POA: Insufficient documentation

## 2013-07-17 DIAGNOSIS — S0990XA Unspecified injury of head, initial encounter: Secondary | ICD-10-CM | POA: Insufficient documentation

## 2013-07-17 DIAGNOSIS — S199XXA Unspecified injury of neck, initial encounter: Secondary | ICD-10-CM

## 2013-07-17 DIAGNOSIS — Z79899 Other long term (current) drug therapy: Secondary | ICD-10-CM | POA: Insufficient documentation

## 2013-07-17 DIAGNOSIS — Z8719 Personal history of other diseases of the digestive system: Secondary | ICD-10-CM | POA: Insufficient documentation

## 2013-07-17 DIAGNOSIS — IMO0002 Reserved for concepts with insufficient information to code with codable children: Secondary | ICD-10-CM | POA: Insufficient documentation

## 2013-07-17 DIAGNOSIS — H409 Unspecified glaucoma: Secondary | ICD-10-CM | POA: Insufficient documentation

## 2013-07-17 MED ORDER — MORPHINE SULFATE 4 MG/ML IJ SOLN
4.0000 mg | Freq: Once | INTRAMUSCULAR | Status: AC
  Administered 2013-07-17: 4 mg via INTRAVENOUS
  Filled 2013-07-17: qty 1

## 2013-07-17 MED ORDER — ONDANSETRON HCL 4 MG/2ML IJ SOLN
4.0000 mg | Freq: Once | INTRAMUSCULAR | Status: AC
  Administered 2013-07-17: 4 mg via INTRAVENOUS
  Filled 2013-07-17: qty 2
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
## 2013-07-17 NOTE — ED Notes (Signed)
Patient transported to X-ray 
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
## 2013-07-18 MED ORDER — METHOCARBAMOL 500 MG PO TABS: 500.0000 mg | ORAL_TABLET | Freq: Two times a day (BID) | ORAL | Status: DC

## 2013-07-18 MED ORDER — MORPHINE SULFATE 4 MG/ML IJ SOLN
4.0000 mg | Freq: Once | INTRAMUSCULAR | Status: AC
  Administered 2013-07-18: 4 mg via INTRAVENOUS
  Filled 2013-07-18: qty 1

## 2013-07-18 MED ORDER — IBUPROFEN 800 MG PO TABS: 800.0000 mg | ORAL_TABLET | Freq: Three times a day (TID) | ORAL | Status: DC
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
## 2013-07-18 NOTE — ED Provider Notes (Signed)
Medical screening examination/treatment/procedure(s) were performed by non-physician practitioner and as supervising physician I was immediately available for consultation/collaboration.    Teressa Lower, MD 07/18/13 [phone]

## 2014-03-24 ENCOUNTER — Ambulatory Visit (INDEPENDENT_AMBULATORY_CARE_PROVIDER_SITE_OTHER): Payer: 59 | Admitting: Ophthalmology

## 2014-04-05 ENCOUNTER — Ambulatory Visit (INDEPENDENT_AMBULATORY_CARE_PROVIDER_SITE_OTHER): Payer: 59 | Admitting: Ophthalmology

## 2014-04-05 DIAGNOSIS — I10 Essential (primary) hypertension: Secondary | ICD-10-CM

## 2014-04-05 DIAGNOSIS — H35033 Hypertensive retinopathy, bilateral: Secondary | ICD-10-CM

## 2014-04-05 DIAGNOSIS — H4011X2 Primary open-angle glaucoma, moderate stage: Secondary | ICD-10-CM

## 2014-04-05 DIAGNOSIS — H43813 Vitreous degeneration, bilateral: Secondary | ICD-10-CM

## 2014-06-11 ENCOUNTER — Emergency Department (HOSPITAL_COMMUNITY)
Admission: EM | Admit: 2014-06-11 | Discharge: 2014-06-11 | Disposition: A | Discharge status: Home / Self Care | Payer: 59 | Attending: Emergency Medicine | Admitting: Emergency Medicine

## 2014-06-11 ENCOUNTER — Encounter (HOSPITAL_COMMUNITY): Payer: Self-pay | Admitting: *Deleted

## 2014-06-11 ENCOUNTER — Emergency Department (HOSPITAL_COMMUNITY): Payer: 59

## 2014-06-11 DIAGNOSIS — Z8669 Personal history of other diseases of the nervous system and sense organs: Secondary | ICD-10-CM | POA: Insufficient documentation

## 2014-06-11 DIAGNOSIS — H409 Unspecified glaucoma: Secondary | ICD-10-CM | POA: Insufficient documentation

## 2014-06-11 DIAGNOSIS — I10 Essential (primary) hypertension: Secondary | ICD-10-CM | POA: Insufficient documentation

## 2014-06-11 DIAGNOSIS — R0602 Shortness of breath: Secondary | ICD-10-CM | POA: Insufficient documentation

## 2014-06-11 DIAGNOSIS — Z8719 Personal history of other diseases of the digestive system: Secondary | ICD-10-CM | POA: Insufficient documentation

## 2014-06-11 DIAGNOSIS — Z791 Long term (current) use of non-steroidal anti-inflammatories (NSAID): Secondary | ICD-10-CM | POA: Insufficient documentation

## 2014-06-11 DIAGNOSIS — Z86010 Personal history of colonic polyps: Secondary | ICD-10-CM | POA: Insufficient documentation

## 2014-06-11 DIAGNOSIS — R531 Weakness: Secondary | ICD-10-CM | POA: Insufficient documentation

## 2014-06-11 DIAGNOSIS — J029 Acute pharyngitis, unspecified: Secondary | ICD-10-CM | POA: Insufficient documentation

## 2014-06-11 DIAGNOSIS — Z79899 Other long term (current) drug therapy: Secondary | ICD-10-CM | POA: Diagnosis not present

## 2014-06-11 DIAGNOSIS — R6889 Other general symptoms and signs: Secondary | ICD-10-CM

## 2014-06-11 LAB — CBC
HCT: 42.6 % (ref 39.0–52.0)
Hemoglobin: 14.4 g/dL (ref 13.0–17.0)
MCH: 27.5 pg (ref 26.0–34.0)
MCHC: 33.8 g/dL (ref 30.0–36.0)
MCV: 81.3 fL (ref 78.0–100.0)
Platelets: 164 K/uL (ref 150–400)
RBC: 5.24 MIL/uL (ref 4.22–5.81)
RDW: 13.3 % (ref 11.5–15.5)
WBC: 9.2 K/uL (ref 4.0–10.5)

## 2014-06-11 LAB — BASIC METABOLIC PANEL
Anion gap: 10 (ref 5–15)
BUN: 10 mg/dL (ref 6–23)
CO2: 27 mmol/L (ref 19–32)
CREATININE: 1.3 mg/dL (ref 0.50–1.35)
Calcium: 9.7 mg/dL (ref 8.4–10.5)
Chloride: 103 mEq/L (ref 96–112)
GFR calc non Af Amer: 66 mL/min — ABNORMAL LOW (ref >90)
GFR, EST AFRICAN AMERICAN: 77 mL/min — AB (ref >90)
Glucose, Bld: 100 mg/dL — ABNORMAL HIGH (ref 70–99)
POTASSIUM: 3.7 mmol/L (ref 3.5–5.1)
SODIUM: 140 mmol/L (ref 135–145)

## 2014-06-11 LAB — RAPID STREP SCREEN (MED CTR MEBANE ONLY): Streptococcus, Group A Screen (Direct): NEGATIVE

## 2014-06-11 LAB — I-STAT TROPONIN, ED: Troponin i, poc: 0.01 ng/mL (ref 0.00–0.08)

## 2014-06-11 LAB — BRAIN NATRIURETIC PEPTIDE: B Natriuretic Peptide: 17.4 pg/mL (ref 0.0–100.0)

## 2014-06-11 MED ORDER — HYDROCOD POLST-CHLORPHEN POLST 10-8 MG/5ML PO LQCR: 5.0000 mL | Freq: Two times a day (BID) | ORAL | Status: DC | PRN

## 2014-06-11 MED ORDER — HYDROCODONE-ACETAMINOPHEN 7.5-325 MG/15ML PO SOLN
10.0000 mL | Freq: Once | ORAL | Status: AC
  Administered 2014-06-11: 10 mL via ORAL
  Filled 2014-06-11: qty 15

## 2014-06-11 MED ORDER — OSELTAMIVIR PHOSPHATE 75 MG PO CAPS: 75.0000 mg | ORAL_CAPSULE | Freq: Two times a day (BID) | ORAL | Status: DC

## 2014-06-11 NOTE — ED Notes (Signed)
Patient was given a cup of water and gram crackers.

## 2014-06-11 NOTE — ED Notes (Signed)
Patient states he began to feel bad last night.  He is having sob, weakness, cough, and body aches.  Patient tried otc meds last night w/o relief.  Patient is alert.  He is feeling sob at rest.

## 2014-06-11 NOTE — ED Provider Notes (Signed)
CSN: [id number]     Arrival date & time 06/11/14  1231 History   First MD Initiated Contact with Patient 06/11/14 1501     Chief Complaint  Patient presents with  . Shortness of Breath  . Cough  . Fatigue  . Generalized Body Aches     (Consider location/radiation/quality/duration/timing/severity/associated sxs/prior Treatment) Patient is a 44 y.o. male presenting with shortness of breath and cough. The history is provided by the patient and medical records.  Shortness of Breath Associated symptoms: cough and sore throat   Cough Associated symptoms: myalgias, shortness of breath and sore throat     This is a 44 year old male with past medical history significant for hypertension, Crohn's disease, sleep apnea, presenting to the ED for flulike symptoms. Specifically patient has had dry cough, sore throat, generalized weakness, body aches, and shortness of breath. He states shortness of breath is worse with coughing. He denies any significant chest pain. Endorses subjective fever and chills.  Patient denies any recent sick contacts. He did not receive a flu shot this year.  Patient has been taking multiple over-the-counter cough and cold medications without noted relief.  VS stable on arrival.  Past Medical History  Diagnosis Date  . Hypertension   . Glaucoma   . Colon polyps   . Crohn disease 1995  . Sleep apnea    Past Surgical History  Procedure Laterality Date  . Eye surgery Bilateral     part. detached retina  . Cataract extraction Left     lens inplant   Family History  Problem Relation Age of Onset  . Migraines Mother   . Colon cancer Neg Hx   . Prostate cancer Maternal Grandfather   . Prostate cancer Maternal Uncle    History  Substance Use Topics  . Smoking status: Never Smoker   . Smokeless tobacco: Never Used  . Alcohol Use: No    Review of Systems  HENT: Positive for congestion and sore throat.   Respiratory: Positive for cough and shortness of breath.    Musculoskeletal: Positive for myalgias.  All other systems reviewed and are negative.     Allergies  Review of patient's allergies indicates no known allergies.  Home Medications   Prior to Admission medications   Medication Sig Start Date End Date Taking? Authorizing Provider  brimonidine-timolol (COMBIGAN) 0.2-0.5 % ophthalmic solution Place 1 drop into both eyes at bedtime.     Historical Provider, MD  HYDROcodone-acetaminophen (NORCO/VICODIN) 5-325 MG per tablet Take 1 tab every 6 hours as needed for pain 07/06/13   Amy S Esterwood, PA-C  ibuprofen (ADVIL,MOTRIN) 800 MG tablet Take 1 tablet (800 mg total) by mouth 3 (three) times daily. 07/18/13   Domenic Moras, PA-C  lisinopril-hydrochlorothiazide (PRINZIDE,ZESTORETIC) 20-12.5 MG per tablet Take 1 tablet by mouth daily.     Historical Provider, MD  methocarbamol (ROBAXIN) 500 MG tablet Take 1 tablet (500 mg total) by mouth 2 (two) times daily. 07/18/13   Domenic Moras, PA-C  omeprazole-sodium bicarbonate (ZEGERID) 40-1100 MG per capsule Take 1 capsule by mouth daily before breakfast. 07/06/13   Amy S Esterwood, PA-C  ondansetron (ZOFRAN) 4 MG tablet Take 1 tab every 6 hours as needed for nausea. 07/06/13   Amy S Esterwood, PA-C  travoprost, benzalkonium, (TRAVATAN) 0.004 % ophthalmic solution Place 1 drop into both eyes at bedtime.     Historical Provider, MD   BP 122/62 mmHg  Pulse 67  Temp(Src) 99.3 F (37.4 C) (Oral)  Resp 25  Ht  6\' 1"  (1.854 m)  Wt 270 lb (122.471 kg)  BMI 35.63 kg/m2  SpO2 97%   Physical Exam  Constitutional: He is oriented to person, place, and time. He appears well-developed and well-nourished.  HENT:  Head: Normocephalic and atraumatic.  Right Ear: Tympanic membrane and ear canal normal.  Left Ear: Tympanic membrane and ear canal normal.  Nose: Mucosal edema present.  Mouth/Throat: Uvula is midline, oropharynx is clear and moist and mucous membranes are normal.  Tonsils erythematous and 1+ bilaterally without  exudate; uvula midline without peritonsillar abscess; handling secretions appropriately; no difficulty swallowing or speaking  Eyes: Conjunctivae and EOM are normal. Pupils are equal, round, and reactive to light.  Neck: Normal range of motion.  Cardiovascular: Normal rate, regular rhythm and normal heart sounds.   Pulmonary/Chest: Effort normal and breath sounds normal. He has no wheezes. He has no rhonchi.  Respirations unlabored, lungs clear bilaterally, speaking in full complete sentences without difficulty, O2 sats 97%  Abdominal: Soft. Bowel sounds are normal.  Musculoskeletal: Normal range of motion.  Neurological: He is alert and oriented to person, place, and time.  Skin: Skin is warm and dry.  Psychiatric: He has a normal mood and affect.  Nursing note and vitals reviewed.   ED Course  Procedures (including critical care time) Labs Review Labs Reviewed  BASIC METABOLIC PANEL - Abnormal; Notable for the following:    Glucose, Bld 100 (*)    GFR calc non Af Amer 66 (*)    GFR calc Af Amer 77 (*)    All other components within normal limits  RAPID STREP SCREEN  CULTURE, GROUP A STREP  CBC  BRAIN NATRIURETIC PEPTIDE  I-STAT TROPOININ, ED    Imaging Review Dg Chest 2 View  06/11/2014   CLINICAL DATA:  Weakness and shortness of breath for 1 day  EXAM: CHEST  2 VIEW  COMPARISON:  09/02/2012  FINDINGS: The overall inspiratory effort is poor although the lungs remain clear. The cardiac shadow is stable. No focal infiltrate or sizable effusion is seen. No bony abnormality is noted.  IMPRESSION: Poor inspiratory effort although no focal abnormality is seen.   Electronically Signed   By: Inez Catalina M.D.   On: 06/11/2014 15:09     EKG Interpretation None      MDM   Final diagnoses:  Flu-like symptoms  Shortness of breath   44 year old male with flulike symptoms for the past 24 hours. On exam, patient afebrile and nontoxic in appearance. His respirations are unlabored and  lungs are clear bilaterally.  EKG sinus rhythm without ischemic changes. Lab work is reassuring. Chest x-ray is clear.  VS have remained stable on RA, patient has not required any supplemental oxygen, no signs of respiratory distress.  Constellation of symptoms likely viral in nature, possibly influenza. Patient started on Tamiflu and Tussionex. He was instructed to continue supportive care home including fluids and rest. He will follow with his primary care physician.  Discussed plan with patient, he/she acknowledged understanding and agreed with plan of care.  Return precautions given for new or worsening symptoms.  Larene Pickett, PA-C 06/11/14 [place], MD 06/11/14 1740

## 2014-06-11 NOTE — Discharge Instructions (Signed)
Take the prescribed medication as directed.  tamiflu may cause diarrhea-- recommend taking with food. Follow-up with your primary care physician. Return to the ED for new or worsening symptoms.

## 2014-06-13 LAB — CULTURE, GROUP A STREP

## 2014-06-14 ENCOUNTER — Encounter (HOSPITAL_COMMUNITY): Payer: Self-pay | Admitting: Emergency Medicine

## 2014-06-14 ENCOUNTER — Emergency Department (HOSPITAL_COMMUNITY)
Admission: EM | Admit: 2014-06-14 | Discharge: 2014-06-14 | Disposition: A | Discharge status: Home / Self Care | Payer: 59 | Attending: Emergency Medicine | Admitting: Emergency Medicine

## 2014-06-14 ENCOUNTER — Emergency Department (HOSPITAL_COMMUNITY): Payer: 59

## 2014-06-14 DIAGNOSIS — Z79891 Long term (current) use of opiate analgesic: Secondary | ICD-10-CM | POA: Diagnosis not present

## 2014-06-14 DIAGNOSIS — J069 Acute upper respiratory infection, unspecified: Secondary | ICD-10-CM | POA: Diagnosis not present

## 2014-06-14 DIAGNOSIS — Z791 Long term (current) use of non-steroidal anti-inflammatories (NSAID): Secondary | ICD-10-CM | POA: Insufficient documentation

## 2014-06-14 DIAGNOSIS — I10 Essential (primary) hypertension: Secondary | ICD-10-CM | POA: Insufficient documentation

## 2014-06-14 DIAGNOSIS — R059 Cough, unspecified: Secondary | ICD-10-CM

## 2014-06-14 DIAGNOSIS — R0602 Shortness of breath: Secondary | ICD-10-CM | POA: Diagnosis present

## 2014-06-14 DIAGNOSIS — Z86010 Personal history of colonic polyps: Secondary | ICD-10-CM | POA: Insufficient documentation

## 2014-06-14 DIAGNOSIS — Z8719 Personal history of other diseases of the digestive system: Secondary | ICD-10-CM | POA: Diagnosis not present

## 2014-06-14 DIAGNOSIS — H409 Unspecified glaucoma: Secondary | ICD-10-CM | POA: Diagnosis not present

## 2014-06-14 DIAGNOSIS — R062 Wheezing: Secondary | ICD-10-CM

## 2014-06-14 DIAGNOSIS — R05 Cough: Secondary | ICD-10-CM

## 2014-06-14 DIAGNOSIS — R0981 Nasal congestion: Secondary | ICD-10-CM

## 2014-06-14 DIAGNOSIS — Z79899 Other long term (current) drug therapy: Secondary | ICD-10-CM | POA: Diagnosis not present

## 2014-06-14 LAB — I-STAT TROPONIN, ED: Troponin i, poc: 0 ng/mL (ref 0.00–0.08)

## 2014-06-14 LAB — CBC WITH DIFFERENTIAL/PLATELET
BASOS PCT: 0 % (ref 0–1)
Basophils Absolute: 0 10*3/uL (ref 0.0–0.1)
Eosinophils Absolute: 0.2 10*3/uL (ref 0.0–0.7)
Eosinophils Relative: 2 % (ref 0–5)
HEMATOCRIT: 38.3 % — AB (ref 39.0–52.0)
Hemoglobin: 13.2 g/dL (ref 13.0–17.0)
LYMPHS ABS: 2.2 10*3/uL (ref 0.7–4.0)
Lymphocytes Relative: 29 % (ref 12–46)
MCH: 27.2 pg (ref 26.0–34.0)
MCHC: 34.5 g/dL (ref 30.0–36.0)
MCV: 79 fL (ref 78.0–100.0)
MONOS PCT: 11 % (ref 3–12)
Monocytes Absolute: 0.8 10*3/uL (ref 0.1–1.0)
NEUTROS PCT: 58 % (ref 43–77)
Neutro Abs: 4.3 10*3/uL (ref 1.7–7.7)
Platelets: 202 10*3/uL (ref 150–400)
RBC: 4.85 MIL/uL (ref 4.22–5.81)
RDW: 12.6 % (ref 11.5–15.5)
WBC: 7.5 10*3/uL (ref 4.0–10.5)

## 2014-06-14 LAB — BASIC METABOLIC PANEL
Anion gap: 11 (ref 5–15)
BUN: 8 mg/dL (ref 6–23)
CALCIUM: 9.1 mg/dL (ref 8.4–10.5)
CO2: 21 mmol/L (ref 19–32)
Chloride: 106 mEq/L (ref 96–112)
Creatinine, Ser: 1.22 mg/dL (ref 0.50–1.35)
GFR calc Af Amer: 83 mL/min — ABNORMAL LOW (ref >90)
GFR calc non Af Amer: 72 mL/min — ABNORMAL LOW (ref >90)
Glucose, Bld: 96 mg/dL (ref 70–99)
Potassium: 3.9 mmol/L (ref 3.5–5.1)
SODIUM: 138 mmol/L (ref 135–145)

## 2014-06-14 LAB — BRAIN NATRIURETIC PEPTIDE: B Natriuretic Peptide: 75.4 pg/mL (ref 0.0–100.0)

## 2014-06-14 MED ORDER — ALBUTEROL SULFATE (2.5 MG/3ML) 0.083% IN NEBU
5.0000 mg | INHALATION_SOLUTION | Freq: Once | RESPIRATORY_TRACT | Status: AC
  Administered 2014-06-14: 5 mg via RESPIRATORY_TRACT
  Filled 2014-06-14: qty 6

## 2014-06-14 MED ORDER — LEVOFLOXACIN 750 MG PO TABS: 750.0000 mg | ORAL_TABLET | Freq: Every day | ORAL | Status: DC

## 2014-06-14 MED ORDER — IPRATROPIUM BROMIDE 0.02 % IN SOLN
0.5000 mg | Freq: Once | RESPIRATORY_TRACT | Status: AC
  Administered 2014-06-14: 0.5 mg via RESPIRATORY_TRACT
  Filled 2014-06-14: qty 2.5

## 2014-06-14 MED ORDER — ALBUTEROL SULFATE HFA 108 (90 BASE) MCG/ACT IN AERS: 2.0000 | INHALATION_SPRAY | RESPIRATORY_TRACT | Status: DC | PRN

## 2014-06-14 NOTE — Discharge Instructions (Signed)
Continue to stay well-hydrated. Continue to alternate between Tylenol and Ibuprofen for pain or fever. Use Mucinex for cough suppression/expectoration of mucus. Use netipot and flonase to help with nasal congestion. May consider over-the-counter Benadryl or other antihistamine to decrease secretions and for watery itchy eyes. Use inhaler as directed, as needed for cough/chest congestion. Take antibiotic as directed. Followup with your primary care doctor in 3-5 days for recheck of ongoing symptoms. Return to emergency department for emergent changing or worsening of symptoms.    Cough, Adult  A cough is a reflex. It helps you clear your throat and airways. A cough can help heal your body. A cough can last 2 or 3 weeks (acute) or may last more than 8 weeks (chronic). Some common causes of a cough can include an infection, allergy, or a cold. HOME CARE  Only take medicine as told by your doctor.  If given, take your medicines (antibiotics) as told. Finish them even if you start to feel better.  Use a cold steam vaporizer or humidifier in your home. This can help loosen thick spit (secretions).  Sleep so you are almost sitting up (semi-upright). Use pillows to do this. This helps reduce coughing.  Rest as needed.  Stop smoking if you smoke. GET HELP RIGHT AWAY IF:  You have yellowish-white fluid (pus) in your thick spit.  Your cough gets worse.  Your medicine does not reduce coughing, and you are losing sleep.  You cough up blood.  You have trouble breathing.  Your pain gets worse and medicine does not help.  You have a fever. MAKE SURE YOU:   Understand these instructions.  Will watch your condition.  Will get help right away if you are not doing well or get worse. Document Released: 02/01/2011 Document Revised: 10/05/2013 Document Reviewed: 02/01/2011 [facility] Patient Information 2015 [place], [place]. This information is not intended to replace advice given to you by your  health care provider. Make sure you discuss any questions you have with your health care provider.  How to Use an Inhaler Proper inhaler technique is very important. Good technique ensures that the medicine reaches the lungs. Poor technique results in depositing the medicine on the tongue and back of the throat rather than in the airways. If you do not use the inhaler with good technique, the medicine will not help you. STEPS TO FOLLOW IF USING AN INHALER WITHOUT AN EXTENSION TUBE  Remove the cap from the inhaler.  If you are using the inhaler for the first time, you will need to prime it. Shake the inhaler for 5 seconds and release four puffs into the air, away from your face. Ask your health care provider or pharmacist if you have questions about priming your inhaler.  Shake the inhaler for 5 seconds before each breath in (inhalation).  Position the inhaler so that the top of the canister faces up.  Put your index finger on the top of the medicine canister. Your thumb supports the bottom of the inhaler.  Open your mouth.  Either place the inhaler between your teeth and place your lips tightly around the mouthpiece, or hold the inhaler 1-2 inches away from your open mouth. If you are unsure of which technique to use, ask your health care provider.  Breathe out (exhale) normally and as completely as possible.  Press the canister down with your index finger to release the medicine.  At the same time as the canister is pressed, inhale deeply and slowly until your lungs  are completely filled. This should take 4-6 seconds. Keep your tongue down.  Hold the medicine in your lungs for 5-10 seconds (10 seconds is best). This helps the medicine get into the small airways of your lungs.  Breathe out slowly, through pursed lips. Whistling is an example of pursed lips.  Wait at least 15-30 seconds between puffs. Continue with the above steps until you have taken the number of puffs your health care  provider has ordered. Do not use the inhaler more than your health care provider tells you.  Replace the cap on the inhaler.  Follow the directions from your health care provider or the inhaler insert for cleaning the inhaler. STEPS TO FOLLOW IF USING AN INHALER WITH AN EXTENSION (SPACER)  Remove the cap from the inhaler.  If you are using the inhaler for the first time, you will need to prime it. Shake the inhaler for 5 seconds and release four puffs into the air, away from your face. Ask your health care provider or pharmacist if you have questions about priming your inhaler.  Shake the inhaler for 5 seconds before each breath in (inhalation).  Place the open end of the spacer onto the mouthpiece of the inhaler.  Position the inhaler so that the top of the canister faces up and the spacer mouthpiece faces you.  Put your index finger on the top of the medicine canister. Your thumb supports the bottom of the inhaler and the spacer.  Breathe out (exhale) normally and as completely as possible.  Immediately after exhaling, place the spacer between your teeth and into your mouth. Close your lips tightly around the spacer.  Press the canister down with your index finger to release the medicine.  At the same time as the canister is pressed, inhale deeply and slowly until your lungs are completely filled. This should take 4-6 seconds. Keep your tongue down and out of the way.  Hold the medicine in your lungs for 5-10 seconds (10 seconds is best). This helps the medicine get into the small airways of your lungs. Exhale.  Repeat inhaling deeply through the spacer mouthpiece. Again hold that breath for up to 10 seconds (10 seconds is best). Exhale slowly. If it is difficult to take this second deep breath through the spacer, breathe normally several times through the spacer. Remove the spacer from your mouth.  Wait at least 15-30 seconds between puffs. Continue with the above steps until you  have taken the number of puffs your health care provider has ordered. Do not use the inhaler more than your health care provider tells you.  Remove the spacer from the inhaler, and place the cap on the inhaler.  Follow the directions from your health care provider or the inhaler insert for cleaning the inhaler and spacer. If you are using different kinds of inhalers, use your quick relief medicine to open the airways 10-15 minutes before using a steroid if instructed to do so by your health care provider. If you are unsure which inhalers to use and the order of using them, ask your health care provider, nurse, or respiratory therapist. If you are using a steroid inhaler, always rinse your mouth with water after your last puff, then gargle and spit out the water. Do not swallow the water. AVOID:  Inhaling before or after starting the spray of medicine. It takes practice to coordinate your breathing with triggering the spray.  Inhaling through the nose (rather than the mouth) when triggering the spray.  HOW TO DETERMINE IF YOUR INHALER IS FULL OR NEARLY EMPTY You cannot know when an inhaler is empty by shaking it. A few inhalers are now being made with dose counters. Ask your health care provider for a prescription that has a dose counter if you feel you need that extra help. If your inhaler does not have a counter, ask your health care provider to help you determine the date you need to refill your inhaler. Write the refill date on a calendar or your inhaler canister. Refill your inhaler 7-10 days before it runs out. Be sure to keep an adequate supply of medicine. This includes making sure it is not expired, and that you have a spare inhaler.  SEEK MEDICAL CARE IF:   Your symptoms are only partially relieved with your inhaler.  You are having trouble using your inhaler.  You have some increase in phlegm. SEEK IMMEDIATE MEDICAL CARE IF:   You feel little or no relief with your inhalers. You are  still wheezing and are feeling shortness of breath or tightness in your chest or both.  You have dizziness, headaches, or a fast heart rate.  You have chills, fever, or night sweats.  You have a noticeable increase in phlegm production, or there is blood in the phlegm. MAKE SURE YOU:   Understand these instructions.  Will watch your condition.  Will get help right away if you are not doing well or get worse. Document Released: 05/18/2000 Document Revised: 03/11/2013 Document Reviewed: 12/18/2012 [facility] Patient Information 2015 [place], [place]. This information is not intended to replace advice given to you by your health care provider. Make sure you discuss any questions you have with your health care provider.  Shortness of Breath Shortness of breath means you have trouble breathing. Shortness of breath needs medical care right away. HOME CARE   Do not smoke.  Avoid being around chemicals or things (paint fumes, dust) that may bother your breathing.  Rest as needed. Slowly begin your normal activities.  Only take medicines as told by your doctor.  Keep all doctor visits as told. GET HELP RIGHT AWAY IF:   Your shortness of breath gets worse.  You feel lightheaded, pass out (faint), or have a cough that is not helped by medicine.  You cough up blood.  You have pain with breathing.  You have pain in your chest, arms, shoulders, or belly (abdomen).  You have a fever.  You cannot walk up stairs or exercise the way you normally do.  You do not get better in the time expected.  You have a hard time doing normal activities even with rest.  You have problems with your medicines.  You have any new symptoms. MAKE SURE YOU:  Understand these instructions.  Will watch your condition.  Will get help right away if you are not doing well or get worse. Document Released: 11/07/2007 Document Revised: 05/26/2013 Document Reviewed: 08/06/2011 [facility] Patient Information  2015 [place], [place]. This information is not intended to replace advice given to you by your health care provider. Make sure you discuss any questions you have with your health care provider.  Upper Respiratory Infection, Adult An upper respiratory infection (URI) is also sometimes known as the common cold. The upper respiratory tract includes the nose, sinuses, throat, trachea, and bronchi. Bronchi are the airways leading to the lungs. Most people improve within 1 week, but symptoms can last up to 2 weeks. A residual cough may last even longer.  CAUSES Many  different viruses can infect the tissues lining the upper respiratory tract. The tissues become irritated and inflamed and often become very moist. Mucus production is also common. A cold is contagious. You can easily spread the virus to others by oral contact. This includes kissing, sharing a glass, coughing, or sneezing. Touching your mouth or nose and then touching a surface, which is then touched by another person, can also spread the virus. SYMPTOMS  Symptoms typically develop 1 to 3 days after you come in contact with a cold virus. Symptoms vary from person to person. They may include:  Runny nose.  Sneezing.  Nasal congestion.  Sinus irritation.  Sore throat.  Loss of voice (laryngitis).  Cough.  Fatigue.  Muscle aches.  Loss of appetite.  Headache.  Low-grade fever. DIAGNOSIS  You might diagnose your own cold based on familiar symptoms, since most people get a cold 2 to 3 times a year. Your caregiver can confirm this based on your exam. Most importantly, your caregiver can check that your symptoms are not due to another disease such as strep throat, sinusitis, pneumonia, asthma, or epiglottitis. Blood tests, throat tests, and X-rays are not necessary to diagnose a common cold, but they may sometimes be helpful in excluding other more serious diseases. Your caregiver will decide if any further tests are required. RISKS  AND COMPLICATIONS  You may be at risk for a more severe case of the common cold if you smoke cigarettes, have chronic heart disease (such as heart failure) or lung disease (such as asthma), or if you have a weakened immune system. The very young and very old are also at risk for more serious infections. Bacterial sinusitis, middle ear infections, and bacterial pneumonia can complicate the common cold. The common cold can worsen asthma and chronic obstructive pulmonary disease (COPD). Sometimes, these complications can require emergency medical care and may be life-threatening. PREVENTION  The best way to protect against getting a cold is to practice good hygiene. Avoid oral or hand contact with people with cold symptoms. Wash your hands often if contact occurs. There is no clear evidence that vitamin C, vitamin E, echinacea, or exercise reduces the chance of developing a cold. However, it is always recommended to get plenty of rest and practice good nutrition. TREATMENT  Treatment is directed at relieving symptoms. There is no cure. Antibiotics are not effective, because the infection is caused by a virus, not by bacteria. Treatment may include:  Increased fluid intake. Sports drinks offer valuable electrolytes, sugars, and fluids.  Breathing heated mist or steam (vaporizer or shower).  Eating chicken soup or other clear broths, and maintaining good nutrition.  Getting plenty of rest.  Using gargles or lozenges for comfort.  Controlling fevers with ibuprofen or acetaminophen as directed by your caregiver.  Increasing usage of your inhaler if you have asthma. Zinc gel and zinc lozenges, taken in the first 24 hours of the common cold, can shorten the duration and lessen the severity of symptoms. Pain medicines may help with fever, muscle aches, and throat pain. A variety of non-prescription medicines are available to treat congestion and runny nose. Your caregiver can make recommendations and may  suggest nasal or lung inhalers for other symptoms.  HOME CARE INSTRUCTIONS   Only take over-the-counter or prescription medicines for pain, discomfort, or fever as directed by your caregiver.  Use a warm mist humidifier or inhale steam from a shower to increase air moisture. This may keep secretions moist and make it easier  to breathe.  Drink enough water and fluids to keep your urine clear or pale yellow.  Rest as needed.  Return to work when your temperature has returned to normal or as your caregiver advises. You may need to stay home longer to avoid infecting others. You can also use a face mask and careful hand washing to prevent spread of the virus. SEEK MEDICAL CARE IF:   After the first few days, you feel you are getting worse rather than better.  You need your caregiver's advice about medicines to control symptoms.  You develop chills, worsening shortness of breath, or brown or red sputum. These may be signs of pneumonia.  You develop yellow or brown nasal discharge or pain in the face, especially when you bend forward. These may be signs of sinusitis.  You develop a fever, swollen neck glands, pain with swallowing, or white areas in the back of your throat. These may be signs of strep throat. SEEK IMMEDIATE MEDICAL CARE IF:   You have a fever.  You develop severe or persistent headache, ear pain, sinus pain, or chest pain.  You develop wheezing, a prolonged cough, cough up blood, or have a change in your usual mucus (if you have chronic lung disease).  You develop sore muscles or a stiff neck. Document Released: 11/14/2000 Document Revised: 08/13/2011 Document Reviewed: 08/26/2013 [facility] Patient Information 2015 [place], [place]. This information is not intended to replace advice given to you by your health care provider. Make sure you discuss any questions you have with your health care provider.

## 2014-06-14 NOTE — ED Notes (Signed)
Pt sts body aches and chills with fever x 4 days; pt seen here Friday for same

## 2014-06-14 NOTE — ED Provider Notes (Signed)
CSN: [id number]     Arrival date & time 06/14/14  1303 History  This chart was scribed for non-physician practitioner, Zacarias Pontes, PA-C working with Dot Lanes, MD by Evelene Croon, ED Scribe. This patient was seen in room TR10C/TR10C and the patient's care was started at 1:57 PM.    Chief Complaint  Patient presents with  . Influenza     Patient is a 44 y.o. male presenting with flu symptoms. The history is provided by the patient. No language interpreter was used.  Influenza Presenting symptoms: cough (productive of unknown color), fever, myalgias, rhinorrhea, shortness of breath and sore throat   Presenting symptoms: no diarrhea, no headaches, no nausea and no vomiting   Cough:    Cough characteristics:  Productive   Sputum characteristics:  Unable to specify   Severity:  Moderate   Onset quality:  Gradual   Duration:  4 days   Timing:  Constant   Progression:  Worsening   Chronicity:  New Severity:  Moderate Duration:  4 days Progression:  Worsening Chronicity:  New Relieved by:  Nothing Worsened by:  Nothing tried Ineffective treatments:  Prescription medications Associated symptoms: nasal congestion   Associated symptoms: no chills, no decreased appetite, no decrease in physical activity, no ear pain, no mental status change, no neck stiffness and no witnessed syncope      HPI Comments:  Andre Decker is a 44 y.o. male with a PMHx of HTN, who presents to the Emergency Department complaining of generalized body aches and productive cough for 4 days.He also reports associated subjective oral fever with max temp of 102, mild sore throat, rhinorrhea, mild DOE, and occasional wheezing. Pt was seen in the ED 4 days ago for same symptoms, he was discharged home with Rx for tamiflu and tussinex which he has taken without improvement. He has also taken Advil without relief. Pt states symptoms have gradually worsened since onset. Pt states he saw PCP this am and  was sent to the ED by PCP for more testing. He denies abd pain, nausea, vomiting, diarrhea, constipation, ear pain, ear drainage, difficulty swallowing, trismus, CP, and lower extremity edema.   Past Medical History  Diagnosis Date  . Hypertension   . Glaucoma   . Colon polyps   . Crohn disease 1995  . Sleep apnea    Past Surgical History  Procedure Laterality Date  . Eye surgery Bilateral     part. detached retina  . Cataract extraction Left     lens inplant   Family History  Problem Relation Age of Onset  . Migraines Mother   . Colon cancer Neg Hx   . Prostate cancer Maternal Grandfather   . Prostate cancer Maternal Uncle    History  Substance Use Topics  . Smoking status: Never Smoker   . Smokeless tobacco: Never Used  . Alcohol Use: No    Review of Systems  Constitutional: Positive for fever. Negative for chills and decreased appetite.  HENT: Positive for congestion, rhinorrhea and sore throat. Negative for drooling, ear discharge, ear pain and trouble swallowing.   Eyes: Negative for pain and discharge.  Respiratory: Positive for cough (productive of unknown color), shortness of breath and wheezing.   Cardiovascular: Negative for chest pain, palpitations and leg swelling.  Gastrointestinal: Negative for nausea, vomiting, abdominal pain, diarrhea and constipation.  Musculoskeletal: Positive for myalgias. Negative for back pain, arthralgias and neck stiffness.  Skin: Negative for color change and rash.  Allergic/Immunologic: Negative for immunocompromised  state.  Neurological: Negative for weakness, light-headedness, numbness and headaches.  Hematological: Negative for adenopathy.  Psychiatric/Behavioral: Negative for confusion.    10 Systems reviewed and all are negative for acute change except as noted in the HPI.    Allergies  Review of patient's allergies indicates no known allergies.  Home Medications   Prior to Admission medications   Medication Sig  Start Date End Date Taking? Authorizing Provider  brimonidine-timolol (COMBIGAN) 0.2-0.5 % ophthalmic solution Place 1 drop into both eyes at bedtime.     Historical Provider, MD  chlorpheniramine-HYDROcodone (TUSSIONEX PENNKINETIC ER) 10-8 MG/5ML LQCR Take 5 mLs by mouth every 12 (twelve) hours as needed for cough (Cough). 06/11/14   Larene Pickett, PA-C  HYDROcodone-acetaminophen (NORCO/VICODIN) 5-325 MG per tablet Take 1 tab every 6 hours as needed for pain 07/06/13   Amy S Esterwood, PA-C  ibuprofen (ADVIL,MOTRIN) 800 MG tablet Take 1 tablet (800 mg total) by mouth 3 (three) times daily. 07/18/13   Domenic Moras, PA-C  lisinopril-hydrochlorothiazide (PRINZIDE,ZESTORETIC) 20-12.5 MG per tablet Take 1 tablet by mouth daily.     Historical Provider, MD  methocarbamol (ROBAXIN) 500 MG tablet Take 1 tablet (500 mg total) by mouth 2 (two) times daily. 07/18/13   Domenic Moras, PA-C  omeprazole-sodium bicarbonate (ZEGERID) 40-1100 MG per capsule Take 1 capsule by mouth daily before breakfast. 07/06/13   Amy S Esterwood, PA-C  ondansetron (ZOFRAN) 4 MG tablet Take 1 tab every 6 hours as needed for nausea. 07/06/13   Amy S Esterwood, PA-C  oseltamivir (TAMIFLU) 75 MG capsule Take 1 capsule (75 mg total) by mouth every 12 (twelve) hours. 06/11/14   Larene Pickett, PA-C  travoprost, benzalkonium, (TRAVATAN) 0.004 % ophthalmic solution Place 1 drop into both eyes at bedtime.     Historical Provider, MD   BP 132/85 mmHg  Pulse 58  Temp(Src) 99.8 F (37.7 C) (Oral)  SpO2 98% Physical Exam  Constitutional: He is oriented to person, place, and time. Vital signs are normal. He appears well-developed and well-nourished.  Non-toxic appearance. No distress.  Afebrile, nontoxic, appears tired but in NAD  HENT:  Head: Normocephalic and atraumatic.  Right Ear: Hearing, tympanic membrane, external ear and ear canal normal.  Left Ear: Hearing, tympanic membrane, external ear and ear canal normal.  Nose: Mucosal edema and  rhinorrhea present.  Mouth/Throat: Uvula is midline and mucous membranes are normal. No trismus in the jaw. No uvula swelling. Posterior oropharyngeal erythema present. No oropharyngeal exudate.  Mild pharyngeal erythema without exudates, no tonsillar swelling or PTA. No trismus or drooling. MMM. Nose with mild rhinorrhea and b/l turbinate edema. Ears clear bilaterally  Eyes: Conjunctivae and EOM are normal. Right eye exhibits no discharge. Left eye exhibits no discharge.  Neck: Normal range of motion. Neck supple.  Cardiovascular: Normal rate, regular rhythm, normal heart sounds and intact distal pulses.  Exam reveals no gallop and no friction rub.   No murmur heard. RRR, nl s1/s2, no m/r/g  Pulmonary/Chest: Effort normal. No respiratory distress. He has no decreased breath sounds. He has wheezes (diffuse expiratory). He has rhonchi in the left middle field and the left lower field. He has no rales. He exhibits no tenderness.  Diffuse expiratory wheezing throughout Mild LLL rhonchi which clears somewhat with cough Intermittent productive cough No increased WOB or resp distress, SpO2 99% on RA  Abdominal: Soft. Normal appearance and bowel sounds are normal. He exhibits no distension. There is no tenderness. There is no rigidity, no rebound and  no guarding.  Musculoskeletal: Normal range of motion.  Lymphadenopathy:    He has cervical adenopathy.  Shotty anterior cervical LAD throughout  Neurological: He is alert and oriented to person, place, and time. He has normal strength. No sensory deficit. Gait normal.  Skin: Skin is warm, dry and intact. No rash noted.  Psychiatric: He has a normal mood and affect.  Nursing note and vitals reviewed.   ED Course  Procedures   DIAGNOSTIC STUDIES:  Oxygen Saturation is 98% on RA, normal by my interpretation.    COORDINATION OF CARE:  2:05 PM Discussed treatment plan with pt at bedside and pt agreed to plan. 3:36 PM Pt reassessed and updated with  results.  Labs Review Labs Reviewed  BASIC METABOLIC PANEL - Abnormal; Notable for the following:    GFR calc non Af Amer 72 (*)    GFR calc Af Amer 83 (*)    All other components within normal limits  CBC WITH DIFFERENTIAL - Abnormal; Notable for the following:    HCT 38.3 (*)    All other components within normal limits  BRAIN NATRIURETIC PEPTIDE  I-STAT TROPOININ, ED    Imaging Review Dg Chest 2 View  06/14/2014   CLINICAL DATA:  Shortness breath and cough.  Flu symptoms.  EXAM: CHEST  2 VIEW  COMPARISON:  06/11/2014.  FINDINGS: Trachea is midline. Heart size normal. Lungs are clear. No pleural fluid.  IMPRESSION: Negative.   Electronically Signed   By: Lorin Picket M.D.   On: 06/14/2014 14:47     EKG Interpretation None    EKG: unchanged from prior  MDM   Final diagnoses:  SOB (shortness of breath)  Wheezing  URI (upper respiratory infection)  Cough  Nasal congestion    44 y.o. male with body aches, SOB, wheezing, and productive cough that's worsening over 4 days. Seen in ED 3 days ago with neg RST, started on tamiflu (rapid flu neg per dr's office) and tussinex. Pt sent here by PCP. Called them to verify what their concern was, they just stated he "may need further testing" since he was having ongoing SOB and cough. Initially performed MSE but no pod areas were available therefore kept here for eval. EKG unremarkable, trop neg, BNP slightly worse than prior ED visit but unconcerning. BMP WNL. CBC w/diff unremarkable. CXR neg for PNA. Lung sounds with some wheezing initially which cleared after duoneb. Pt ambulatory without desats. Discussed that this likely was viral, but pt insistent that he needed abx given worsening symptoms. Agreed to start levaquin for URI but discussed that his course may not change if this was viral. Will have him f/up with PCP in 3-5 days for recheck. I explained the diagnosis and have given explicit precautions to return to the ER including for any  other new or worsening symptoms. The patient understands and accepts the medical plan as it's been dictated and I have answered their questions. Discharge instructions concerning home care and prescriptions have been given. The patient is STABLE and is discharged to home in good condition.   I personally performed the services described in this documentation, which was scribed in my presence. The recorded information has been reviewed and is accurate.  BP 121/74 mmHg  Pulse 74  Temp(Src) 98.7 F (37.1 C) (Oral)  Resp 22  SpO2 99%  Meds ordered this encounter  Medications  . albuterol (PROVENTIL) (2.5 MG/3ML) 0.083% nebulizer solution 5 mg    Sig:   . ipratropium (ATROVENT) nebulizer  solution 0.5 mg    Sig:   . levofloxacin (LEVAQUIN) 750 MG tablet    Sig: Take 1 tablet (750 mg total) by mouth daily. X 7 days    Dispense:  7 tablet    Refill:  0    Order Specific Question:  Supervising Provider    Answer:  Noemi Chapel D [[id number]]  . albuterol (PROVENTIL HFA;VENTOLIN HFA) 108 (90 BASE) MCG/ACT inhaler    Sig: Inhale 2 puffs into the lungs every 2 (two) hours as needed for wheezing or shortness of breath (cough).    Dispense:  1 Inhaler    Refill:  0    Order Specific Question:  Supervising Provider    Answer:  Johnna Acosta [address] [place], PA-C 06/14/14 [place], MD 06/15/14 [phone]

## 2014-06-14 NOTE — ED Notes (Signed)
Pt sent from PCP ( Dr Laurance Flatten ) with [facility]. . Pt does not know why he was sent. Pt was seen in ED on Friday and still feels bad.

## 2014-06-14 NOTE — ED Notes (Signed)
Pt ambulated to nurses desk with O2 sats100%

## 2014-06-14 NOTE — ED Provider Notes (Signed)
MSE was initiated and I personally evaluated the patient and placed orders (if any) at  2:07 PM on June 14, 2014.  Andre Decker is a 44 y.o. male who presents to the Emergency Department complaining of generalized body aches and productive cough for 5 days.He also reports associated fever with max temp of 102, mild sore throat, rhinorrhea, mild DOE and occasional wheezing. Pt was seen in the ED 4 days ago for same symptoms, he was discharged home with Rx for tamiflu and tussinex which he has taken without improvement. He has also taken Advil without relief.Pt states symptoms have gradually worsened since onset. Pt states he saw PCP this am and was sent to the ED by PCP for "more testing". He denies nausea,vomiting, diarrhea, constipation, ear pain, ear drainage, difficulty swallowing, and lower extremity edema.  Exam reveals mild oropharyngeal erythema without exudates. Lungs with L lower lobe rhonchi and diffuse expiratory wheezing. Pt with 99.8 temp, SpO2 98% on RA. Given that pt has failed outpt management and sent here by PCP for more testing, will start work up and move to [employer].  BP 132/85 mmHg  Pulse 58  Temp(Src) 99.8 F (37.7 C) (Oral)  SpO2 98%   The patient appears stable so that the remainder of the MSE may be completed by another provider.  Patty Sermons [place], PA-C 06/14/14 1409  Dot Lanes, MD 06/14/14 1432

## 2014-06-17 ENCOUNTER — Emergency Department (HOSPITAL_COMMUNITY)
Admission: EM | Admit: 2014-06-17 | Discharge: 2014-06-17 | Disposition: A | Discharge status: Home / Self Care | Payer: 59 | Attending: Emergency Medicine | Admitting: Emergency Medicine

## 2014-06-17 ENCOUNTER — Encounter (HOSPITAL_COMMUNITY): Payer: Self-pay | Admitting: Emergency Medicine

## 2014-06-17 ENCOUNTER — Emergency Department (HOSPITAL_COMMUNITY): Payer: 59

## 2014-06-17 DIAGNOSIS — B349 Viral infection, unspecified: Secondary | ICD-10-CM | POA: Diagnosis not present

## 2014-06-17 DIAGNOSIS — Z8719 Personal history of other diseases of the digestive system: Secondary | ICD-10-CM | POA: Insufficient documentation

## 2014-06-17 DIAGNOSIS — Z79899 Other long term (current) drug therapy: Secondary | ICD-10-CM | POA: Diagnosis not present

## 2014-06-17 DIAGNOSIS — Z792 Long term (current) use of antibiotics: Secondary | ICD-10-CM | POA: Insufficient documentation

## 2014-06-17 DIAGNOSIS — Z8669 Personal history of other diseases of the nervous system and sense organs: Secondary | ICD-10-CM | POA: Diagnosis not present

## 2014-06-17 DIAGNOSIS — Z86010 Personal history of colonic polyps: Secondary | ICD-10-CM | POA: Diagnosis not present

## 2014-06-17 DIAGNOSIS — I10 Essential (primary) hypertension: Secondary | ICD-10-CM | POA: Diagnosis not present

## 2014-06-17 DIAGNOSIS — H409 Unspecified glaucoma: Secondary | ICD-10-CM | POA: Diagnosis not present

## 2014-06-17 DIAGNOSIS — Z791 Long term (current) use of non-steroidal anti-inflammatories (NSAID): Secondary | ICD-10-CM | POA: Diagnosis not present

## 2014-06-17 DIAGNOSIS — R0600 Dyspnea, unspecified: Secondary | ICD-10-CM

## 2014-06-17 LAB — BASIC METABOLIC PANEL
ANION GAP: 11 (ref 5–15)
BUN: 14 mg/dL (ref 6–23)
CO2: 23 mmol/L (ref 19–32)
CREATININE: 1.43 mg/dL — AB (ref 0.50–1.35)
Calcium: 9.8 mg/dL (ref 8.4–10.5)
Chloride: 107 mEq/L (ref 96–112)
GFR calc non Af Amer: 59 mL/min — ABNORMAL LOW (ref >90)
GFR, EST AFRICAN AMERICAN: 69 mL/min — AB (ref >90)
GLUCOSE: 98 mg/dL (ref 70–99)
Potassium: 3.9 mmol/L (ref 3.5–5.1)
Sodium: 141 mmol/L (ref 135–145)

## 2014-06-17 LAB — CBC WITH DIFFERENTIAL/PLATELET
Basophils Absolute: 0 10*3/uL (ref 0.0–0.1)
Basophils Relative: 1 % (ref 0–1)
EOS ABS: 0 10*3/uL (ref 0.0–0.7)
EOS PCT: 1 % (ref 0–5)
HCT: 38.4 % — ABNORMAL LOW (ref 39.0–52.0)
Hemoglobin: 13.3 g/dL (ref 13.0–17.0)
LYMPHS ABS: 1.8 10*3/uL (ref 0.7–4.0)
Lymphocytes Relative: 28 % (ref 12–46)
MCH: 27.8 pg (ref 26.0–34.0)
MCHC: 34.6 g/dL (ref 30.0–36.0)
MCV: 80.3 fL (ref 78.0–100.0)
MONO ABS: 0.6 10*3/uL (ref 0.1–1.0)
Monocytes Relative: 9 % (ref 3–12)
NEUTROS ABS: 4.1 10*3/uL (ref 1.7–7.7)
NEUTROS PCT: 63 % (ref 43–77)
Platelets: 224 10*3/uL (ref 150–400)
RBC: 4.78 MIL/uL (ref 4.22–5.81)
RDW: 12.8 % (ref 11.5–15.5)
WBC: 6.5 10*3/uL (ref 4.0–10.5)

## 2014-06-17 LAB — URINALYSIS, ROUTINE W REFLEX MICROSCOPIC
Bilirubin Urine: NEGATIVE
Glucose, UA: NEGATIVE mg/dL
HGB URINE DIPSTICK: NEGATIVE
Ketones, ur: NEGATIVE mg/dL
Leukocytes, UA: NEGATIVE
Nitrite: NEGATIVE
PH: 6 (ref 5.0–8.0)
Protein, ur: NEGATIVE mg/dL
Specific Gravity, Urine: 1.013 (ref 1.005–1.030)
Urobilinogen, UA: 0.2 mg/dL (ref 0.0–1.0)

## 2014-06-17 LAB — HEPATIC FUNCTION PANEL
ALBUMIN: 3.9 g/dL (ref 3.5–5.2)
ALT: 69 U/L — AB (ref 0–53)
AST: 54 U/L — ABNORMAL HIGH (ref 0–37)
Alkaline Phosphatase: 69 U/L (ref 39–117)
BILIRUBIN DIRECT: 0.1 mg/dL (ref 0.0–0.3)
Indirect Bilirubin: 0.6 mg/dL (ref 0.3–0.9)
Total Bilirubin: 0.7 mg/dL (ref 0.3–1.2)
Total Protein: 8 g/dL (ref 6.0–8.3)

## 2014-06-17 LAB — I-STAT TROPONIN, ED: Troponin i, poc: 0 ng/mL (ref 0.00–0.08)

## 2014-06-17 LAB — BRAIN NATRIURETIC PEPTIDE: B NATRIURETIC PEPTIDE 5: 22.4 pg/mL (ref 0.0–100.0)

## 2014-06-17 LAB — RAPID HIV SCREEN (WH-MAU): Rapid HIV Screen: NONREACTIVE

## 2014-06-17 LAB — MONONUCLEOSIS SCREEN: Mono Screen: NEGATIVE

## 2014-06-17 MED ORDER — SODIUM CHLORIDE 0.9 % IV BOLUS (SEPSIS)
1000.0000 mL | Freq: Once | INTRAVENOUS | Status: AC
  Administered 2014-06-17: 1000 mL via INTRAVENOUS

## 2014-06-17 MED ORDER — IPRATROPIUM-ALBUTEROL 0.5-2.5 (3) MG/3ML IN SOLN
3.0000 mL | Freq: Once | RESPIRATORY_TRACT | Status: AC
Start: 2014-06-17 — End: 2014-06-17
  Administered 2014-06-17: 3 mL via RESPIRATORY_TRACT
  Filled 2014-06-17: qty 3

## 2014-06-17 NOTE — ED Provider Notes (Signed)
CSN: [id number]     Arrival date & time 06/17/14  3329 History   First MD Initiated Contact with Patient 06/17/14 [phone]     Chief Complaint  Patient presents with  . Fever  . Cough     (Consider location/radiation/quality/duration/timing/severity/associated sxs/prior Treatment) HPI Comments: The patient is a 44 year old male with a past medical history of hypertension. Emergency room chief complaint of dyspnea worsened today. Patient reports dyspnea at rest. He reports associated tingling in upper extremities and face. Patient reports initial onset of generalized myalgias, cough 1 week ago. He reports he was evaluated in the emergency department twice since that time with negative testing. He has been treated for a influenza-like illness, with tamiflu.  He reports mild resolution of symptoms 3 days ago.  Pts wife reports worsening symptoms at night and the morning.  Reports fever today 104 and 102 taken by EMS, given 1g tylenol.  Reports facial rash is chronic.  Denies history of immunocompromise, HIV, DM.   Patient is a 44 y.o. male presenting with fever and cough. The history is provided by the patient and the spouse. No language interpreter was used.  Fever Associated symptoms: cough and myalgias   Associated symptoms: no chest pain and no vomiting   Cough Associated symptoms: fever, myalgias and shortness of breath   Associated symptoms: no chest pain     Past Medical History  Diagnosis Date  . Hypertension   . Glaucoma   . Colon polyps   . Crohn disease 1995  . Sleep apnea    Past Surgical History  Procedure Laterality Date  . Eye surgery Bilateral     part. detached retina  . Cataract extraction Left     lens inplant   Family History  Problem Relation Age of Onset  . Migraines Mother   . Colon cancer Neg Hx   . Prostate cancer Maternal Grandfather   . Prostate cancer Maternal Uncle    History  Substance Use Topics  . Smoking status: Never Smoker   . Smokeless  tobacco: Never Used  . Alcohol Use: No    Review of Systems  Constitutional: Positive for fever and fatigue.  Respiratory: Positive for cough and shortness of breath. Negative for chest tightness.   Cardiovascular: Negative for chest pain, palpitations and leg swelling.  Gastrointestinal: Negative for vomiting.  Musculoskeletal: Positive for myalgias.      Allergies  Review of patient's allergies indicates no known allergies.  Home Medications   Prior to Admission medications   Medication Sig Start Date End Date Taking? Authorizing Provider  albuterol (PROVENTIL HFA;VENTOLIN HFA) 108 (90 BASE) MCG/ACT inhaler Inhale 2 puffs into the lungs every 2 (two) hours as needed for wheezing or shortness of breath (cough). 06/14/14   Mercedes Strupp Camprubi-[place], PA-C  brimonidine-timolol (COMBIGAN) 0.2-0.5 % ophthalmic solution Place 1 drop into both eyes at bedtime.     Historical Provider, MD  chlorpheniramine-HYDROcodone (TUSSIONEX PENNKINETIC ER) 10-8 MG/5ML LQCR Take 5 mLs by mouth every 12 (twelve) hours as needed for cough (Cough). 06/11/14   Larene Pickett, PA-C  HYDROcodone-acetaminophen (NORCO/VICODIN) 5-325 MG per tablet Take 1 tab every 6 hours as needed for pain 07/06/13   Amy S Esterwood, PA-C  ibuprofen (ADVIL,MOTRIN) 800 MG tablet Take 1 tablet (800 mg total) by mouth 3 (three) times daily. 07/18/13   Domenic Moras, PA-C  levofloxacin (LEVAQUIN) 750 MG tablet Take 1 tablet (750 mg total) by mouth daily. X 7 days 06/14/14   Patty Sermons Camprubi-[place],  PA-C  lisinopril-hydrochlorothiazide (PRINZIDE,ZESTORETIC) 20-12.5 MG per tablet Take 1 tablet by mouth daily.     Historical Provider, MD  methocarbamol (ROBAXIN) 500 MG tablet Take 1 tablet (500 mg total) by mouth 2 (two) times daily. 07/18/13   Domenic Moras, PA-C  omeprazole-sodium bicarbonate (ZEGERID) 40-1100 MG per capsule Take 1 capsule by mouth daily before breakfast. 07/06/13   Amy S Esterwood, PA-C  ondansetron (ZOFRAN) 4 MG tablet  Take 1 tab every 6 hours as needed for nausea. 07/06/13   Amy S Esterwood, PA-C  oseltamivir (TAMIFLU) 75 MG capsule Take 1 capsule (75 mg total) by mouth every 12 (twelve) hours. 06/11/14   Larene Pickett, PA-C  travoprost, benzalkonium, (TRAVATAN) 0.004 % ophthalmic solution Place 1 drop into both eyes at bedtime.     Historical Provider, MD   BP 129/79 mmHg  Pulse 70  Temp(Src) 98.4 F (36.9 C) (Oral)  Resp 22  SpO2 98% Physical Exam  Constitutional: He is oriented to person, place, and time. He appears well-developed and well-nourished. No distress.  HENT:  Head: Normocephalic and atraumatic.  Mouth/Throat: Uvula is midline, oropharynx is clear and moist and mucous membranes are normal. No oropharyngeal exudate, posterior oropharyngeal edema or posterior oropharyngeal erythema.  Neck: Neck supple.  Cardiovascular: Normal rate and regular rhythm.   Pulmonary/Chest: Effort normal. Tachypnea noted. No respiratory distress. He has no wheezes. He has no rales.  Abdominal: Soft. There is no tenderness. There is no rebound and no guarding.  Musculoskeletal: Normal range of motion.  Neurological: He is oriented to person, place, and time.  Skin: Skin is warm and dry. He is not diaphoretic.  Psychiatric: He has a normal mood and affect. His behavior is normal.  Nursing note and vitals reviewed.   ED Course  Procedures (including critical care time) Labs Review Labs Reviewed  BASIC METABOLIC PANEL - Abnormal; Notable for the following:    Creatinine, Ser 1.43 (*)    GFR calc non Af Amer 59 (*)    GFR calc Af Amer 69 (*)    All other components within normal limits  HEPATIC FUNCTION PANEL - Abnormal; Notable for the following:    AST 54 (*)    ALT 69 (*)    All other components within normal limits  CBC WITH DIFFERENTIAL - Abnormal; Notable for the following:    HCT 38.4 (*)    All other components within normal limits  MONONUCLEOSIS SCREEN  BRAIN NATRIURETIC PEPTIDE  RAPID HIV  SCREEN ([facility])  URINALYSIS, ROUTINE W REFLEX MICROSCOPIC  CBC WITH DIFFERENTIAL  EPSTEIN-BARR VIRUS VCA ANTIBODY PANEL  CMV IGM  CMV ANTIBODY, IGG (EIA)  I-STAT TROPOININ, ED    Imaging Review Dg Chest 2 View  06/17/2014   CLINICAL DATA:  Cough, dyspnea, fever, dizziness, body aches for 1 week  EXAM: CHEST  2 VIEW  COMPARISON:  06/14/2014  FINDINGS: Borderline cardiomegaly. No acute infiltrate or pleural effusion. No pulmonary edema. Mild degenerative changes mid thoracic spine.  IMPRESSION: No active cardiopulmonary disease.   Electronically Signed   By: Lahoma Crocker M.D.   On: 06/17/2014 10:03     EKG Interpretation   Date/Time:  Thursday June 17 2014 08:51:36 EST Ventricular Rate:  56 PR Interval:  193 QRS Duration: 107 QT Interval:  439 QTC Calculation: 424 R Axis:   60 Text Interpretation:  Sinus rhythm Nonspecific T abnrm, anterolateral  leads Baseline wander in lead(s) V2 V6 Confirmed by Jeneen Rinks  MD, [place]  [phone]) on 06/17/2014 8:54:46  AM      MDM   Final diagnoses:  Dyspnea  Viral illness   Patient presents with shortness of breath and persistent myalgias and cough, fever recently evaluated in the emergency department twice over the last one week for similar complaints with negative workups. Likely viral illness patient treated with Tamiflu one week ago mono spot ordered, labs ordered, chest x-ray ordered. Patient complains of dyspnea, pulse ox 98% or greater on room air, patient is slightly tachypneic however patient is PERC negative. EKG without changes.  Chest x-ray without sign of infection, patient was recently treated with Levaquin. Cr 1.43 slightly elevated from previous, fluids given. Reevaluation patient reports minimal relief with albuterol inhaler. Re-eval pt sleeping in room SpO2 100% RA LFTs slightly elevated, questionable early mono as working DDX. 1:36 PM Reevaluation patient in room with family, discussed lab work, working diagnosis of Epstein-Barr  virus, need for further blood work that will be returning in two days. Discussed lab results, imaging results, and treatment plan with the patient. Return precautions given. Reports understanding and no other concerns at this time.  Patient is stable for discharge at this time.    Harvie Heck, PA-C 06/17/14 [place], MD 06/17/14 [phone]

## 2014-06-17 NOTE — ED Notes (Signed)
Called Lab about LFTs. Lab states they did not receive label. Lab will run tests now.

## 2014-06-17 NOTE — ED Notes (Signed)
Tried to get patient up and walk but he got dizzy and light headed while standing. His 02 states were 96 while standing.

## 2014-06-17 NOTE — Discharge Instructions (Signed)
Your symptoms are likely due to a viral illness, the results of your Epstein-Barr virus blood work will be available in 2 days. Call for a follow up appointment with a Family or Primary Care Provider.  Return if Symptoms worsen.   Take medication as prescribed.  Avoid injury to your abdomen, specifically left upper quadrant. If you get into accident or have any trauma to her abdomen in the next 6 weeks return to the emergency department. Drink plenty of fluids.  Ibuprofen for myalgias.

## 2014-06-17 NOTE — ED Notes (Signed)
Pt was given 1 g of tylenol en route.

## 2014-06-17 NOTE — ED Notes (Signed)
Per EMS: Pt c/o flu like sx x 1 wk.  Pt wife stated that he went to [facility] and his PCP.  Has been taking abx and tamiflu and has not had any relief.  Pt was told by PCP that there was "something on his CXR that doc didn't like".  Pt states he went back to [facility] and "they didn't do anything."

## 2014-06-17 NOTE — ED Notes (Addendum)
Pulse ox 94% while ambulating, patient started to feel dizzy after walking a few minutes.

## 2014-06-18 LAB — EPSTEIN-BARR VIRUS VCA ANTIBODY PANEL
EBV EA IgG: <5 U/mL (ref <9.0)
EBV NA IGG: 102 U/mL — AB (ref <18.0)
EBV VCA IGG: 186 U/mL — AB (ref <18.0)

## 2014-06-18 LAB — CMV ANTIBODY, IGG (EIA): CMV Ab - IgG: 2 U/mL — ABNORMAL HIGH (ref <0.60)

## 2014-06-18 LAB — CMV IGM

## 2014-08-04 ENCOUNTER — Emergency Department (HOSPITAL_BASED_OUTPATIENT_CLINIC_OR_DEPARTMENT_OTHER): Payer: Worker's Compensation

## 2014-08-04 ENCOUNTER — Encounter (HOSPITAL_BASED_OUTPATIENT_CLINIC_OR_DEPARTMENT_OTHER): Payer: Self-pay | Admitting: Emergency Medicine

## 2014-08-04 ENCOUNTER — Emergency Department (HOSPITAL_BASED_OUTPATIENT_CLINIC_OR_DEPARTMENT_OTHER)
Admission: EM | Admit: 2014-08-04 | Discharge: 2014-08-04 | Disposition: A | Discharge status: Home / Self Care | Payer: Worker's Compensation | Attending: Emergency Medicine | Admitting: Emergency Medicine

## 2014-08-04 DIAGNOSIS — Z86010 Personal history of colonic polyps: Secondary | ICD-10-CM | POA: Insufficient documentation

## 2014-08-04 DIAGNOSIS — Y92410 Unspecified street and highway as the place of occurrence of the external cause: Secondary | ICD-10-CM | POA: Diagnosis not present

## 2014-08-04 DIAGNOSIS — Z792 Long term (current) use of antibiotics: Secondary | ICD-10-CM | POA: Diagnosis not present

## 2014-08-04 DIAGNOSIS — S29012A Strain of muscle and tendon of back wall of thorax, initial encounter: Secondary | ICD-10-CM | POA: Insufficient documentation

## 2014-08-04 DIAGNOSIS — S8992XA Unspecified injury of left lower leg, initial encounter: Secondary | ICD-10-CM | POA: Insufficient documentation

## 2014-08-04 DIAGNOSIS — S161XXA Strain of muscle, fascia and tendon at neck level, initial encounter: Secondary | ICD-10-CM | POA: Diagnosis not present

## 2014-08-04 DIAGNOSIS — Z79899 Other long term (current) drug therapy: Secondary | ICD-10-CM | POA: Insufficient documentation

## 2014-08-04 DIAGNOSIS — I10 Essential (primary) hypertension: Secondary | ICD-10-CM | POA: Diagnosis not present

## 2014-08-04 DIAGNOSIS — H409 Unspecified glaucoma: Secondary | ICD-10-CM | POA: Insufficient documentation

## 2014-08-04 DIAGNOSIS — Y9389 Activity, other specified: Secondary | ICD-10-CM | POA: Insufficient documentation

## 2014-08-04 DIAGNOSIS — Y998 Other external cause status: Secondary | ICD-10-CM | POA: Diagnosis not present

## 2014-08-04 DIAGNOSIS — Z8719 Personal history of other diseases of the digestive system: Secondary | ICD-10-CM | POA: Diagnosis not present

## 2014-08-04 DIAGNOSIS — S199XXA Unspecified injury of neck, initial encounter: Secondary | ICD-10-CM | POA: Diagnosis present

## 2014-08-04 DIAGNOSIS — M25562 Pain in left knee: Secondary | ICD-10-CM

## 2014-08-04 HISTORY — DX: Irritable bowel syndrome, unspecified: K58.9

## 2014-08-04 MED ORDER — OXYCODONE-ACETAMINOPHEN 5-325 MG PO TABS
2.0000 | ORAL_TABLET | Freq: Once | ORAL | Status: AC
  Administered 2014-08-04: 2 via ORAL
  Filled 2014-08-04: qty 2

## 2014-08-04 MED ORDER — DIAZEPAM 5 MG PO TABS
5.0000 mg | ORAL_TABLET | Freq: Once | ORAL | Status: AC
  Administered 2014-08-04: 5 mg via ORAL
  Filled 2014-08-04: qty 1

## 2014-08-04 MED ORDER — IBUPROFEN 800 MG PO TABS: 800.0000 mg | ORAL_TABLET | Freq: Three times a day (TID) | ORAL | Status: DC

## 2014-08-04 MED ORDER — DIAZEPAM 5 MG PO TABS: 5.0000 mg | ORAL_TABLET | Freq: Two times a day (BID) | ORAL | Status: DC | PRN

## 2014-08-04 MED ORDER — HYDROCODONE-ACETAMINOPHEN 5-325 MG PO TABS: 1.0000 | ORAL_TABLET | ORAL | Status: DC | PRN
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
## 2014-09-06 ENCOUNTER — Ambulatory Visit (HOSPITAL_COMMUNITY)
Admission: RE | Admit: 2014-09-06 | Discharge: 2014-09-06 | Disposition: A | Discharge status: Home / Self Care | Payer: 59 | Source: Ambulatory Visit | Attending: Family Medicine | Admitting: Family Medicine

## 2014-09-06 ENCOUNTER — Other Ambulatory Visit (HOSPITAL_COMMUNITY): Payer: Self-pay | Admitting: Family Medicine

## 2014-09-06 DIAGNOSIS — M25569 Pain in unspecified knee: Secondary | ICD-10-CM | POA: Insufficient documentation

## 2014-09-06 DIAGNOSIS — R791 Abnormal coagulation profile: Secondary | ICD-10-CM | POA: Insufficient documentation

## 2014-09-06 DIAGNOSIS — R7989 Other specified abnormal findings of blood chemistry: Secondary | ICD-10-CM

## 2014-09-06 NOTE — Progress Notes (Signed)
*  Preliminary Results* Bilateral lower extremity venous duplex completed. Bilateral lower extremities are negative for deep vein thrombosis. There is no evidence of Baker's cyst bilaterally.  Preliminary results discussed with Dr. Marisue Humble.  09/06/2014  Maudry Mayhew, RVT, RDCS, RDMS

## 2014-09-21 ENCOUNTER — Ambulatory Visit: Payer: 59 | Attending: Family Medicine

## 2014-09-21 DIAGNOSIS — H409 Unspecified glaucoma: Secondary | ICD-10-CM | POA: Insufficient documentation

## 2014-09-21 DIAGNOSIS — M25561 Pain in right knee: Secondary | ICD-10-CM | POA: Diagnosis present

## 2014-09-21 DIAGNOSIS — K5090 Crohn's disease, unspecified, without complications: Secondary | ICD-10-CM | POA: Insufficient documentation

## 2014-09-21 DIAGNOSIS — G4730 Sleep apnea, unspecified: Secondary | ICD-10-CM | POA: Diagnosis not present

## 2014-09-21 DIAGNOSIS — M2560 Stiffness of unspecified joint, not elsewhere classified: Secondary | ICD-10-CM | POA: Insufficient documentation

## 2014-09-21 DIAGNOSIS — R269 Unspecified abnormalities of gait and mobility: Secondary | ICD-10-CM | POA: Insufficient documentation

## 2014-09-21 DIAGNOSIS — M25562 Pain in left knee: Secondary | ICD-10-CM | POA: Insufficient documentation

## 2014-09-21 DIAGNOSIS — K589 Irritable bowel syndrome without diarrhea: Secondary | ICD-10-CM | POA: Diagnosis not present

## 2014-09-21 DIAGNOSIS — I10 Essential (primary) hypertension: Secondary | ICD-10-CM | POA: Diagnosis not present

## 2014-09-21 DIAGNOSIS — M25669 Stiffness of unspecified knee, not elsewhere classified: Secondary | ICD-10-CM

## 2014-09-21 NOTE — Therapy (Signed)
[facility] [address], [place], [place], [address] Phone: [phone]   Fax:  [phone]  Physical Therapy Evaluation  Patient Details  Name: Efrain Clauson MRN: [id number] Date of Birth: [birth year] Referring Provider:  Jonathon Jordan, MD  Encounter Date: 09/21/2014      PT End of Session - 09/21/14 1647    Visit Number 1   Date for PT Re-Evaluation 11/16/14   PT Start Time 7591   PT Stop Time 1651   PT Time Calculation (min) 35 min   Activity Tolerance Patient tolerated treatment well   Behavior During Therapy [facility] for tasks assessed/performed      Past Medical History  Diagnosis Date  . Hypertension   . Glaucoma   . Colon polyps   . Crohn disease 1995  . Sleep apnea   . IBS (irritable bowel syndrome)     Past Surgical History  Procedure Laterality Date  . Eye surgery Bilateral     part. detached retina  . Cataract extraction Left     lens inplant    There were no vitals filed for this visit.  Visit Diagnosis:  Knee pain, bilateral - Plan: PT plan of care cert/re-cert  Stiffness of knee joint, unspecified laterality - Plan: PT plan of care cert/re-cert  Abnormality of gait - Plan: PT plan of care cert/re-cert      Subjective Assessment - 09/21/14 1625    Subjective Pt presents to PT with complaints of bilateral knee pain that began 07/2014 without incident or injury.     Diagnostic tests ultrasound to check for blood clots- negative   Patient Stated Goals reduce knee pain with standing and walking   Currently in Pain? Yes   Pain Score 4   up to 6/10 max   Pain Location Knee   Pain Orientation Right;Left   Pain Descriptors / Indicators Aching;Sore   Pain Type Chronic pain   Pain Onset More than a month ago   Aggravating Factors  standing and walking, sitting a long time and then trying to move, sometimes just comes and goes.     Pain Relieving Factors Pt denies that he can do  anything to alleviate the pain   Multiple Pain Sites No            [facility] PT Assessment - 09/21/14 0001    Assessment   Medical Diagnosis joint pain (M25.50)- knee   Onset Date 07/23/14   Precautions   Precautions None   Restrictions   Weight Bearing Restrictions No   Balance Screen   Has the patient fallen in the past 6 months No   Has the patient had a decrease in activity level because of a fear of falling?  No   Is the patient reluctant to leave their home because of a fear of falling?  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   Leisure referee    Cognition   Overall Cognitive Status Within Functional Limits for tasks assessed   Observation/Other Assessments   Focus on Therapeutic Outcomes (FOTO)  55% limitation   Posture/Postural Control   Posture/Postural  Control No significant limitations   ROM / Strength   AROM / PROM / Strength AROM;PROM;Strength   AROM   Overall AROM  Within functional limits for tasks performed   Overall AROM Comments Bilateral knee AROM is full with stiffness noted at end range knee extension and ankle DF   AROM Assessment Site Knee;Ankle   PROM   Overall PROM  Deficits;Within functional limits for tasks performed   Overall PROM Comments Stiffness and muscle tension noted at end range knee extension and ankle DF   Strength   Overall Strength Within functional limits for tasks performed   Overall Strength Comments 5/5 bilateral knee flexion and extension, hip flexion 4+/5, DF 5/5   Palpation   Palpation Pt with significant tissue mobility restrictions in bilateral gastrocs.  Tenderness to palpation over distal patella and  peripatellar region   Ambulation/Gait   Ambulation/Gait Yes   Ambulation/Gait Assistance 7: Independent   Ambulation Distance (Feet) 100 Feet   Gait Pattern --  Antalgia with increased right to left trunk sway   Ambulation Surface Level   Stairs Yes   Stairs Assistance 7: Independent   Stair Management Technique One rail Right;Alternating pattern                   [facility] Adult PT Treatment/Exercise - 09/21/14 0001    Exercises   Exercises Knee/Hip;Ankle   Knee/Hip Exercises: Stretches   Active Hamstring Stretch 3 reps;20 seconds   Knee/Hip Exercises: Seated   Long Arc Quad Strengthening;Both;2 sets;10 reps   Ankle Exercises: Stretches   Gastroc Stretch 3 reps;20 seconds  wall and step                PT Education - 09/21/14 1644    Education provided Yes   Education Details HEP: gastroc stretch (wall and step), seated hamstring stretch, long arc quad   Person(s) Educated Patient   Methods Explanation;Handout;Demonstration   Comprehension Verbalized understanding;Returned demonstration          PT Short Term Goals - 09/21/14 1654    PT SHORT TERM GOAL #1   Title be independent in initial HEP   Time 4   Period Weeks   Status New   PT SHORT TERM GOAL #2   Title report a 30% reduction in frequency and intensity of knee pain with ADLs and work tasks           PT Long Term Goals - 09/21/14 1629    PT LONG TERM GOAL #1   Title be independent in advanced HEP   Time 8   Period Weeks   Status New   PT LONG TERM GOAL #2   Title reduce FOTO to < or = to 36% limitation   Time 8   Period Weeks   Status New   PT LONG TERM GOAL #3   Title report a 60% reduction in the frequency and intensity of knee pain with ADLs and work tasks   Time 8   Period Weeks   Status New   PT LONG TERM GOAL #4   Title demonstrate symmetry with ambulation on level surface   Time 8   Period Weeks   Status New               Plan - 09/21/14 1647    Clinical  Impression Statement Pt presents to PT with bilateral knee pain x 2 months.  Pt demonstrated limited gastroc and hamstring flexibility and tight tissues.  Pt will benefit  from flexibiity, strength, manual and modalities to reduce pain.   Pt will benefit from skilled therapeutic intervention in order to improve on the following deficits Abnormal gait;Difficulty walking;Pain;Impaired flexibility;Increased fascial restricitons   Rehab Potential Good   PT Frequency 2x / week   PT Duration 8 weeks   PT Treatment/Interventions ADLs/Self Care Home Management;Therapeutic activities;Patient/family education;Therapeutic exercise;Passive range of motion;Manual techniques;Gait training;Ultrasound;Neuromuscular re-education;Electrical Stimulation   PT Next Visit Plan Knee flexibility, strength, manual and modalities as needed.    Consulted and Agree with Plan of Care Patient         Problem List Patient Active Problem List   Diagnosis Date Noted  . IRRITABLE BOWEL SYNDROME 05/23/2010  . OBESITY, UNSPECIFIED 03/15/2009  . HEMANGIOMA, HEPATIC 09/06/2007  . HYPERTENSION 09/06/2007    Mychelle Kendra, PT 09/21/2014, 5:03 PM  [facility] [address], [place], [place], [address] Phone: [phone]   Fax:  [phone]

## 2014-09-21 NOTE — Patient Instructions (Signed)
Achilles / Gastroc, Standing   Stand, right/left foot behind, heel on floor and foot straight, leg straight, forward leg bent. Move hips forward. Hold _20 seconds. Repeat 3___ times per session. Do _3__ sessions per day.  Copyright  VHI. All rights reserved.  HIP: Hamstrings - Short Sitting   Rest leg on raised surface. Keep knee straight. Lift chest. Hold _20__ seconds. _3__ reps per set, __3_ sets per day  Copyright  VHI. All rights reserved.  ANKLE: Dorsiflexion, Step Bilateral   Stand on step, hang both heels off the back of step. Hold 20___ seconds. _3__ reps per set, _3__ sets per day. Copyright  VHI. All rights reserved.  KNEE: Extension, Long Arc Quad (Weight)   Place weight around leg. Raise leg until knee is straight. Hold _5__ seconds. Use ___ lb weight. _10__ reps per set, 3___ sets per day.  Copyright  VHI. All rights reserved.

## 2014-09-23 ENCOUNTER — Ambulatory Visit: Payer: 59 | Admitting: Physical Therapy

## 2014-09-23 ENCOUNTER — Encounter: Payer: Self-pay | Admitting: Physical Therapy

## 2014-09-23 DIAGNOSIS — M25669 Stiffness of unspecified knee, not elsewhere classified: Secondary | ICD-10-CM

## 2014-09-23 DIAGNOSIS — M25562 Pain in left knee: Principal | ICD-10-CM

## 2014-09-23 DIAGNOSIS — M25561 Pain in right knee: Secondary | ICD-10-CM

## 2014-09-23 DIAGNOSIS — R269 Unspecified abnormalities of gait and mobility: Secondary | ICD-10-CM

## 2014-09-23 NOTE — Patient Instructions (Signed)
Soleus Stretch   Stand with right foot back, both knees bent. Keeping heel on floor, turned slightly out, lean into wall until stretch is felt in lower calf. Keep your pelvic neutral/ no twisting. Hold 20 seconds. Repeat 3 times per set. Do __1__ sets per session. Do __3__ sessions per day.  http://orth.exer.us/24   Copyright  VHI. All rights reserved.

## 2014-09-23 NOTE — Therapy (Signed)
[facility] [address] [place], [place], [address] Phone: [phone]   Fax:  [phone]  Physical Therapy Treatment  Patient Details  Name: [place] MRN: [id number] Date of Birth: [birth year] Referring Provider:  Jonathon Jordan, MD  Encounter Date: 09/23/2014      PT End of Session - 09/23/14 1652    Visit Number 2   Date for PT Re-Evaluation 11/16/14   PT Start Time 1630   PT Stop Time 1700   PT Time Calculation (min) 30 min   Activity Tolerance Patient tolerated treatment well   Behavior During Therapy [facility] for tasks assessed/performed      Past Medical History  Diagnosis Date  . Hypertension   . Glaucoma   . Colon polyps   . Crohn disease 1995  . Sleep apnea   . IBS (irritable bowel syndrome)     Past Surgical History  Procedure Laterality Date  . Eye surgery Bilateral     part. detached retina  . Cataract extraction Left     lens inplant    There were no vitals filed for this visit.  Visit Diagnosis:  Knee pain, bilateral  Stiffness of knee joint, unspecified laterality  Abnormality of gait      Subjective Assessment - 09/23/14 1633    Subjective Pt presents to PT with complaints of bilateral knee pain that began 07/2014 without incident or injury.     Currently in Pain? Yes   Pain Score 2    Pain Location Knee   Pain Orientation Right;Left  Lt knee ususally more painful than Rt   Pain Descriptors / Indicators Aching;Sore;Tightness   Pain Type Chronic pain   Pain Onset More than a month ago   Multiple Pain Sites No                         [facility] Adult PT Treatment/Exercise - 09/23/14 0001    Knee/Hip Exercises: Stretches   Active Hamstring Stretch 3 reps;20 seconds  each leg,in standing, and supine needs vc's for technique   Passive Hamstring Stretch 3 reps;20 seconds  Active Hamstring stretch on stairs each leg   Soleus Stretch 3 reps;20 seconds   Knee/Hip Exercises: Aerobic   Stationary Bike L1 x 6 min   Ankle Exercises: Stretches   Gastroc Stretch 3 reps;20 seconds  wall with flexed knee and step bil desecend of heels   Other Stretch Prostretch x3 with 20sec hold each leg                PT Education - 09/23/14 1651    Education provided Yes   Education Details Soleus stretch   Person(s) Educated Patient   Methods Handout;Demonstration   Comprehension Verbalized understanding;Returned demonstration          PT Short Term Goals - 09/21/14 1654    PT SHORT TERM GOAL #1   Title be independent in initial HEP   Time 4   Period Weeks   Status New   PT SHORT TERM GOAL #2   Title report a 30% reduction in frequency and intensity of knee pain with ADLs and work tasks           PT Long Term Goals - 09/21/14 1629    PT LONG TERM GOAL #1   Title be independent in advanced HEP   Time 8   Period Weeks   Status New   PT LONG TERM GOAL #2  Title reduce FOTO to < or = to 36% limitation   Time 8   Period Weeks   Status New   PT LONG TERM GOAL #3   Title report a 60% reduction in the frequency and intensity of knee pain with ADLs and work tasks   Time 8   Period Weeks   Status New   PT LONG TERM GOAL #4   Title demonstrate symmetry with ambulation on level surface   Time 8   Period Weeks   Status New               Plan - 09/23/14 1657    Clinical Impression Statement Pt with decreased gastrocnemius and Hamstrings flexibility and tight tissues. Pt will benefit from skillet PT to learn stretching techniques to improve flexibility, Manual and modalities PRN   Pt will benefit from skilled therapeutic intervention in order to improve on the following deficits Abnormal gait;Difficulty walking;Pain;Impaired flexibility;Increased fascial restricitons   Rehab Potential Good   PT Frequency 2x / week   PT Duration 8 weeks   PT Treatment/Interventions ADLs/Self Care Home Management;Therapeutic  activities;Patient/family education;Therapeutic exercise;Passive range of motion;Manual techniques;Gait training;Ultrasound;Neuromuscular re-education;Electrical Stimulation   PT Next Visit Plan Knee flexibility, strength, manual and modalities as needed.    Consulted and Agree with Plan of Care Patient        Problem List Patient Active Problem List   Diagnosis Date Noted  . IRRITABLE BOWEL SYNDROME 05/23/2010  . OBESITY, UNSPECIFIED 03/15/2009  . HEMANGIOMA, HEPATIC 09/06/2007  . HYPERTENSION 09/06/2007    NAUMANN-HOUEGNIFIO,[name] PTA 09/23/2014, 5:10 PM  [facility] [address] [place], [place], [address] Phone: [phone]   Fax:  [phone]

## 2014-09-29 ENCOUNTER — Ambulatory Visit: Payer: 59 | Admitting: Physical Therapy

## 2014-09-29 ENCOUNTER — Encounter: Payer: Self-pay | Admitting: Physical Therapy

## 2014-09-29 DIAGNOSIS — M25561 Pain in right knee: Secondary | ICD-10-CM

## 2014-09-29 DIAGNOSIS — R269 Unspecified abnormalities of gait and mobility: Secondary | ICD-10-CM

## 2014-09-29 DIAGNOSIS — M25669 Stiffness of unspecified knee, not elsewhere classified: Secondary | ICD-10-CM

## 2014-09-29 DIAGNOSIS — M25562 Pain in left knee: Principal | ICD-10-CM

## 2014-09-29 NOTE — Therapy (Signed)
[facility] [address] [place], [place], [address] Phone: [phone]   Fax:  [phone]  Physical Therapy Treatment  Patient Details  Name: [place] MRN: [id number] Date of Birth: 1970-08-11 Referring Provider:  Jonathon Jordan, MD  Encounter Date: 09/29/2014      PT End of Session - 09/29/14 1709    Visit Number 3   Date for PT Re-Evaluation 11/16/14   PT Start Time 1627   PT Stop Time 1700   PT Time Calculation (min) 33 min   Activity Tolerance Patient tolerated treatment well   Behavior During Therapy [facility] for tasks assessed/performed      Past Medical History  Diagnosis Date  . Hypertension   . Glaucoma   . Colon polyps   . Crohn disease 1995  . Sleep apnea   . IBS (irritable bowel syndrome)     Past Surgical History  Procedure Laterality Date  . Eye surgery Bilateral     part. detached retina  . Cataract extraction Left     lens inplant    There were no vitals filed for this visit.  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 Yes   Pain Score 3    Pain Location Knee   Pain Orientation Right;Left   Pain Descriptors / Indicators Sore;Aching;Tightness   Pain Type Chronic pain   Pain Onset More than a month ago   Aggravating Factors  prolonged sitting, standing and walking, gettingstarted to  move after prolonged positioning   Pain Relieving Factors unknown   Multiple Pain Sites No                         [facility] Adult PT Treatment/Exercise - 09/29/14 0001    Knee/Hip Exercises: Stretches   Active  Hamstring Stretch 3 reps;20 seconds  each leg in standing at stairs and supine with strap   Soleus Stretch 3 reps;20 seconds  each leg   Knee/Hip Exercises: Aerobic   Stationary Bike L1 x 6 min   Manual Therapy   Manual Therapy Myofascial release  STW bil to Hamstrings and patella mob    Ankle Exercises: Stretches   Other Psychiatrist x3 with 20sec hold each leg                  PT Short Term Goals - 09/29/14 1712    PT SHORT TERM GOAL #1   Title be independent in initial HEP   Time 4   Period Weeks   Status On-going   PT SHORT TERM GOAL #2   Title report a 30% reduction in frequency and intensity of knee pain with ADLs and work tasks   Time 4   Period Weeks   Status On-going           PT Long Term Goals - 09/29/14 1712    PT LONG TERM GOAL #1   Title be independent in advanced HEP   Time 8   Period Weeks   Status On-going   PT LONG TERM GOAL #2  Title reduce FOTO to < or = to 36% limitation   Time 8   Period Weeks   Status On-going   PT LONG TERM GOAL #3   Title report a 60% reduction in the frequency and intensity of knee pain with ADLs and work tasks   Time 8   Period Weeks   Status On-going   PT LONG TERM GOAL #4   Title demonstrate symmetry with ambulation on level surface   Time 8   Period Weeks   Status On-going               Plan - 09/29/14 1710    Clinical Impression Statement Pt with good demo of stretching exercises, but decreased hamstring flexibility   Pt will benefit from skilled therapeutic intervention in order to improve on the following deficits Abnormal gait;Difficulty walking;Pain;Impaired flexibility;Increased fascial restricitons   Rehab Potential Good   PT Frequency 2x / week   PT Duration 8 weeks   PT Treatment/Interventions ADLs/Self Care Home Management;Therapeutic activities;Patient/family education;Therapeutic exercise;Passive range of motion;Manual techniques;Gait training;Ultrasound;Neuromuscular  re-education;Electrical Stimulation   PT Next Visit Plan Knee flexibility, strength, manual and modalities as needed.    Consulted and Agree with Plan of Care Patient        Problem List Patient Active Problem List   Diagnosis Date Noted  . IRRITABLE BOWEL SYNDROME 05/23/2010  . OBESITY, UNSPECIFIED 03/15/2009  . HEMANGIOMA, HEPATIC 09/06/2007  . HYPERTENSION 09/06/2007    NAUMANN-HOUEGNIFIO,Jareli Highland PTA 09/29/2014, 5:17 PM  [facility] [address] [place], [place], [address] Phone: [phone]   Fax:  [phone]

## 2014-10-05 ENCOUNTER — Encounter: Payer: Self-pay | Admitting: Physician Assistant

## 2014-10-05 ENCOUNTER — Other Ambulatory Visit (INDEPENDENT_AMBULATORY_CARE_PROVIDER_SITE_OTHER): Payer: 59

## 2014-10-05 ENCOUNTER — Ambulatory Visit (INDEPENDENT_AMBULATORY_CARE_PROVIDER_SITE_OTHER): Payer: 59 | Admitting: Physician Assistant

## 2014-10-05 ENCOUNTER — Encounter: Payer: Self-pay | Admitting: Physical Therapy

## 2014-10-05 ENCOUNTER — Ambulatory Visit: Payer: 59 | Attending: Family Medicine | Admitting: Physical Therapy

## 2014-10-05 VITALS — BP 140/96 | HR 60 | Ht 73.0 in | Wt 273.8 lb

## 2014-10-05 DIAGNOSIS — G4730 Sleep apnea, unspecified: Secondary | ICD-10-CM | POA: Diagnosis not present

## 2014-10-05 DIAGNOSIS — M25561 Pain in right knee: Secondary | ICD-10-CM | POA: Diagnosis not present

## 2014-10-05 DIAGNOSIS — R945 Abnormal results of liver function studies: Principal | ICD-10-CM

## 2014-10-05 DIAGNOSIS — R269 Unspecified abnormalities of gait and mobility: Secondary | ICD-10-CM | POA: Insufficient documentation

## 2014-10-05 DIAGNOSIS — R7989 Other specified abnormal findings of blood chemistry: Secondary | ICD-10-CM

## 2014-10-05 DIAGNOSIS — M2560 Stiffness of unspecified joint, not elsewhere classified: Secondary | ICD-10-CM | POA: Insufficient documentation

## 2014-10-05 DIAGNOSIS — K5090 Crohn's disease, unspecified, without complications: Secondary | ICD-10-CM | POA: Insufficient documentation

## 2014-10-05 DIAGNOSIS — R109 Unspecified abdominal pain: Secondary | ICD-10-CM | POA: Diagnosis not present

## 2014-10-05 DIAGNOSIS — K589 Irritable bowel syndrome without diarrhea: Secondary | ICD-10-CM | POA: Insufficient documentation

## 2014-10-05 DIAGNOSIS — M25669 Stiffness of unspecified knee, not elsewhere classified: Secondary | ICD-10-CM

## 2014-10-05 DIAGNOSIS — M25562 Pain in left knee: Secondary | ICD-10-CM | POA: Insufficient documentation

## 2014-10-05 DIAGNOSIS — Z8719 Personal history of other diseases of the digestive system: Secondary | ICD-10-CM

## 2014-10-05 DIAGNOSIS — H409 Unspecified glaucoma: Secondary | ICD-10-CM | POA: Insufficient documentation

## 2014-10-05 DIAGNOSIS — I10 Essential (primary) hypertension: Secondary | ICD-10-CM | POA: Diagnosis not present

## 2014-10-05 LAB — HEPATIC FUNCTION PANEL
ALK PHOS: 37 U/L — AB (ref 39–117)
ALT: 42 U/L (ref 0–53)
AST: 35 U/L (ref 0–37)
Albumin: 4.1 g/dL (ref 3.5–5.2)
Bilirubin, Direct: 0.2 mg/dL (ref 0.0–0.3)
Total Bilirubin: 1 mg/dL (ref 0.2–1.2)
Total Protein: 7.1 g/dL (ref 6.0–8.3)

## 2014-10-05 MED ORDER — ONDANSETRON HCL 4 MG PO TABS: 4.0000 mg | ORAL_TABLET | Freq: Three times a day (TID) | ORAL | Status: DC | PRN

## 2014-10-05 MED ORDER — HYDROCODONE-ACETAMINOPHEN 5-325 MG PO TABS: ORAL_TABLET | ORAL | Status: DC

## 2014-10-05 MED ORDER — OMEPRAZOLE-SODIUM BICARBONATE 40-1100 MG PO CAPS: 1.0000 | ORAL_CAPSULE | Freq: Every day | ORAL | Status: DC

## 2014-10-05 NOTE — Patient Instructions (Addendum)
Please go to the basement level to have your labs drawn.  We sent prescriptions to [place] [facility]. 1. Zegerid 2. Zofran ( Ondansetron)  We have given you a printed prescription for Vicodin.   Your physician here will be Dr. Zenovia Jarred.

## 2014-10-05 NOTE — Therapy (Signed)
[facility] [address] [place], [place], [address] Phone: [phone]   Fax:  [phone]  Physical Therapy Treatment  Patient Details  Name: [place] MRN: [id number] Date of Birth: May 09, 1971 Referring Provider:  Jonathon Jordan, MD  Encounter Date: 10/05/2014      PT End of Session - 10/05/14 1631    Visit Number 4   Date for PT Re-Evaluation 11/16/14   PT Start Time 5885   PT Stop Time 1700   PT Time Calculation (min) 50 min   Activity Tolerance Patient tolerated treatment well   Behavior During Therapy [facility] for tasks assessed/performed      Past Medical History  Diagnosis Date  . Hypertension   . Glaucoma   . Colon polyps   . Crohn disease 1995  . Sleep apnea   . IBS (irritable bowel syndrome)     Past Surgical History  Procedure Laterality Date  . Eye surgery Bilateral     part. detached retina  . Cataract extraction Left     lens inplant    There were no vitals filed for this visit.  Visit Diagnosis:  Knee pain, bilateral  Stiffness of knee joint, unspecified laterality  Abnormality of gait      Subjective Assessment - 10/05/14 1622    Subjective Pt reports does not more hurt as often   Limitations Sitting;Walking   Diagnostic tests ultrasound to check for blood clots- negative   Patient Stated Goals reduce knee pain with standing and walking   Currently in Pain? Yes   Pain Score 1    Pain Location Knee   Pain Orientation Left   Pain Descriptors / Indicators Aching;Sore;Tightness   Pain Type Chronic pain   Pain Onset More than a month ago   Aggravating Factors  prolonged sitting, standing and walking, getting started to move after prolonged positioning   Pain Relieving Factors rest, stretching   Multiple Pain Sites No                         [facility] Adult PT Treatment/Exercise - 10/05/14 0001    Knee/Hip Exercises: Stretches   Active Hamstring  Stretch 3 reps;20 seconds  each leg at stairs   Soleus Stretch 3 reps;20 seconds   Knee/Hip Exercises: Aerobic   Stationary Bike L1 x 8 min   Knee/Hip Exercises: Standing   Other Standing Knee Exercises --  10x with 5 sec descending bil heels standing on 6"stair   Manual Therapy   Manual Therapy Myofascial release  and STW to Left HS and gastrocnemius, crossfriction insertio   Ankle Exercises: Stretches   Gastroc Stretch 3 reps;20 seconds   Other Stretch Prostretch x3 with 20sec hold each leg   Ankle Exercises: Machines for Strengthening   Cybex Leg Press St7, 125# 3 x 10 with both LE                  PT Short Term Goals - 10/05/14 1635    PT SHORT TERM GOAL #1   Title be independent in initial HEP   Time 4   Period Weeks   Status Achieved   PT SHORT TERM GOAL #2   Title report a 30% reduction in frequency and intensity of knee pain with ADLs and work tasks  reports 10% reduction   Time 4   Period Weeks   Status On-going           PT  Long Term Goals - 10/05/14 1640    PT LONG TERM GOAL #1   Title be independent in advanced HEP   Time 8   Period Weeks   Status On-going   PT LONG TERM GOAL #2   Title reduce FOTO to < or = to 36% limitation   Time 8   Period Weeks   Status On-going   PT LONG TERM GOAL #3   Title report a 60% reduction in the frequency and intensity of knee pain with ADLs and work tasks   Time 8   Period Weeks   Status On-going   PT LONG TERM GOAL #4   Title demonstrate symmetry with ambulation on level surface   Time 8   Period Weeks   Status On-going               Plan - 10/05/14 1634    Clinical Impression Statement Pt will continue to benefit from skilled therapy to establish HEP to help to inprove Hamstring flexibility   Pt will benefit from skilled therapeutic intervention in order to improve on the following deficits Abnormal gait;Difficulty walking;Pain;Impaired flexibility;Increased fascial restricitons   Rehab  Potential Good   PT Frequency 2x / week   PT Duration 8 weeks   PT Treatment/Interventions ADLs/Self Care Home Management;Therapeutic activities;Patient/family education;Therapeutic exercise;Passive range of motion;Manual techniques;Gait training;Ultrasound;Neuromuscular re-education;Electrical Stimulation   PT Next Visit Plan Knee flexibility, strength, manual and modalities as needed.    Consulted and Agree with Plan of Care Patient        Problem List Patient Active Problem List   Diagnosis Date Noted  . IRRITABLE BOWEL SYNDROME 05/23/2010  . OBESITY, UNSPECIFIED 03/15/2009  . HEMANGIOMA, HEPATIC 09/06/2007  . HYPERTENSION 09/06/2007    NAUMANN-HOUEGNIFIO,Anslee Micheletti PTA 10/05/2014, 5:04 PM  [facility] [address], [place], [place], [address] Phone: [phone]   Fax:  [phone]

## 2014-10-05 NOTE — Progress Notes (Addendum)
Patient ID: Andre Decker, male   DOB: 04-23-1971, 44 y.o.   MRN: [id number]   Subjective:    Patient ID: Andre Decker, male    DOB: 28-Nov-1970, 44 y.o.   MRN: [id number]  HPI Andre Decker is a pleasant 44 year old African-American male previously known to Dr. Sharlett Iles who was last seen here in February 2015. He comes in today for routine follow-up. He has history of recurrent episodes of upper abdominal pain nausea and vomiting and had undergone a previous fairly extensive workup. He had EGD and colonoscopy both in 2014 in both normal ultrasound negative CCK HIDA scan within normal limits CT scan of the abdomen and pelvis which did show a right hepatic hemangioma but was otherwise negative and a C1 esterase level to rule out angioedema was within normal limits. Patient says he has not had any problems over the past year and has been feeling well. Family history is negative for colon cancer positive for pancreatic cancer in paternal grandfather and an uncle. Patient mentions that he had an acute illness in January 2016 and was sick for about 3 weeks with fevers myalgias severe to fatigue shortness of breath etc. it was felt to have had a viral syndrome. He was told that his liver and spleen weren't enlarged at that time his last set of LFTs did show a mild transaminitis.  Review of Systems Pertinent positive and negative review of systems were noted in the above HPI section.  All other review of systems was otherwise negative.  Outpatient Encounter Prescriptions as of 10/05/2014  Medication Sig  . amLODipine (NORVASC) 5 MG tablet   . AZOPT 1 % ophthalmic suspension Place 1 drop into both eyes 2 (two) times daily.  . bimatoprost (LUMIGAN) 0.01 % SOLN 1 drop at bedtime.  . brimonidine-timolol (COMBIGAN) 0.2-0.5 % ophthalmic solution Place 1 drop into both eyes at bedtime.   Marland Kitchen HYDROcodone-acetaminophen (NORCO/VICODIN) 5-325 MG per tablet Take 1 tab every 6 hours as needed.  Marland Kitchen ibuprofen  (ADVIL,MOTRIN) 800 MG tablet Take 1 tablet (800 mg total) by mouth 3 (three) times daily.  Marland Kitchen lisinopril-hydrochlorothiazide (PRINZIDE,ZESTORETIC) 20-12.5 MG per tablet Take 1 tablet by mouth daily.   Marland Kitchen UNKNOWN TO PATIENT Pt states he was just started on a new HTN medication and is unable to recall the name of the medication.  . [DISCONTINUED] HYDROcodone-acetaminophen (NORCO/VICODIN) 5-325 MG per tablet Take 1 tablet by mouth every 4 (four) hours as needed.  Marland Kitchen omeprazole-sodium bicarbonate (ZEGERID) 40-1100 MG per capsule Take 1 capsule by mouth daily before breakfast.  . ondansetron (ZOFRAN) 4 MG tablet Take 1 tablet (4 mg total) by mouth every 8 (eight) hours as needed for nausea or vomiting.  . [DISCONTINUED] albuterol (PROVENTIL HFA;VENTOLIN HFA) 108 (90 BASE) MCG/ACT inhaler Inhale 2 puffs into the lungs every 2 (two) hours as needed for wheezing or shortness of breath (cough). (Patient not taking: Reported on 09/21/2014)  . [DISCONTINUED] amlodipine-benazepril (LOTREL) 2.5-10 MG per capsule Take 1 capsule by mouth daily.  . [DISCONTINUED] cyclobenzaprine (FLEXERIL) 10 MG tablet Take 10 mg by mouth 3 (three) times daily as needed for muscle spasms.  . [DISCONTINUED] diazepam (VALIUM) 5 MG tablet Take 1 tablet (5 mg total) by mouth every 12 (twelve) hours as needed for muscle spasms. (Patient not taking: Reported on 09/21/2014)  . [DISCONTINUED] etodolac (LODINE) 400 MG tablet Take 400 mg by mouth 2 (two) times daily.  . [DISCONTINUED] levofloxacin (LEVAQUIN) 750 MG tablet Take 1 tablet (750 mg total) by mouth  daily. X 7 days  . [DISCONTINUED] naproxen (NAPROSYN) 250 MG tablet Take by mouth 2 (two) times daily with a meal.  . [DISCONTINUED] travoprost, benzalkonium, (TRAVATAN) 0.004 % ophthalmic solution Place 1 drop into both eyes at bedtime.    No facility-administered encounter medications on file as of 10/05/2014.   No Known Allergies Patient Active Problem List   Diagnosis Date Noted  .  IRRITABLE BOWEL SYNDROME 05/23/2010  . OBESITY, UNSPECIFIED 03/15/2009  . HEMANGIOMA, HEPATIC 09/06/2007  . HYPERTENSION 09/06/2007   History   Social History  . Marital Status: Married    Spouse Name: N/A  . Number of Children: 1  . Years of Education: N/A   Occupational History  . [employer]   Social History Main Topics  . Smoking status: Never Smoker   . Smokeless tobacco: Never Used  . Alcohol Use: No  . Drug Use: No  . Sexual Activity: Not on file   Other Topics Concern  . Not on file   Social History Narrative    Mr. Andre Decker's family history includes Migraines in his mother; Prostate cancer in his maternal grandfather and maternal uncle. There is no history of Colon cancer.      Objective:    Filed Vitals:   10/05/14 0837  BP: 140/96  Pulse: 60    Physical Exam well-developed African-American male in no acute distress, blood pressure 140/96 pulse 60 height 6 foot 1 weight 273. HEENT; nontraumatic normocephalic EOMI PERRLA sclera anicteric, Supple; no JVD, Cardiovascular; regular rate and rhythm with S1-S2 no murmur rub or gallop, Pulmonary; clear bilaterally, Abdomen ;obese soft nontender nondistended bowel sounds are active no palpable mass or hepatosplenomegaly, Rectal ;exam not done, Extremities ;no clubbing cyanosis or edema skin warm dry, Psych; mood and affect appropriate       Assessment & Plan:   #1 44 yo AA male with hx of recurring upper abdominal pain, nausea and vomiting- no episodes over the past year and prior workup negative. ?functional dyspepsia.. #2 obesity #3 HTN  Plan;Will refill Zofran, Zegerid, and Vicodin #20 /0 refills for pt to use prn,only for recurrent episodes Repeat hepatic panel Follow up with Dr Andre Decker or myself as needed.   Andre S Esterwood PA-C 10/05/2014   Cc: Andre Jordan, MD  Addendum: Reviewed and agree with initial management. Andre Bears, MD

## 2014-10-19 ENCOUNTER — Ambulatory Visit: Payer: 59 | Admitting: Physical Therapy

## 2014-10-19 ENCOUNTER — Encounter: Payer: Self-pay | Admitting: Physical Therapy

## 2014-10-19 DIAGNOSIS — R269 Unspecified abnormalities of gait and mobility: Secondary | ICD-10-CM

## 2014-10-19 DIAGNOSIS — M25562 Pain in left knee: Principal | ICD-10-CM

## 2014-10-19 DIAGNOSIS — M25561 Pain in right knee: Secondary | ICD-10-CM | POA: Diagnosis not present

## 2014-10-19 DIAGNOSIS — M25669 Stiffness of unspecified knee, not elsewhere classified: Secondary | ICD-10-CM

## 2014-10-19 NOTE — Therapy (Signed)
[facility] [address], [place], [place], [address] Phone: [phone]   Fax:  [phone]  Physical Therapy Treatment  Patient Details  Name: Andre Decker MRN: [id number] Date of Birth: December 23, 1970 Referring Provider:  Jonathon Jordan, MD  Encounter Date: 10/19/2014      PT End of Session - 10/19/14 1710    Visit Number 5   Date for PT Re-Evaluation 11/16/14   PT Start Time 5035   PT Stop Time 0100   PT Time Calculation (min) 584 min   Activity Tolerance Patient tolerated treatment well   Behavior During Therapy [facility] for tasks assessed/performed      Past Medical History  Diagnosis Date  . Hypertension   . Glaucoma   . Colon polyps   . Crohn disease 1995  . Sleep apnea   . IBS (irritable bowel syndrome)     Past Surgical History  Procedure Laterality Date  . Eye surgery Bilateral     part. detached retina  . Cataract extraction Left     lens inplant    There were no vitals filed for this visit.  Visit Diagnosis:  Knee pain, bilateral  Stiffness of knee joint, unspecified laterality  Abnormality of gait      Subjective Assessment - 10/19/14 1639    Subjective Patient reports edema and sorness in left knee    Limitations Sitting;Walking   Patient Stated Goals reduce knee pain with standing and walking   Currently in Pain? Yes   Pain Score 5    Pain Location Knee   Pain Orientation Left   Pain Descriptors / Indicators Aching;Sore   Pain Type Chronic pain   Pain Onset More than a month ago   Multiple Pain Sites No                         [facility] Adult PT Treatment/Exercise - 10/19/14 0001    Knee/Hip Exercises: Stretches   Active Hamstring Stretch 3 reps;20 seconds  on stairs and in supine each   Soleus Stretch 3 reps;20 seconds   Knee/Hip Exercises: Aerobic   Stationary Bike L1 x 10 min   Manual Therapy   Manual Therapy Myofascial release  to bil  gastrocnemius, contract relax bil for achillestendon    Ankle Exercises: Machines for Strengthening   Cybex Leg Press St7, 125# 3 x 10 with both LE   Ankle Exercises: Stretches   Other Psychiatrist x3 with 20sec hold each leg                  PT Short Term Goals - 10/05/14 1635    PT SHORT TERM GOAL #1   Title be independent in initial HEP   Time 4   Period Weeks   Status Achieved   PT SHORT TERM GOAL #2   Title report a 30% reduction in frequency and intensity of knee pain with ADLs and work tasks  reports 10% reduction   Time 4   Period Weeks   Status On-going           PT Long Term Goals - 10/19/14 1715    PT LONG TERM GOAL #1   Title be independent in advanced HEP   Time 8   Period Weeks   Status On-going   PT LONG TERM GOAL #2   Title reduce FOTO to < or = to 36% limitation   Time 8   Period Weeks  Status On-going   PT LONG TERM GOAL #3   Title report a 60% reduction in the frequency and intensity of knee pain with ADLs and work tasks   Time 8   Period Weeks   Status On-going   PT LONG TERM GOAL #4   Title demonstrate symmetry with ambulation on level surface   Time 8   Period Weeks   Status On-going               Plan - 10/19/14 1711    Clinical Impression Statement Pt  with increased muscle tone in Hamstrings and gastrocnemius was alters gait and he will continue to benefit from  PT   Pt will benefit from skilled therapeutic intervention in order to improve on the following deficits Abnormal gait;Difficulty walking;Pain;Impaired flexibility;Increased fascial restricitons   Rehab Potential Good   PT Frequency 2x / week   PT Duration 8 weeks   PT Treatment/Interventions ADLs/Self Care Home Management;Therapeutic activities;Patient/family education;Therapeutic exercise;Passive range of motion;Manual techniques;Gait training;Ultrasound;Neuromuscular re-education;Electrical Stimulation   PT Next Visit Plan May try Ultrasound, e-stim  to Lt knee, strength, manual therapy as soft tissue and manual stretching    Consulted and Agree with Plan of Care Patient        Problem List Patient Active Problem List   Diagnosis Date Noted  . IRRITABLE BOWEL SYNDROME 05/23/2010  . OBESITY, UNSPECIFIED 03/15/2009  . HEMANGIOMA, HEPATIC 09/06/2007  . HYPERTENSION 09/06/2007    NAUMANN-HOUEGNIFIO,Andre Decker PTA 10/19/2014, 5:18 PM  [facility] [address] [place], [place], [address] Phone: [phone]   Fax:  [phone]

## 2014-10-21 ENCOUNTER — Ambulatory Visit: Payer: 59 | Admitting: Physical Therapy

## 2014-10-21 ENCOUNTER — Encounter: Payer: Self-pay | Admitting: Physical Therapy

## 2014-10-21 DIAGNOSIS — R269 Unspecified abnormalities of gait and mobility: Secondary | ICD-10-CM

## 2014-10-21 DIAGNOSIS — M25561 Pain in right knee: Secondary | ICD-10-CM

## 2014-10-21 DIAGNOSIS — M25669 Stiffness of unspecified knee, not elsewhere classified: Secondary | ICD-10-CM

## 2014-10-21 DIAGNOSIS — M25562 Pain in left knee: Principal | ICD-10-CM

## 2014-10-21 NOTE — Therapy (Signed)
[facility] [address], [place], [place], [address] Phone: [phone]   Fax:  [phone]  Physical Therapy Treatment  Patient Details  Name: Andre Decker MRN: [id number] Date of Birth: 1970-09-19 Referring Provider:  Jonathon Jordan, MD  Encounter Date: 10/21/2014      PT End of Session - 10/21/14 1631    Visit Number 6   Date for PT Re-Evaluation 11/16/14   PT Start Time 3016   PT Stop Time 1703   PT Time Calculation (min) 47 min   Activity Tolerance Patient tolerated treatment well   Behavior During Therapy [facility] for tasks assessed/performed      Past Medical History  Diagnosis Date  . Hypertension   . Glaucoma   . Colon polyps   . Crohn disease 1995  . Sleep apnea   . IBS (irritable bowel syndrome)     Past Surgical History  Procedure Laterality Date  . Eye surgery Bilateral     part. detached retina  . Cataract extraction Left     lens inplant    There were no vitals filed for this visit.  Visit Diagnosis:  Knee pain, bilateral  Stiffness of knee joint, unspecified laterality  Abnormality of gait      Subjective Assessment - 10/21/14 1622    Subjective Pt still with sorness and swelling in left knee   Limitations Sitting;Walking   Currently in Pain? Yes   Pain Score 4    Pain Location Knee   Pain Orientation Left   Pain Descriptors / Indicators Aching;Sore   Pain Type Chronic pain   Pain Onset More than a month ago   Aggravating Factors  increased activity level, prolonged sitting, standing and walking, getting started to move after prolonged positions    Pain Relieving Factors rest stretching   Multiple Pain Sites No                         [facility] Adult PT Treatment/Exercise - 10/21/14 0001    Knee/Hip Exercises: Stretches   Active Hamstring Stretch 3 reps;20 seconds  on stairs   Soleus Stretch 3 reps;20 seconds  each leg   Knee/Hip Exercises: Aerobic    Stationary Bike L2 x 63min   Knee/Hip Exercises: Standing   Other Standing Knee Exercises descending   Modalities   Modalities Ultrasound   Ultrasound   Ultrasound Location suprapatellar   Ultrasound Parameters 100%, 3Mhz, 1Wcm   Ultrasound Goals Edema   Manual Therapy   Manual Therapy Soft tissue mobilization;Other (comment)  surrounding lat/med & suprapatellar, patellar mob caudal   Ankle Exercises: Stretches   Other Stretch Prostretch x3 with 20sec hold each leg                  PT Short Term Goals - 10/05/14 1635    PT SHORT TERM GOAL #1   Title be independent in initial HEP   Time 4   Period Weeks   Status Achieved   PT SHORT TERM GOAL #2   Title report a 30% reduction in frequency and intensity of knee pain with ADLs and work tasks  reports 10% reduction   Time 4   Period Weeks   Status On-going           PT Long Term Goals - 10/19/14 1715    PT LONG TERM GOAL #1   Title be independent in advanced HEP   Time 8   Period  Weeks   Status On-going   PT LONG TERM GOAL #2   Title reduce FOTO to < or = to 36% limitation   Time 8   Period Weeks   Status On-going   PT LONG TERM GOAL #3   Title report a 60% reduction in the frequency and intensity of knee pain with ADLs and work tasks   Time 8   Period Weeks   Status On-going   PT LONG TERM GOAL #4   Title demonstrate symmetry with ambulation on level surface   Time 8   Period Weeks   Status On-going               Plan - 10/21/14 1632    Clinical Impression Statement Pt shows good demo with his stretching exercises, but due to very tight Hamstrings and gastrocnemius needs to continue to praticipate with PT    Pt will benefit from skilled therapeutic intervention in order to improve on the following deficits Abnormal gait;Difficulty walking;Pain;Impaired flexibility;Increased fascial restricitons   Rehab Potential Good   PT Frequency 2x / week   PT Duration 8 weeks   PT Next Visit Plan  Continue with [place] if beneficial, stretching Hamstrings, quad, gastrocnemius & soleus   Consulted and Agree with Plan of Care Patient        Problem List Patient Active Problem List   Diagnosis Date Noted  . IRRITABLE BOWEL SYNDROME 05/23/2010  . OBESITY, UNSPECIFIED 03/15/2009  . HEMANGIOMA, HEPATIC 09/06/2007  . HYPERTENSION 09/06/2007    NAUMANN-HOUEGNIFIO,Kinda Pottle PTA 10/21/2014, 5:02 PM  [facility] [address], [place], [place], [address] Phone: [phone]   Fax:  [phone]

## 2014-10-25 ENCOUNTER — Encounter: Payer: Self-pay | Admitting: Physical Therapy

## 2014-10-25 ENCOUNTER — Ambulatory Visit: Payer: 59 | Admitting: Physical Therapy

## 2014-10-25 DIAGNOSIS — M25562 Pain in left knee: Principal | ICD-10-CM

## 2014-10-25 DIAGNOSIS — M25561 Pain in right knee: Secondary | ICD-10-CM

## 2014-10-25 DIAGNOSIS — M25669 Stiffness of unspecified knee, not elsewhere classified: Secondary | ICD-10-CM

## 2014-10-25 DIAGNOSIS — R269 Unspecified abnormalities of gait and mobility: Secondary | ICD-10-CM

## 2014-10-25 NOTE — Therapy (Signed)
[facility] [address], [place], [place], [address] Phone: [phone]   Fax:  [phone]  Physical Therapy Treatment  Patient Details  Name: Andre Decker MRN: [id number] Date of Birth: 27-Sep-1970 Referring Provider:  Jonathon Jordan, MD  Encounter Date: 10/25/2014      PT End of Session - 10/25/14 1638    Visit Number 7   Date for PT Re-Evaluation 11/16/14   PT Start Time 1632  pt arrived late   PT Stop Time 1703   PT Time Calculation (min) 31 min   Activity Tolerance Patient tolerated treatment well   Behavior During Therapy [facility] for tasks assessed/performed      Past Medical History  Diagnosis Date  . Hypertension   . Glaucoma   . Colon polyps   . Crohn disease 1995  . Sleep apnea   . IBS (irritable bowel syndrome)     Past Surgical History  Procedure Laterality Date  . Eye surgery Bilateral     part. detached retina  . Cataract extraction Left     lens inplant    There were no vitals filed for this visit.  Visit Diagnosis:  Knee pain, bilateral  Stiffness of knee joint, unspecified laterality  Abnormality of gait      Subjective Assessment - 10/25/14 1635    Subjective There are good day's and bad days   Limitations Sitting;Walking   Currently in Pain? Yes   Pain Score 3    Pain Location Knee   Pain Orientation Left   Pain Descriptors / Indicators Aching;Sore   Pain Type Chronic pain   Pain Onset More than a month ago   Aggravating Factors  prolonged positions as standing or sitting and walking.   Multiple Pain Sites No                         [facility] Adult PT Treatment/Exercise - 10/25/14 0001    Knee/Hip Exercises: Stretches   Active Hamstring Stretch 3 reps;20 seconds;Other (comment)  each leg on stairs   Passive Hamstring Stretch 3 reps  hold relaxe each leg x 3 by PTA    Soleus Stretch 3 reps;20 seconds;Other (comment)  each leg   Knee/Hip  Exercises: Aerobic   Stationary Bike L2 x 3min   Knee/Hip Exercises: Standing   Other Standing Knee Exercises descending  bil heels with 20 sec hold x 5   Modalities   Modalities --  no [place] today pt said did not make a difference   Manual Therapy   Manual Therapy Soft tissue mobilization  gastrocnemius and distal hamstring Lt LE   Ankle Exercises: Stretches   Other Stretch Prostretch x3 with 20sec hold each leg                  PT Short Term Goals - 10/25/14 1642    PT SHORT TERM GOAL #1   Title be independent in initial HEP   Time 4   Period Weeks   Status Achieved   PT SHORT TERM GOAL #2   Title report a 30% reduction in frequency and intensity of knee pain with ADLs and work tasks   Time 4   Period Weeks   Status On-going           PT Long Term Goals - 10/25/14 1643    PT LONG TERM GOAL #1   Title be independent in advanced HEP   Time 8  Period Weeks   Status On-going   PT LONG TERM GOAL #2   Title reduce FOTO to < or = to 36% limitation   Time 8   Period Weeks   Status On-going   PT LONG TERM GOAL #3   Title report a 60% reduction in the frequency and intensity of knee pain with ADLs and work tasks   Time 8   Period Weeks   Status On-going   PT LONG TERM GOAL #4   Title demonstrate symmetry with ambulation on level surface   Time 8   Period Weeks   Status On-going               Plan - 10/25/14 1640    Clinical Impression Statement Pt continues to benefit from skilled PT due to tight hamstrings and gastrocnemius   Rehab Potential Good   PT Frequency 2x / week   PT Duration 8 weeks   PT Treatment/Interventions ADLs/Self Care Home Management;Therapeutic activities;Patient/family education;Therapeutic exercise;Passive range of motion;Manual techniques;Gait training;Ultrasound;Neuromuscular re-education;Electrical Stimulation   PT Next Visit Plan stretching Hamstrings, quad, gastrocnemius & soleus   Consulted and Agree with Plan of Care  Patient        Problem List Patient Active Problem List   Diagnosis Date Noted  . IRRITABLE BOWEL SYNDROME 05/23/2010  . OBESITY, UNSPECIFIED 03/15/2009  . HEMANGIOMA, HEPATIC 09/06/2007  . HYPERTENSION 09/06/2007    NAUMANN-HOUEGNIFIO,Aybree Lanyon PTA 10/25/2014, 5:08 PM  [facility] [address] [place], [place], [address] Phone: [phone]   Fax:  [phone]

## 2014-10-27 ENCOUNTER — Encounter: Payer: Self-pay | Admitting: Physical Therapy

## 2014-10-27 ENCOUNTER — Ambulatory Visit: Payer: 59 | Admitting: Physical Therapy

## 2014-10-27 DIAGNOSIS — R269 Unspecified abnormalities of gait and mobility: Secondary | ICD-10-CM

## 2014-10-27 DIAGNOSIS — M25562 Pain in left knee: Principal | ICD-10-CM

## 2014-10-27 DIAGNOSIS — M25669 Stiffness of unspecified knee, not elsewhere classified: Secondary | ICD-10-CM

## 2014-10-27 DIAGNOSIS — M25561 Pain in right knee: Secondary | ICD-10-CM

## 2014-10-27 NOTE — Therapy (Signed)
[facility] [address] [place], [place], [address] Phone: [phone]   Fax:  [phone]  Physical Therapy Treatment  Patient Details  Name: Andre Decker MRN: [id number] Date of Birth: 07/21/1970 Referring Provider:  Jonathon Jordan, MD  Encounter Date: 10/27/2014      PT End of Session - 10/27/14 1636    Visit Number 8   Date for PT Re-Evaluation 11/16/14   PT Start Time 1612   PT Stop Time 1650   PT Time Calculation (min) 38 min   Activity Tolerance Patient tolerated treatment well   Behavior During Therapy [facility] for tasks assessed/performed      Past Medical History  Diagnosis Date  . Hypertension   . Glaucoma   . Colon polyps   . Crohn disease 1995  . Sleep apnea   . IBS (irritable bowel syndrome)     Past Surgical History  Procedure Laterality Date  . Eye surgery Bilateral     part. detached retina  . Cataract extraction Left     lens inplant    There were no vitals filed for this visit.  Visit Diagnosis:  Knee pain, bilateral  Stiffness of knee joint, unspecified laterality  Abnormality of gait      Subjective Assessment - 10/27/14 1614    Subjective Average day for pain.   Currently in Pain? Yes   Pain Score 4    Pain Location Knee   Pain Orientation Left;Right  LT > RT   Pain Descriptors / Indicators Aching   Aggravating Factors  Just depends   Pain Relieving Factors Rest, stretch   Multiple Pain Sites No                         [facility] Adult PT Treatment/Exercise - 10/27/14 0001    Knee/Hip Exercises: Stretches   Active Hamstring Stretch 3 reps;20 seconds;Other (comment)  each leg on stairs   Gastroc Stretch 3 reps;20 seconds  Inconjunction with ham stretch on stairs   Knee/Hip Exercises: Aerobic   Stationary Bike L2 x 8 min   Knee/Hip Exercises: Standing   Forward Step Up Both;1 set;10 reps   Rocker Board 3 minutes   Walking with Sports Cord  30# fwd/bkward 10x    Ankle Exercises: Machines for Strengthening   Cybex Leg Press St 7 Bil 125# 10 x                PT Education - 10/27/14 1636    Education provided Yes   Education Details Use of ice   Person(s) Educated Patient   Methods Explanation   Comprehension Verbalized understanding          PT Short Term Goals - 10/25/14 1642    PT SHORT TERM GOAL #1   Title be independent in initial HEP   Time 4   Period Weeks   Status Achieved   PT SHORT TERM GOAL #2   Title report a 30% reduction in frequency and intensity of knee pain with ADLs and work tasks   Time 4   Period Weeks   Status On-going           PT Long Term Goals - 10/25/14 1643    PT LONG TERM GOAL #1   Title be independent in advanced HEP   Time 8   Period Weeks   Status On-going   PT LONG TERM GOAL #2   Title reduce FOTO to <  or = to 36% limitation   Time 8   Period Weeks   Status On-going   PT LONG TERM GOAL #3   Title report a 60% reduction in the frequency and intensity of knee pain with ADLs and work tasks   Time 8   Period Weeks   Status On-going   PT LONG TERM GOAL #4   Title demonstrate symmetry with ambulation on level surface   Time 8   Period Weeks   Status On-going               Plan - 10/27/14 1637    Clinical Impression Statement Adavnced exercises today. Did well, no increase of pain. Encouraged pt to be patient.    Pt will benefit from skilled therapeutic intervention in order to improve on the following deficits Abnormal gait;Difficulty walking;Pain;Impaired flexibility;Increased fascial restricitons   Rehab Potential Good   PT Frequency 2x / week   PT Duration 8 weeks   PT Treatment/Interventions ADLs/Self Care Home Management;Therapeutic activities;Patient/family education;Therapeutic exercise;Passive range of motion;Manual techniques;Gait training;Ultrasound;Neuromuscular re-education;Electrical Stimulation   PT Next Visit Plan Stretching and knee  strength   Consulted and Agree with Plan of Care Patient        Problem List Patient Active Problem List   Diagnosis Date Noted  . IRRITABLE BOWEL SYNDROME 05/23/2010  . OBESITY, UNSPECIFIED 03/15/2009  . HEMANGIOMA, HEPATIC 09/06/2007  . HYPERTENSION 09/06/2007    Shrinika Blatz, PTA 10/27/2014, 4:41 PM  [facility] [address], [place], [place], [address] Phone: [phone]   Fax:  [phone]

## 2014-11-03 ENCOUNTER — Ambulatory Visit: Payer: 59 | Attending: Family Medicine | Admitting: Physical Therapy

## 2014-11-03 DIAGNOSIS — M25669 Stiffness of unspecified knee, not elsewhere classified: Secondary | ICD-10-CM

## 2014-11-03 DIAGNOSIS — M25561 Pain in right knee: Secondary | ICD-10-CM | POA: Diagnosis present

## 2014-11-03 DIAGNOSIS — M25562 Pain in left knee: Secondary | ICD-10-CM | POA: Diagnosis present

## 2014-11-03 NOTE — Patient Instructions (Signed)
  Hamstring Stretch, Reclined (Strap, Doorframe)   Lengthen bottom leg on floor. Extend top leg along edge of doorframe or press foot up into yoga strap. Hold for 60 seconds Repeat 2____ times each leg. 2 x per day.  Piriformis (Supine)  Cross legs, right on top. Gently pull other knee toward chest until stretch is felt in buttock/hip of top leg. Hold _60___ seconds. Repeat __2__ times per set both sides. Do ____ sets per session. Do _2___ sessions per day.   Quadriceps (Prone)   On stomach with sheet around ankles, knees together, hips down, pull heels toward bottom. Keep hips flat. Hold __60__ seconds. Repeat __2__ times. Do _2___ sessions per day. CAUTION: Stretch should be gentle, steady and slow.  Copyright  VHI. All rights reserved.    Madelyn Flavors, PT 11/03/2014 4:48 PM  [facility] [address] [place], [place] [address] Phone # [phone] Fax [phone]

## 2014-11-03 NOTE — Therapy (Addendum)
[facility] [address] [place], [place], [address] Phone: [phone]   Fax:  [phone]  Physical Therapy Treatment  Patient Details  Name: Andre Decker MRN: [id number] Date of Birth: 19-Aug-1970 Referring Provider:  Jonathon Jordan, MD  Encounter Date: 11/03/2014      PT End of Session - 11/03/14 1622    Visit Number 9   Date for PT Re-Evaluation 11/16/14   PT Start Time 1620   PT Stop Time 1700   PT Time Calculation (min) 40 min   Activity Tolerance Patient tolerated treatment well      Past Medical History  Diagnosis Date  . Hypertension   . Glaucoma   . Colon polyps   . Crohn disease 1995  . Sleep apnea   . IBS (irritable bowel syndrome)     Past Surgical History  Procedure Laterality Date  . Eye surgery Bilateral     part. detached retina  . Cataract extraction Left     lens inplant    There were no vitals filed for this visit.  Visit Diagnosis:  Knee pain, bilateral  Stiffness of knee joint, unspecified laterality      Subjective Assessment - 11/03/14 1622    Subjective Pain is about a 6. Varies from day to day.   Currently in Pain? Yes   Pain Score 6    Pain Location Knee   Pain Orientation Right;Left   Pain Descriptors / Indicators Aching   Pain Onset More than a month ago   Aggravating Factors  nothiing specific   Pain Relieving Factors rest   Effect of Pain on Daily Activities has to stop if too painful                         [facility] Adult PT Treatment/Exercise - 11/03/14 0001    Knee/Hip Exercises: Stretches   Passive Hamstring Stretch 2 reps;60 seconds   Quad Stretch 2 reps;60 seconds   Piriformis Stretch 2 reps;60 seconds   Knee/Hip Exercises: Aerobic   Elliptical L2 incline 1 x 5 min   Knee/Hip Exercises: Standing   Walking with Sports Cord 35# fwd/bkward 10x    Ankle Exercises: Machines for Strengthening   Cybex Leg Press St 7 Bil 125# 10 x  2                PT Education - 11/03/14 1659    Education provided Yes   Education Details HEP           PT Short Term Goals - 10/25/14 1642    PT SHORT TERM GOAL #1   Title be independent in initial HEP   Time 4   Period Weeks   Status Achieved   PT SHORT TERM GOAL #2   Title report a 30% reduction in frequency and intensity of knee pain with ADLs and work tasks   Time 4   Period Weeks   Status On-going           PT Long Term Goals - 10/25/14 1643    PT LONG TERM GOAL #1   Title be independent in advanced HEP   Time 8   Period Weeks   Status On-going   PT LONG TERM GOAL #2   Title reduce FOTO to < or = to 36% limitation   Time 8   Period Weeks   Status On-going   PT LONG TERM GOAL #3   Title report  a 60% reduction in the frequency and intensity of knee pain with ADLs and work tasks   Time 8   Period Weeks   Status On-going   PT LONG TERM GOAL #4   Title demonstrate symmetry with ambulation on level surface   Time 8   Period Weeks   Status On-going               Plan - 11/03/14 1706    Clinical Impression Statement Patient demos significant hip tightness in bil piriformis as well as HS and Quad tightness. Encouraged patient to be diligent with stretches as it will likely reduce knee pain. No goals met.    PT Next Visit Plan continue with hip/knee flexibility. Assess ITB and possible need for manual release.   Consulted and Agree with Plan of Care Patient        Problem List Patient Active Problem List   Diagnosis Date Noted  . IRRITABLE BOWEL SYNDROME 05/23/2010  . OBESITY, UNSPECIFIED 03/15/2009  . HEMANGIOMA, HEPATIC 09/06/2007  . HYPERTENSION 09/06/2007    Madelyn Flavors PT  11/03/2014, 5:10 PM PHYSICAL THERAPY DISCHARGE SUMMARY  Visits from Start of Care: 9  Current functional level related to goals / functional outcomes: Pt attended 9 PT sessions and didn't return for future PT sessions.  See above for current status  toward goals.     Remaining deficits: Unknown at this time as pt didn't return.  Pt has HEP in place to address deficits.  Thank you for this referral.     Education / Equipment: HEP Plan: Patient agrees to discharge.  Patient goals were partially met. Patient is being discharged due to not returning since the last visit.  ?????    Sigurd Sos, PT 12/02/2014 9:13 AM  [facility] [address] [place], [place], [address] Phone: [phone]   Fax:  [phone]

## 2015-03-25 ENCOUNTER — Telehealth: Payer: Self-pay

## 2015-03-25 NOTE — Telephone Encounter (Signed)
Dr Carlota Raspberry,  Patient dropped off vision exam today.  Placed in [employer].  TL  Best phone [phone]

## 2015-03-28 NOTE — Telephone Encounter (Signed)
In your box

## 2015-04-12 ENCOUNTER — Encounter: Payer: Self-pay | Admitting: Gastroenterology

## 2015-04-15 ENCOUNTER — Ambulatory Visit (INDEPENDENT_AMBULATORY_CARE_PROVIDER_SITE_OTHER): Payer: 59 | Admitting: Ophthalmology

## 2015-04-15 DIAGNOSIS — H35033 Hypertensive retinopathy, bilateral: Secondary | ICD-10-CM

## 2015-04-15 DIAGNOSIS — H43813 Vitreous degeneration, bilateral: Secondary | ICD-10-CM | POA: Diagnosis not present

## 2015-04-15 DIAGNOSIS — I10 Essential (primary) hypertension: Secondary | ICD-10-CM

## 2015-04-15 DIAGNOSIS — H33301 Unspecified retinal break, right eye: Secondary | ICD-10-CM

## 2015-04-15 DIAGNOSIS — H338 Other retinal detachments: Secondary | ICD-10-CM

## 2015-04-18 ENCOUNTER — Ambulatory Visit: Payer: 59 | Admitting: Neurology

## 2015-04-20 ENCOUNTER — Ambulatory Visit (INDEPENDENT_AMBULATORY_CARE_PROVIDER_SITE_OTHER): Payer: 59 | Admitting: Neurology

## 2015-04-20 ENCOUNTER — Encounter: Payer: Self-pay | Admitting: Neurology

## 2015-04-20 VITALS — BP 111/68 | HR 47 | Ht 72.0 in | Wt 279.0 lb

## 2015-04-20 DIAGNOSIS — R202 Paresthesia of skin: Secondary | ICD-10-CM | POA: Diagnosis not present

## 2015-04-20 HISTORY — DX: Paresthesia of skin: R20.2

## 2015-04-20 NOTE — Progress Notes (Signed)
Reason for visit: Foot numbness  Referring physician: Dr. Morton Amy [name] is a 44 y.o. male  History of present illness:  Andre Decker is a 44 year old left-handed black male with a history of degenerative arthritis affecting the left knee. The patient has been seen by orthopedic surgery, he received an injection in the knee in August or September 2016. The patient indicates that 2 weeks prior to the injection he began having some slight numbness and tingly sensations in the bottom of the left foot, but after the injections the sensations of numbness were significantly worsened. The patient denies any back pain or pain down the leg, he denies any weakness in the left leg, he denies any numbness on the right leg or foot or on the arms. He denies any neck pain. He has not had a change in balance or walking, he denies any changes in control the bowels or the bladder. He is sent to this office for further evaluation. The numbness is only in the bottom of the foot, it does not cross the ankle, it is not on the top of the foot.  Past Medical History  Diagnosis Date  . Hypertension   . Glaucoma   . Colon polyps   . Crohn disease ([place]) 1995  . Sleep apnea   . IBS (irritable bowel syndrome)   . Osteoarthritis   . IT band syndrome   . 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 Derangement of left knee   . Tear of medial meniscus of left knee   . Hamstring tendinitis   . IBS (irritable bowel syndrome)     Past Surgical History  Procedure Laterality Date  . Eye surgery Bilateral     part. detached retina  . Cataract extraction Left     lens inplant    Family History  Problem Relation Age of Onset  . Migraines Mother   . Colon cancer Neg Hx   . Prostate cancer Maternal Grandfather   . Prostate cancer Maternal Uncle   . Arthritis/Rheumatoid Maternal Grandmother   . Cancer Maternal Grandmother   . Arthritis/Rheumatoid Paternal Grandmother   .  Stroke Paternal Grandfather     Social history:  reports that he has never smoked. He has never used smokeless tobacco. He reports that he does not drink alcohol or use illicit drugs.  Medications:  Prior to Admission medications   Medication Sig Start Date End Date Taking? Authorizing Provider  amLODipine-olmesartan (AZOR) 5-20 MG tablet Take 1 tablet by mouth daily.   Yes Historical Provider, MD  AZOPT 1 % ophthalmic suspension Place 1 drop into both eyes 2 (two) times daily. 05/27/14  Yes Historical Provider, MD  bimatoprost (LUMIGAN) 0.01 % SOLN 1 drop at bedtime.   Yes Historical Provider, MD  brimonidine-timolol (COMBIGAN) 0.2-0.5 % ophthalmic solution Place 1 drop into both eyes at bedtime.    Yes Historical Provider, MD  meloxicam (MOBIC) 15 MG tablet Take 15 mg by mouth daily.   Yes Historical Provider, MD  ondansetron (ZOFRAN) 4 MG tablet Take 1 tablet (4 mg total) by mouth every 8 (eight) hours as needed for nausea or vomiting. 10/05/14  Yes Amy S Esterwood, PA-C  UNKNOWN TO PATIENT Pt states he was just started on a new HTN medication and is unable to recall the name of the medication.   Yes Historical Provider, MD     No Known Allergies  ROS:  Out of a complete  14 system review of symptoms, the patient complains only of the following symptoms, and all other reviewed systems are negative.  Snoring Numbness  Blood pressure 111/68, pulse 47, height 6' (1.829 m), weight 279 lb (126.554 kg).  Physical Exam  General: The patient is alert and cooperative at the time of the examination.  Eyes: Pupils are equal, round, and reactive to light. Discs are flat bilaterally.  Neck: The neck is supple, no carotid bruits are noted.  Respiratory: The respiratory examination is clear.  Cardiovascular: The cardiovascular examination reveals a regular rate and rhythm, no obvious murmurs or rubs are noted.  Skin: Extremities are without significant edema.  Neurologic Exam  Mental  status: The patient is alert and oriented x 3 at the time of the examination. The patient has apparent normal recent and remote memory, with an apparently normal attention span and concentration ability.  Cranial nerves: Facial symmetry is present. There is good sensation of the face to pinprick and soft touch bilaterally. The strength of the facial muscles and the muscles to head turning and shoulder shrug are normal bilaterally. Speech is well enunciated, no aphasia or dysarthria is noted. Extraocular movements are full. Visual fields are full. The tongue is midline, and the patient has symmetric elevation of the soft palate. No obvious hearing deficits are noted.  Motor: The motor testing reveals 5 over 5 strength of all 4 extremities. Good symmetric motor tone is noted throughout.  Sensory: Sensory testing is intact to pinprick, soft touch, vibration sensation, and position sense on all 4 extremities. No evidence of extinction is noted.  Coordination: Cerebellar testing reveals good finger-nose-finger and heel-to-shin bilaterally.  Gait and station: Gait is normal. Tandem gait is normal. Romberg is negative. No drift is seen. The patient is able to walk on heels and the toes bilaterally.  Reflexes: Deep tendon reflexes are symmetric and normal bilaterally. Toes are downgoing bilaterally.   Assessment/Plan:  1. Left foot numbness, plantar surface  The patient has numbness isolated to the plantar surface of the left foot. A tarsal tunnel syndrome does need to be considered. The patient will be set up for nerve conduction studies on both legs, EMG on the left leg. He will return for the above study.  Jill Alexanders MD 04/20/2015 6:28 PM  [facility] [address] [place], [place] [address]  Phone [phone] Fax [phone]

## 2015-04-20 NOTE — Patient Instructions (Addendum)
   We will get EMG and NCV study on the legs to look for a nerve problem causing the left foot numbness.   Paresthesia Paresthesia is an abnormal burning or prickling sensation. This sensation is generally felt in the hands, arms, legs, or feet. However, it may occur in any part of the body. Usually, it is not painful. The feeling may be described as:  Tingling or numbness.  Pins and needles.  Skin crawling.  Buzzing.  Limbs falling asleep.  Itching. Most people experience temporary (transient) paresthesia at some time in their lives. Paresthesia may occur when you breathe too quickly (hyperventilation). It can also occur without any apparent cause. Commonly, paresthesia occurs when pressure is placed on a nerve. The sensation quickly goes away after the pressure is removed. For some people, however, paresthesia is a long-lasting (chronic) condition that is caused by an underlying disorder. If you continue to have paresthesia, you may need further medical evaluation. HOME CARE INSTRUCTIONS Watch your condition for any changes. Taking the following actions may help to lessen any discomfort that you are feeling:  Avoid drinking alcohol.  Try acupuncture or massage to help relieve your symptoms.  Keep all follow-up visits as directed by your health care provider. This is important. SEEK MEDICAL CARE IF:  You continue to have episodes of paresthesia.  Your burning or prickling feeling gets worse when you walk.  You have pain, cramps, or dizziness.  You develop a rash. SEEK IMMEDIATE MEDICAL CARE IF:  You feel weak.  You have trouble walking or moving.  You have problems with speech, understanding, or vision.  You feel confused.  You cannot control your bladder or bowel movements.  You have numbness after an injury.  You faint.   This information is not intended to replace advice given to you by your health care provider. Make sure you discuss any questions you have  with your health care provider.   Document Released: 05/11/2002 Document Revised: 10/05/2014 Document Reviewed: 05/17/2014 Elsevier Interactive Patient Education [employer].

## 2015-06-14 ENCOUNTER — Encounter: Payer: Self-pay | Admitting: Neurology

## 2015-06-14 ENCOUNTER — Ambulatory Visit (INDEPENDENT_AMBULATORY_CARE_PROVIDER_SITE_OTHER): Payer: Self-pay | Admitting: Neurology

## 2015-06-14 ENCOUNTER — Ambulatory Visit (INDEPENDENT_AMBULATORY_CARE_PROVIDER_SITE_OTHER): Payer: 59 | Admitting: Neurology

## 2015-06-14 DIAGNOSIS — R202 Paresthesia of skin: Secondary | ICD-10-CM

## 2015-06-14 NOTE — Progress Notes (Signed)
Andre Decker is a 45 year old gentleman with a history of left foot numbness. The numbness is on the sole of the foot only, unassociated with pain or weakness or balance changes. He comes in for an evaluation with EMG and nerve conduction study.  The EMG and nerve conduction study was unremarkable, no evidence of a neuropathy, tarsal tunnel syndrome, or a lumbosacral radiculopathy was seen.  I have asked the patient to contact me if he believes that there is progression of symptoms or new symptoms developing. We will follow the patient conservatively otherwise.

## 2015-06-14 NOTE — Procedures (Signed)
     HISTORY:  Andre Decker is a 45 year old gentleman with a history of numbness unassociated with pain or weakness of the sole of the left foot. The patient denies any back pain or leg discomfort. He is being evaluated for possible neuropathy or a lumbosacral radiculopathy.  NERVE CONDUCTION STUDIES:  Nerve conduction studies were performed on both lower extremities. The distal motor latencies and motor amplitudes for the peroneal and posterior tibial nerves were within normal limits. The nerve conduction velocities for these nerves were also normal. The H reflex latencies were normal. The sensory latencies for the peroneal nerves were within normal limits. The medial and lateral plantar sensory latencies were symmetric and normal bilaterally.   EMG STUDIES:  EMG study was performed on the left lower extremity:  The tibialis anterior muscle reveals 2 to 4K motor units with full recruitment. No fibrillations or positive waves were seen. The peroneus tertius muscle reveals 2 to 4K motor units with full recruitment. No fibrillations or positive waves were seen. The medial gastrocnemius muscle reveals 1 to 3K motor units with full recruitment. No fibrillations or positive waves were seen. The vastus lateralis muscle reveals 2 to 4K motor units with full recruitment. No fibrillations or positive waves were seen. The iliopsoas muscle reveals 2 to 4K motor units with full recruitment. No fibrillations or positive waves were seen. The biceps femoris muscle (long head) reveals 2 to 4K motor units with full recruitment. No fibrillations or positive waves were seen. The lumbosacral paraspinal muscles were tested at 3 levels, and revealed no abnormalities of insertional activity at all 3 levels tested. There was good relaxation.   IMPRESSION:  Nerve conduction studies done on both lower extremities were within normal limits. No evidence of a peripheral neuropathy is seen. There is no evidence of  tarsal tunnel syndrome on either side. EMG evaluation of the left lower extremity was unremarkable, without evidence of an overlying lumbosacral radiculopathy.  Jill Alexanders MD 06/14/2015 10:48 AM  [facility] [address] [place], [place] [address]  Phone [phone] Fax [phone]

## 2015-06-14 NOTE — Progress Notes (Signed)
Please refer to EMG and nerve conduction study procedure note. 

## 2015-07-26 ENCOUNTER — Emergency Department (HOSPITAL_COMMUNITY)
Admission: EM | Admit: 2015-07-26 | Discharge: 2015-07-26 | Disposition: A | Discharge status: Home / Self Care | Payer: 59 | Source: Home / Self Care | Attending: Family Medicine | Admitting: Family Medicine

## 2015-07-26 ENCOUNTER — Encounter (HOSPITAL_COMMUNITY): Payer: Self-pay | Admitting: *Deleted

## 2015-07-26 DIAGNOSIS — R69 Illness, unspecified: Principal | ICD-10-CM

## 2015-07-26 DIAGNOSIS — J111 Influenza due to unidentified influenza virus with other respiratory manifestations: Secondary | ICD-10-CM | POA: Diagnosis not present

## 2015-07-26 MED ORDER — OSELTAMIVIR PHOSPHATE 75 MG PO CAPS: 75.0000 mg | ORAL_CAPSULE | Freq: Two times a day (BID) | ORAL | Status: DC

## 2015-07-26 MED ORDER — ACETAMINOPHEN 325 MG PO TABS
650.0000 mg | ORAL_TABLET | Freq: Once | ORAL | Status: AC
  Administered 2015-07-26: 650 mg via ORAL

## 2015-07-26 MED ORDER — ONDANSETRON HCL 4 MG PO TABS: 4.0000 mg | ORAL_TABLET | Freq: Four times a day (QID) | ORAL | Status: DC

## 2015-07-26 MED ORDER — ACETAMINOPHEN 325 MG PO TABS
ORAL_TABLET | ORAL | Status: AC
  Filled 2015-07-26: qty 2

## 2015-07-26 NOTE — Discharge Instructions (Signed)
Drink plenty of fluids as discussed, use medicine as prescribed, and mucinex or delsym for cough. Return or see your doctor if further problems °

## 2015-07-26 NOTE — ED Provider Notes (Signed)
CSN: [id number]     Arrival date & time 07/26/15  1819 History   First MD Initiated Contact with Patient 07/26/15 2017     Chief Complaint  Patient presents with  . Fever   (Consider location/radiation/quality/duration/timing/severity/associated sxs/prior Treatment) Patient is a 45 y.o. male presenting with fever. The history is provided by the patient and the spouse.  Fever Temp source:  Oral Severity:  Moderate Onset quality:  Sudden Duration:  12 hours Progression:  Unchanged Chronicity:  New Relieved by:  None tried Worsened by:  Nothing tried Ineffective treatments:  None tried Associated symptoms: chills, congestion, cough, myalgias and nausea   Associated symptoms: no diarrhea, no rash and no vomiting     Past Medical History  Diagnosis Date  . Hypertension   . Glaucoma   . Colon polyps   . Crohn disease ([place]) 1995  . Sleep apnea   . IBS (irritable bowel syndrome)   . Osteoarthritis   . IT band syndrome   . 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 Derangement of left knee   . Tear of medial meniscus of left knee   . Hamstring tendinitis   . IBS (irritable bowel syndrome)    Past Surgical History  Procedure Laterality Date  . Eye surgery Bilateral     part. detached retina  . Cataract extraction Left     lens inplant   Family History  Problem Relation Age of Onset  . Migraines Mother   . Colon cancer Neg Hx   . Prostate cancer Maternal Grandfather   . Prostate cancer Maternal Uncle   . Arthritis/Rheumatoid Maternal Grandmother   . Cancer Maternal Grandmother   . Arthritis/Rheumatoid Paternal Grandmother   . Stroke Paternal Grandfather    Social History  Substance Use Topics  . Smoking status: Never Smoker   . Smokeless tobacco: Never Used  . Alcohol Use: No    Review of Systems  Constitutional: Positive for fever, chills, activity change and appetite change.  HENT: Positive for congestion.   Respiratory: Positive for  cough.   Cardiovascular: Negative.   Gastrointestinal: Positive for nausea. Negative for vomiting and diarrhea.  Genitourinary: Negative.   Musculoskeletal: Positive for myalgias.  Skin: Negative.  Negative for rash.  All other systems reviewed and are negative.   Allergies  Review of patient's allergies indicates no known allergies.  Home Medications   Prior to Admission medications   Medication Sig Start Date End Date Taking? Authorizing Provider  amLODipine-olmesartan (AZOR) 5-20 MG tablet Take 1 tablet by mouth daily.    Historical Provider, MD  AZOPT 1 % ophthalmic suspension Place 1 drop into both eyes 2 (two) times daily. 05/27/14   Historical Provider, MD  bimatoprost (LUMIGAN) 0.01 % SOLN 1 drop at bedtime.    Historical Provider, MD  brimonidine-timolol (COMBIGAN) 0.2-0.5 % ophthalmic solution Place 1 drop into both eyes at bedtime.     Historical Provider, MD  meloxicam (MOBIC) 15 MG tablet Take 15 mg by mouth daily.    Historical Provider, MD  ondansetron (ZOFRAN) 4 MG tablet Take 1 tablet (4 mg total) by mouth every 8 (eight) hours as needed for nausea or vomiting. 10/05/14   Amy S Esterwood, PA-C  UNKNOWN TO PATIENT Pt states he was just started on a new HTN medication and is unable to recall the name of the medication.    Historical Provider, MD   Meds Ordered and Administered this Visit  Medications - No  data to display  BP 158/108 mmHg  Pulse 92  Temp(Src) 101.2 F (38.4 C) (Oral)  Resp 20  SpO2 97% No data found.   Physical Exam  Constitutional: He is oriented to person, place, and time. He appears well-developed and well-nourished. He appears distressed.  HENT:  Right Ear: External ear normal.  Left Ear: External ear normal.  Mouth/Throat: Oropharynx is clear and moist. No oropharyngeal exudate.  Neck: Normal range of motion. Neck supple.  Cardiovascular: Normal rate, normal heart sounds and intact distal pulses.   Pulmonary/Chest: Effort normal and breath  sounds normal.  Lymphadenopathy:    He has no cervical adenopathy.  Neurological: He is alert and oriented to person, place, and time.  Skin: Skin is warm and dry.  Nursing note and vitals reviewed.   ED Course  Procedures (including critical care time)  Labs Review Labs Reviewed - No data to display  Imaging Review No results found.   Visual Acuity Review  Right Eye Distance:   Left Eye Distance:   Bilateral Distance:    Right Eye Near:   Left Eye Near:    Bilateral Near:         MDM  No diagnosis found.  Meds ordered this encounter  Medications  . acetaminophen (TYLENOL) tablet 650 mg    Sig:   . ondansetron (ZOFRAN) 4 MG tablet    Sig: Take 1 tablet (4 mg total) by mouth every 6 (six) hours. Prn n/v    Dispense:  8 tablet    Refill:  0  . oseltamivir (TAMIFLU) 75 MG capsule    Sig: Take 1 capsule (75 mg total) by mouth every 12 (twelve) hours. Take all of medication.    Dispense:  10 capsule    Refill:  0      Billy Fischer, MD 07/26/15 2037

## 2015-07-26 NOTE — ED Notes (Signed)
Pt  Has  Symptoms  Of  Body  Aches   Fever  Chills   Stomach    Pain  With  Onset  Of  Symptoms  Today      Pt  Not  Taking his  bp  meds       No  Flu  Shot      This  Year

## 2015-08-01 ENCOUNTER — Encounter: Payer: Self-pay | Admitting: *Deleted

## 2015-08-09 ENCOUNTER — Encounter: Payer: Self-pay | Admitting: Internal Medicine

## 2015-08-09 ENCOUNTER — Ambulatory Visit (INDEPENDENT_AMBULATORY_CARE_PROVIDER_SITE_OTHER): Payer: 59 | Admitting: Internal Medicine

## 2015-08-09 VITALS — BP 120/88 | HR 65 | Ht 73.0 in | Wt 274.0 lb

## 2015-08-09 DIAGNOSIS — R109 Unspecified abdominal pain: Secondary | ICD-10-CM

## 2015-08-09 DIAGNOSIS — K589 Irritable bowel syndrome without diarrhea: Secondary | ICD-10-CM | POA: Diagnosis not present

## 2015-08-09 MED ORDER — GLYCOPYRROLATE 2 MG PO TABS: 2.0000 mg | ORAL_TABLET | Freq: Two times a day (BID) | ORAL | Status: DC | PRN

## 2015-08-09 NOTE — Progress Notes (Signed)
Subjective:    Patient ID: Andre Decker, male    DOB: [birth year], 45 y.o.   MRN: [id number]  HPI Andre Decker is a 45 year old male previously managed by Dr. Sharlett Iles with a history of IBS who is here for follow-up. He was last seen in May 2016 by Nicoletta Ba, PA-C. He has a history of recurrent mid abdominal pain which has been worked up extensively in the past. After workup it was felt that he was likely having doubts of irritable bowel related pain with spasm. He has had upper endoscopy and colonoscopy both in 2014 which were normal. He had prior CT scan of the abdomen and pelvis which was unremarkable except for hepatic hemangioma. Abdominal ultrasound unremarkable, CCK HIDA scan normal. C1 esterase normal.  Recently he reports he has been having episodic mid abdominal pain. Pain starts as a dull ache and can progress to more cramping type pain. Can last several hours. At times he has to lay down and when he wakes up the pain is gone. Not associated with nausea or vomiting. Occurring 1-2 times per month. Does not relate to eating or bowel movement. Denies heartburn. Denies trouble swallowing. Denies weight loss or early satiety. No change in bowel habit including no diarrhea or constipation. Denies blood in his stool or melena.  Family history notable for pancreatic cancer in an uncle and he feels that his paternal grandfather had colon cancer at advanced age. He denies colorectal cancer in first degree relative   Review of Systems  As per HPI, otherwise negative.  Current Medications, Allergies, Past Medical History, Past Surgical History, Family History and Social History were reviewed in [employer] record.  OBJECTIVE:  BP 120/88 mmHg  Pulse 65  Ht 6\' 1"  (1.854 m)  Wt 274 lb (124.286 kg)  BMI 36.16 kg/m2 Constitutional: Well-developed and well-nourished. No distress. HEENT: Normocephalic and atraumatic.  Conjunctivae are normal.  No scleral  icterus. Neck: Neck supple. Trachea midline. Cardiovascular: Normal rate, regular rhythm and intact distal pulses. No M/R/G Pulmonary/chest: Effort normal and breath sounds normal. No wheezing, rales or rhonchi. Abdominal: Soft, nontender, nondistended. Bowel sounds active throughout. There are no masses palpable. Obese. Extremities: no clubbing, cyanosis, or edema Neurological: Alert and oriented to person place and time. Skin: Skin is warm and dry Psychiatric: Normal mood and affect. Behavior is normal.  CBC    Component Value Date/Time   WBC 6.5 06/17/2014 1040   RBC 4.78 06/17/2014 1040   HGB 13.3 06/17/2014 1040   HCT 38.4* 06/17/2014 1040   PLT 224 06/17/2014 1040   MCV 80.3 06/17/2014 1040   MCH 27.8 06/17/2014 1040   MCHC 34.6 06/17/2014 1040   RDW 12.8 06/17/2014 1040   LYMPHSABS 1.8 06/17/2014 1040   MONOABS 0.6 06/17/2014 1040   EOSABS 0.0 06/17/2014 1040   BASOSABS 0.0 06/17/2014 1040    CMP     Component Value Date/Time   NA 141 06/17/2014 0904   K 3.9 06/17/2014 0904   CL 107 06/17/2014 0904   CO2 23 06/17/2014 0904   GLUCOSE 98 06/17/2014 0904   BUN 14 06/17/2014 0904   CREATININE 1.43* 06/17/2014 0904   CALCIUM 9.8 06/17/2014 0904   PROT 7.1 10/05/2014 0935   ALBUMIN 4.1 10/05/2014 0935   AST 35 10/05/2014 0935   ALT 42 10/05/2014 0935   ALKPHOS 37* 10/05/2014 0935   BILITOT 1.0 10/05/2014 0935   GFRNONAA 59* 06/17/2014 0904   GFRAA 69* 06/17/2014 0904  Lipase     Component Value Date/Time   LIPASE 15.0 07/06/2013 1511    Prior CT abd/pelvis, [place], HIDA scan reviewed EGD/colon reviewed including with pt      Physical Exam See above    Assessment & Plan:  45 year old male previously managed by Dr. Sharlett Iles with a history of IBS who is here for follow-up.   1. IBS/episodic mid abdominal pain -- no other etiology found for episodic mid abdominal spastic type pain despite thorough workup over the last several years. Symptoms have not been  progressive. PPI previous without much benefit. No GERD symptoms now. I will try him on IBgard 2 capsules 3 times daily at the first sign of any mid abdominal pain. If this is not helpful prescription provided for Robinul Forte 2 mg twice a day as needed. Tramadol or Vicodin was offered should pain become severe and he declines at this time. No evidence for IBD. He is asked to let us know symptoms change or worsen in any way. He voices understanding  2. CRC screening -- average risk, possible 1 second degree relative with colon cancer at late age. I explained current screening guidelines which support repeat screening colonoscopy in 2024.  He will follow-up with primary care in the on an as-needed basis 25 minutes spent with the patient today. Greater than 50% was spent in counseling and coordination of care with the patient

## 2015-08-09 NOTE — Patient Instructions (Signed)
Please purchase IB guard over the counter. Take 2 capsules three times daily as needed.  We have sent the following medications to your pharmacy for you to pick up at your convenience: Robinul Forte (use if IB Guard is ineffective)  Follow up with Dr Hilarie Fredrickson as needed.

## 2015-08-24 ENCOUNTER — Encounter (HOSPITAL_COMMUNITY): Payer: Self-pay | Admitting: Emergency Medicine

## 2015-08-24 ENCOUNTER — Emergency Department (HOSPITAL_COMMUNITY): Payer: 59

## 2015-08-24 ENCOUNTER — Emergency Department (HOSPITAL_COMMUNITY)
Admission: EM | Admit: 2015-08-24 | Discharge: 2015-08-24 | Disposition: A | Discharge status: Home / Self Care | Payer: 59 | Attending: Emergency Medicine | Admitting: Emergency Medicine

## 2015-08-24 DIAGNOSIS — K580 Irritable bowel syndrome with diarrhea: Secondary | ICD-10-CM | POA: Diagnosis not present

## 2015-08-24 DIAGNOSIS — Z8739 Personal history of other diseases of the musculoskeletal system and connective tissue: Secondary | ICD-10-CM | POA: Insufficient documentation

## 2015-08-24 DIAGNOSIS — I10 Essential (primary) hypertension: Secondary | ICD-10-CM | POA: Insufficient documentation

## 2015-08-24 DIAGNOSIS — Z86010 Personal history of colonic polyps: Secondary | ICD-10-CM | POA: Diagnosis not present

## 2015-08-24 DIAGNOSIS — Z87828 Personal history of other (healed) physical injury and trauma: Secondary | ICD-10-CM | POA: Diagnosis not present

## 2015-08-24 DIAGNOSIS — Z8669 Personal history of other diseases of the nervous system and sense organs: Secondary | ICD-10-CM | POA: Insufficient documentation

## 2015-08-24 DIAGNOSIS — Z79899 Other long term (current) drug therapy: Secondary | ICD-10-CM | POA: Insufficient documentation

## 2015-08-24 DIAGNOSIS — H409 Unspecified glaucoma: Secondary | ICD-10-CM | POA: Insufficient documentation

## 2015-08-24 DIAGNOSIS — R1013 Epigastric pain: Secondary | ICD-10-CM | POA: Diagnosis present

## 2015-08-24 LAB — COMPREHENSIVE METABOLIC PANEL
ALBUMIN: 4.2 g/dL (ref 3.5–5.0)
ALT: 37 U/L (ref 17–63)
AST: 29 U/L (ref 15–41)
Alkaline Phosphatase: 41 U/L (ref 38–126)
Anion gap: 11 (ref 5–15)
BUN: 18 mg/dL (ref 6–20)
CHLORIDE: 108 mmol/L (ref 101–111)
CO2: 26 mmol/L (ref 22–32)
CREATININE: 1.18 mg/dL (ref 0.61–1.24)
Calcium: 9.2 mg/dL (ref 8.9–10.3)
GFR calc Af Amer: >60 mL/min (ref >60)
GFR calc non Af Amer: >60 mL/min (ref >60)
GLUCOSE: 118 mg/dL — AB (ref 65–99)
POTASSIUM: 4 mmol/L (ref 3.5–5.1)
SODIUM: 145 mmol/L (ref 135–145)
Total Bilirubin: 1.8 mg/dL — ABNORMAL HIGH (ref 0.3–1.2)
Total Protein: 7.3 g/dL (ref 6.5–8.1)

## 2015-08-24 LAB — URINALYSIS, ROUTINE W REFLEX MICROSCOPIC
Bilirubin Urine: NEGATIVE
GLUCOSE, UA: NEGATIVE mg/dL
Hgb urine dipstick: NEGATIVE
KETONES UR: NEGATIVE mg/dL
LEUKOCYTES UA: NEGATIVE
NITRITE: NEGATIVE
PROTEIN: NEGATIVE mg/dL
Specific Gravity, Urine: 1.014 (ref 1.005–1.030)
pH: 5 (ref 5.0–8.0)

## 2015-08-24 LAB — CBC
HCT: 44.7 % (ref 39.0–52.0)
Hemoglobin: 14.9 g/dL (ref 13.0–17.0)
MCH: 26.8 pg (ref 26.0–34.0)
MCHC: 33.3 g/dL (ref 30.0–36.0)
MCV: 80.5 fL (ref 78.0–100.0)
PLATELETS: 222 10*3/uL (ref 150–400)
RBC: 5.55 MIL/uL (ref 4.22–5.81)
RDW: 13.2 % (ref 11.5–15.5)
WBC: 6.5 10*3/uL (ref 4.0–10.5)

## 2015-08-24 LAB — LIPASE, BLOOD: LIPASE: 23 U/L (ref 11–51)

## 2015-08-24 MED ORDER — IOHEXOL 300 MG/ML  SOLN
25.0000 mL | Freq: Once | INTRAMUSCULAR | Status: AC | PRN
  Administered 2015-08-24: 25 mL via ORAL

## 2015-08-24 MED ORDER — DICYCLOMINE HCL 20 MG PO TABS: 20.0000 mg | ORAL_TABLET | Freq: Two times a day (BID) | ORAL | Status: DC

## 2015-08-24 MED ORDER — ONDANSETRON HCL 4 MG/2ML IJ SOLN
4.0000 mg | Freq: Once | INTRAMUSCULAR | Status: AC
  Administered 2015-08-24: 4 mg via INTRAVENOUS
  Filled 2015-08-24: qty 2

## 2015-08-24 MED ORDER — IOPAMIDOL (ISOVUE-300) INJECTION 61%
100.0000 mL | Freq: Once | INTRAVENOUS | Status: AC | PRN
  Administered 2015-08-24: 100 mL via INTRAVENOUS

## 2015-08-24 MED ORDER — HYDROMORPHONE HCL 1 MG/ML IJ SOLN
1.0000 mg | Freq: Once | INTRAMUSCULAR | Status: AC
  Administered 2015-08-24: 1 mg via INTRAVENOUS
  Filled 2015-08-24: qty 1

## 2015-08-24 MED ORDER — GI COCKTAIL ~~LOC~~
30.0000 mL | Freq: Once | ORAL | Status: AC
  Administered 2015-08-24: 30 mL via ORAL
  Filled 2015-08-24: qty 30

## 2015-08-24 MED ORDER — MORPHINE SULFATE (PF) 4 MG/ML IV SOLN
4.0000 mg | Freq: Once | INTRAVENOUS | Status: AC
  Administered 2015-08-24: 4 mg via INTRAVENOUS
  Filled 2015-08-24: qty 1

## 2015-08-24 MED ORDER — SODIUM CHLORIDE 0.9 % IV BOLUS (SEPSIS)
1000.0000 mL | Freq: Once | INTRAVENOUS | Status: AC
  Administered 2015-08-24: 1000 mL via INTRAVENOUS

## 2015-08-24 NOTE — ED Notes (Signed)
UNABLE TO DO RECOLLECT ON PATIENT DUE TO HE STATED TO ME THAT I ONLY GET ONE STICK

## 2015-08-24 NOTE — ED Notes (Signed)
Unsuccessful blood draw attempt by this nurse

## 2015-08-24 NOTE — Discharge Instructions (Signed)
Irritable Bowel Syndrome, Adult Irritable bowel syndrome (IBS) is not one specific disease. It is a group of symptoms that affects the organs responsible for digestion (gastrointestinal or GI tract).  To regulate how your GI tract works, your body sends signals back and forth between your intestines and your brain. If you have IBS, there may be a problem with these signals. As a result, your GI tract does not function normally. Your intestines may become more sensitive and overreact to certain things. This is especially true when you eat certain foods or when you are under stress.  There are four types of IBS. These may be determined based on the consistency of your stool:   IBS with diarrhea.   IBS with constipation.   Mixed IBS.   Unsubtyped IBS.  It is important to know which type of IBS you have. Some treatments are more likely to be helpful for certain types of IBS.  CAUSES  The exact cause of IBS is not known. RISK FACTORS You may have a higher risk of IBS if:  You are a woman.  You are younger than 45 years old.  You have a family history of IBS.  You have mental health problems.  You have had bacterial infection of your GI tract. SIGNS AND SYMPTOMS  Symptoms of IBS vary from person to person. The main symptom is abdominal pain or discomfort. Additional symptoms usually include one or more of the following:   Diarrhea, constipation, or both.   Abdominal swelling or bloating.   Feeling full or sick after eating a small or regular-size meal.   Frequent gas.   Mucus in the stool.   A feeling of having more stool left after a bowel movement.  Symptoms tend to come and go. They may be associated with stress, psychiatric conditions, or nothing at all.  DIAGNOSIS  There is no specific test to diagnose IBS. Your health care provider will make a diagnosis based on a physical exam, medical history, and your symptoms. You may have other tests to rule out other  conditions that may be causing your symptoms. These may include:   Blood tests.   X-rays.   CT scan.  Endoscopy and colonoscopy. This is a test in which your GI tract is viewed with a long, thin, flexible tube. TREATMENT There is no cure for IBS, but treatment can help relieve symptoms. IBS treatment often includes:   Changes to your diet, such as:  Eating more fiber.  Avoiding foods that cause symptoms.  Drinking more water.  Eating regular, medium-sized portioned meals.  Medicines. These may include:  Fiber supplements if you have constipation.  Medicine to control diarrhea (antidiarrheal medicines).  Medicine to help control muscle spasms in your GI tract (antispasmodic medicines).  Medicines to help with any mental health issues, such as antidepressants or tranquilizers.  Therapy.  Talk therapy may help with anxiety, depression, or other mental health issues that can make IBS symptoms worse.  Stress reduction.  Managing your stress can help keep symptoms under control. HOME CARE INSTRUCTIONS   Take medicines only as directed by your health care provider.  Eat a healthy diet.  Avoid foods and drinks with added sugar.  Include more whole grains, fruits, and vegetables gradually into your diet. This may be especially helpful if you have IBS with constipation.  Avoid any foods and drinks that make your symptoms worse. These may include dairy products and caffeinated or carbonated drinks.  Do not eat large meals.    Drink enough fluid to keep your urine clear or pale yellow.  Exercise regularly. Ask your health care provider for recommendations of good activities for you.  Keep all follow-up visits as directed by your health care provider. This is important. SEEK MEDICAL CARE IF:   You have constant pain.  You have trouble or pain with swallowing.  You have worsening diarrhea. SEEK IMMEDIATE MEDICAL CARE IF:   You have severe and worsening abdominal  pain.   You have diarrhea and:   You have a rash, stiff neck, or severe headache.   You are irritable, sleepy, or difficult to awaken.   You are weak, dizzy, or extremely thirsty.   You have bright red blood in your stool or you have black tarry stools.   You have unusual abdominal swelling that is painful.   You vomit continuously.   You vomit blood (hematemesis).   You have both abdominal pain and a fever.    This information is not intended to replace advice given to you by your health care provider. Make sure you discuss any questions you have with your health care provider.   Document Released: 05/21/2005 Document Revised: 06/11/2014 Document Reviewed: 02/05/2014 Elsevier Interactive Patient Education 2016 [employer]  

## 2015-08-24 NOTE — ED Notes (Signed)
Per EMS-states he was at work and started having abdominal pain-history of Chron's and IBS

## 2015-08-24 NOTE — ED Notes (Signed)
Pt in CT.

## 2015-08-24 NOTE — ED Notes (Signed)
Pt given specimen cup and made aware of need for UA

## 2015-08-24 NOTE — ED Provider Notes (Signed)
CSN: [id number]     Arrival date & time 08/24/15  1317 History   First MD Initiated Contact with Patient 08/24/15 1654     Chief Complaint  Patient presents with  . Abdominal Pain     (Consider location/radiation/quality/duration/timing/severity/associated sxs/prior Treatment) Patient is a 45 y.o. male presenting with abdominal pain. The history is provided by the patient.  Abdominal Pain Pain location:  Epigastric Pain quality: sharp and shooting   Pain radiates to:  Does not radiate Pain severity:  Moderate Onset quality:  Sudden Duration:  2 days Timing:  Constant Progression:  Worsening Chronicity:  Recurrent Relieved by:  Nothing Worsened by:  Nothing tried Ineffective treatments:  None tried Associated symptoms: chills, fever, nausea and vomiting   Associated symptoms: no chest pain, no diarrhea and no shortness of breath    45 yo M With a chief complaint of epigastric abdominal pain. This is a chronic issue for this patient. He has irritable bowel syndrome. Recently saw Dr. Hilarie Fredrickson about 2 weeks ago. Was started on some new medications. Patient has not taken them until yesterday. Has taken multiple doses and he feels like is not improved his pain. Has had some subjective fevers and chills. Pain feels like his normal IBS pain. He thinks it is more severe than normal. Has had some loose stools and vomiting this.  Past Medical History  Diagnosis Date  . Hypertension   . Glaucoma   . Colon polyps   . Sleep apnea   . IBS (irritable bowel syndrome)   . Osteoarthritis   . IT band syndrome   . 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 Derangement of left knee   . Tear of medial meniscus of left knee   . Hamstring tendinitis   . IBS (irritable bowel syndrome)   . Duodenitis    Past Surgical History  Procedure Laterality Date  . Eye surgery Bilateral     part. detached retina  . Cataract extraction Left     lens inplant   Family History   Problem Relation Age of Onset  . Migraines Mother   . Colon cancer Neg Hx   . Prostate cancer Maternal Grandfather   . Prostate cancer Maternal Uncle   . Arthritis/Rheumatoid Maternal Grandmother   . Cancer Maternal Grandmother   . Arthritis/Rheumatoid Paternal Grandmother   . Stroke Paternal Grandfather    Social History  Substance Use Topics  . Smoking status: Never Smoker   . Smokeless tobacco: Never Used  . Alcohol Use: No    Review of Systems  Constitutional: Positive for fever and chills.  HENT: Negative for congestion and facial swelling.   Eyes: Negative for discharge and visual disturbance.  Respiratory: Negative for shortness of breath.   Cardiovascular: Negative for chest pain and palpitations.  Gastrointestinal: Positive for nausea and vomiting. Negative for abdominal pain and diarrhea.  Musculoskeletal: Negative for myalgias and arthralgias.  Skin: Negative for color change and rash.  Neurological: Negative for tremors, syncope and headaches.  Psychiatric/Behavioral: Negative for confusion and dysphoric mood.      Allergies  Review of patient's allergies indicates no known allergies.  Home Medications   Prior to Admission medications   Medication Sig Start Date End Date Taking? Authorizing Provider  amLODipine (NORVASC) 5 MG tablet Take 5 mg by mouth daily. 07/27/15  Yes Historical Provider, MD  AZOPT 1 % ophthalmic suspension Place 1 drop into both eyes 2 (two) times daily. 05/27/14  Yes  Historical Provider, MD  bimatoprost (LUMIGAN) 0.01 % SOLN 1 drop at bedtime.   Yes Historical Provider, MD  brimonidine-timolol (COMBIGAN) 0.2-0.5 % ophthalmic solution Place 1 drop into both eyes 2 (two) times daily.    Yes Historical Provider, MD  glycopyrrolate (ROBINUL) 2 MG tablet Take 1 tablet (2 mg total) by mouth 2 (two) times daily as needed. Patient taking differently: Take 2 mg by mouth 2 (two) times daily as needed (IBS).  08/09/15  Yes Jerene Bears, MD   hydrochlorothiazide (HYDRODIURIL) 12.5 MG tablet Take 12.5 mg by mouth daily. 07/27/15  Yes Historical Provider, MD  valsartan (DIOVAN) 160 MG tablet Take 160 mg by mouth daily. 07/27/15  Yes Historical Provider, MD  dicyclomine (BENTYL) 20 MG tablet Take 1 tablet (20 mg total) by mouth 2 (two) times daily. 08/24/15   Deno Etienne, DO   BP 104/59 mmHg  Pulse 75  Temp(Src) 98.1 F (36.7 C) (Oral)  Resp 20  SpO2 96% Physical Exam  Constitutional: He is oriented to person, place, and time. He appears well-developed and well-nourished.  HENT:  Head: Normocephalic and atraumatic.  Eyes: EOM are normal. Pupils are equal, round, and reactive to light.  Neck: Normal range of motion. Neck supple. No JVD present.  Cardiovascular: Normal rate and regular rhythm.  Exam reveals no gallop and no friction rub.   No murmur heard. Pulmonary/Chest: No respiratory distress. He has no wheezes.  Abdominal: He exhibits no distension. There is tenderness (diffusely tender, worst to the epigastrium). There is no rebound and no guarding.  Musculoskeletal: Normal range of motion.  Neurological: He is alert and oriented to person, place, and time.  Skin: No rash noted. No pallor.  Psychiatric: He has a normal mood and affect. His behavior is normal.  Nursing note and vitals reviewed.   ED Course  Procedures (including critical care time) Labs Review Labs Reviewed  COMPREHENSIVE METABOLIC PANEL - Abnormal; Notable for the following:    Glucose, Bld 118 (*)    Total Bilirubin 1.8 (*)    All other components within normal limits  CBC  URINALYSIS, ROUTINE W REFLEX MICROSCOPIC (NOT AT [facility])  LIPASE, BLOOD  I-STAT CHEM 8, ED    Imaging Review Ct Abdomen Pelvis W Contrast  08/24/2015  CLINICAL DATA:  45 year old male with abdominal pain. History of Crohn's disease. 35 EXAM: CT ABDOMEN AND PELVIS WITH CONTRAST TECHNIQUE: Multidetector CT imaging of the abdomen and pelvis was performed using the standard  protocol following bolus administration of intravenous contrast. CONTRAST:  34mL OMNIPAQUE IOHEXOL 300 MG/ML SOLN, 168mL ISOVUE-300 IOPAMIDOL (ISOVUE-300) INJECTION 61% COMPARISON:  CT dated 07/06/2013 FINDINGS: The visualized lung bases are clear. No intra-abdominal free air or free fluid. Diffuse hepatic steatosis. A stable appearing 3.4 x 2.6 cm focal area of hypodensity the right lobe of the liver is not well characterized on this study but previously described as a hemangioma. Bold the gallbladder, pancreas, spleen, adrenal glands, kidneys, visualized ureters, and urinary bladder appear unremarkable. The prostate and seminal vesicles are grossly unremarkable. Oral contrast noted in the distal small bowel and traverses into the colon without evidence of bowel obstruction. There is no active inflammatory changes of the bowel. The appendix appears unremarkable. The abdominal aorta and IVC appear unremarkable. No portal venous gas identified. There is no adenopathy. Bold small fat containing umbilical hernia. Minimal degenerative changes of the spine. No acute osseous pathology. IMPRESSION: No acute intra-abdominal pelvic pathology. Electronically Signed   By: Laren Everts.D.  On: 08/24/2015 21:34   I have personally reviewed and evaluated these images and lab results as part of my medical decision-making.   EKG Interpretation None      MDM   Final diagnoses:  Irritable bowel syndrome with diarrhea    45 yo M with a chief complaint of abdominal pain. Patient having rigors while I was in the room. Complaining of pain all over his abdomen. Exam was significant tenderness. Will obtain a CT scan.  CT negative for acute pathology, gi follow up.  :  I have discussed the diagnosis/risks/treatment options with the patient and family and believe the pt to be eligible for discharge home to follow-up with GI. We also discussed returning to the ED immediately if new or worsening sx occur. We discussed  the sx which are most concerning (e.g., sudden worsening pain, fever, inability to tolerate by mouth) that necessitate immediate return. Medications administered to the patient during their visit and any new prescriptions provided to the patient are listed below.  Medications given during this visit Medications  morphine 4 MG/ML injection 4 mg (4 mg Intravenous Given 08/24/15 1744)  ondansetron (ZOFRAN) injection 4 mg (4 mg Intravenous Given 08/24/15 1744)  sodium chloride 0.9 % bolus 1,000 mL (0 mLs Intravenous Stopped 08/24/15 1950)  gi cocktail (Maalox,Lidocaine,Donnatal) (30 mLs Oral Given 08/24/15 1748)  HYDROmorphone (DILAUDID) injection 1 mg (1 mg Intravenous Given 08/24/15 2043)  ondansetron (ZOFRAN) injection 4 mg (4 mg Intravenous Given 08/24/15 2042)  iohexol (OMNIPAQUE) 300 MG/ML solution 25 mL (25 mLs Oral Contrast Given 08/24/15 1715)  iopamidol (ISOVUE-300) 61 % injection 100 mL (100 mLs Intravenous Contrast Given 08/24/15 2115)    Discharge Medication List as of 08/24/2015 10:06 PM    START taking these medications   Details  dicyclomine (BENTYL) 20 MG tablet Take 1 tablet (20 mg total) by mouth 2 (two) times daily., Starting 08/24/2015, Until Discontinued, Print        The patient appears reasonably screen and/or stabilized for discharge and I doubt any other medical condition or other [facility] requiring further screening, evaluation, or treatment in the ED at this time prior to discharge.    Deno Etienne, DO 08/25/15 [phone]

## 2015-08-26 ENCOUNTER — Ambulatory Visit: Payer: Self-pay | Admitting: Gastroenterology

## 2015-11-02 ENCOUNTER — Ambulatory Visit (INDEPENDENT_AMBULATORY_CARE_PROVIDER_SITE_OTHER): Payer: 59 | Admitting: Internal Medicine

## 2015-11-02 ENCOUNTER — Encounter: Payer: Self-pay | Admitting: Internal Medicine

## 2015-11-02 ENCOUNTER — Other Ambulatory Visit (INDEPENDENT_AMBULATORY_CARE_PROVIDER_SITE_OTHER): Payer: 59

## 2015-11-02 VITALS — BP 126/84 | HR 60 | Ht 72.0 in | Wt 267.2 lb

## 2015-11-02 DIAGNOSIS — R17 Unspecified jaundice: Secondary | ICD-10-CM

## 2015-11-02 DIAGNOSIS — R109 Unspecified abdominal pain: Secondary | ICD-10-CM

## 2015-11-02 LAB — COMPREHENSIVE METABOLIC PANEL
ALBUMIN: 4.3 g/dL (ref 3.5–5.2)
ALK PHOS: 42 U/L (ref 39–117)
ALT: 61 U/L — ABNORMAL HIGH (ref 0–53)
AST: 109 U/L — ABNORMAL HIGH (ref 0–37)
BUN: 10 mg/dL (ref 6–23)
CALCIUM: 9.6 mg/dL (ref 8.4–10.5)
CHLORIDE: 107 meq/L (ref 96–112)
CO2: 29 mEq/L (ref 19–32)
Creatinine, Ser: 1.16 mg/dL (ref 0.40–1.50)
GFR: 88.12 mL/min (ref >60.00)
Glucose, Bld: 100 mg/dL — ABNORMAL HIGH (ref 70–99)
POTASSIUM: 4 meq/L (ref 3.5–5.1)
Sodium: 141 mEq/L (ref 135–145)
TOTAL PROTEIN: 6.9 g/dL (ref 6.0–8.3)
Total Bilirubin: 1.3 mg/dL — ABNORMAL HIGH (ref 0.2–1.2)

## 2015-11-02 NOTE — Progress Notes (Signed)
Subjective:    Patient ID: Andre Decker, male    DOB: Oct 11, 1970, 45 y.o.   MRN: [id number]  HPI Andre Decker is a 45 year old male with a history of IBS who is here for follow-up. He was last seen on 08/09/2015. He's been experiencing intermittent recurrent midabdominal pain which is been extensively worked up in the past as discussed at our last office visit. He had prior upper endoscopy and colonoscopy in 2014 which were normal. Prior cross-sectional imaging as well as CCK HIDA scan and abdominal ultrasound have been unremarkable. We recommended IBgard and Robunil forte after his last visit. He has tried these medications.  He had an attack of severe mid abdominal pain which occurred fairly abruptly on 08/24/2015. This felt like a tight knotting pain in the middle of the abdomen. Did not get better he called EMS and was taken to the hospital. CT scan was performed which was again unremarkable. He's had one minor attack of similar pain since then and he normally takes Robinul Forte and lies down. The fetal position seems to help with the pain. It does not associate with nausea or vomiting. When the episode and see normally feels well. He denies change in bowel habit, blood in his stool or melena. Bowels movements have been occurring 2-3 times per day per his usual, normally after meals. He reports good appetite. Denies fever or chills.   Review of Systems As per history of present illness, otherwise negative  Current Medications, Allergies, Past Medical History, Past Surgical History, Family History and Social History were reviewed in [employer] record.     Objective:   Physical Exam BP 126/84 mmHg  Pulse 60  Ht 6' (1.829 m)  Wt 267 lb 4 oz (121.224 kg)  BMI 36.24 kg/m2 Constitutional: Well-developed and well-nourished. No distress. HEENT: Normocephalic and atraumatic. Oropharynx is clear and moist. No oropharyngeal exudate. Conjunctivae are normal.  No  scleral icterus. Neck: Neck supple. Trachea midline. Cardiovascular: Normal rate, regular rhythm and intact distal pulses. No M/R/G Pulmonary/chest: Effort normal and breath sounds normal. No wheezing, rales or rhonchi. Abdominal: Soft, obese, nontender, nondistended. Bowel sounds active throughout. There are no masses palpable. No hepatosplenomegaly. Extremities: no clubbing, cyanosis, or edema Lymphadenopathy: No cervical adenopathy noted. Neurological: Alert and oriented to person place and time. Skin: Skin is warm and dry. No rashes noted. Psychiatric: Normal mood and affect. Behavior is normal.  CT ABDOMEN AND PELVIS WITH CONTRAST   TECHNIQUE: Multidetector CT imaging of the abdomen and pelvis was performed using the standard protocol following bolus administration of intravenous contrast.   CONTRAST:  32mL OMNIPAQUE IOHEXOL 300 MG/ML SOLN, 142mL ISOVUE-300 IOPAMIDOL (ISOVUE-300) INJECTION 61%   COMPARISON:  CT dated 07/06/2013   FINDINGS: The visualized lung bases are clear. No intra-abdominal free air or free fluid.   Diffuse hepatic steatosis. A stable appearing 3.4 x 2.6 cm focal area of hypodensity the right lobe of the liver is not well characterized on this study but previously described as a hemangioma. Bold the gallbladder, pancreas, spleen, adrenal glands, kidneys, visualized ureters, and urinary bladder appear unremarkable. The prostate and seminal vesicles are grossly unremarkable.   Oral contrast noted in the distal small bowel and traverses into the colon without evidence of bowel obstruction. There is no active inflammatory changes of the bowel. The appendix appears unremarkable.   The abdominal aorta and IVC appear unremarkable. No portal venous gas identified. There is no adenopathy. Bold small fat containing umbilical hernia. Minimal  degenerative changes of the spine. No acute osseous pathology.   IMPRESSION: No acute intra-abdominal pelvic  pathology.     Electronically Signed   By: Anner Crete M.D.   On: 08/24/2015 21:34        Results for GRANDON, TORSIELLO (MRN [id number]) as of 11/02/2015 12:28  Ref. Range 06/11/2014 14:04 06/14/2014 14:05 06/14/2014 14:06 06/14/2014 14:50 06/17/2014 08:35 06/17/2014 09:04 06/17/2014 09:17 10/05/2014 09:35 08/24/2015 20:02 11/02/2015 09:27  Alkaline Phosphatase Latest Ref Range: 39-117 U/L     69   37 (L) 41 42  Albumin Latest Ref Range: 3.5-5.2 g/dL     3.9   4.1 4.2 4.3  Lipase Latest Ref Range: 11-51 U/L         23   AST Latest Ref Range: 0-37 U/L     54 (H)   35 29 109 (H)  ALT Latest Ref Range: 0-53 U/L     69 (H)   42 37 61 (H)  Total Protein Latest Ref Range: 6.0-8.3 g/dL     8.0   7.1 7.3 6.9  Bilirubin, Direct Latest Ref Range: 0.0-0.3 mg/dL     0.1   0.2    Indirect Bilirubin Latest Ref Range: 0.3-0.9 mg/dL     0.6       Total Bilirubin Latest Ref Range: 0.2-1.2 mg/dL     0.7   1.0 1.8 (H) 1.3 (H)       Assessment & Plan:  45 year old male with a history of IBS who is here for follow-up  1. Recurrent episodic midabdominal pain/IBS -- his bilirubin had increased to 1.9 on labs after his most recent attack. This was similar to an attack which occurred in February 2015. Bilirubin returns to normal in between attacks. Repeat CMP today shows elevated AST and ALT. I'm suspicious for possible intermittent CBD obstruction secondary to stone or even sphincter of oddi spasm. I recommended MRI with MRCP. Schedule Robinul Forte 2 mg every 12 hours for now. We consider video capsule endoscopy but with the liver enzyme abnormality I would prefer to evaluate biliary ducts first.  25 minutes spent with the patient today. Greater than 50% was spent in counseling and coordination of care with the patient

## 2015-11-02 NOTE — Patient Instructions (Addendum)
Your physician has requested that you go to the basement for the following lab work before leaving today: CMP  Take Robinul twice daily every day.  You have been scheduled for an MRI/MRCP at [facility] on 11/04/15. Your appointment time is 7:00 pm. Please arrive 15 minutes prior to your appointment time for registration purposes. Please make certain not to have anything to eat or drink 4 hours prior to your test. In addition, if you have any metal in your body, have a pacemaker or defibrillator, please be sure to let your ordering physician know. This test typically takes 45 minutes to 1 hour to complete.  If you are age 25 or older, your body mass index should be between 23-30. Your Body mass index is 36.24 kg/(m^2). If this is out of the aforementioned range listed, please consider follow up with your Primary Care Provider.  If you are age 42 or younger, your body mass index should be between 19-25. Your Body mass index is 36.24 kg/(m^2). If this is out of the aformentioned range listed, please consider follow up with your Primary Care Provider.

## 2015-11-04 ENCOUNTER — Other Ambulatory Visit: Payer: Self-pay | Admitting: Internal Medicine

## 2015-11-04 ENCOUNTER — Ambulatory Visit (HOSPITAL_COMMUNITY)
Admission: RE | Admit: 2015-11-04 | Discharge: 2015-11-04 | Disposition: A | Discharge status: Home / Self Care | Payer: 59 | Source: Ambulatory Visit | Attending: Internal Medicine | Admitting: Internal Medicine

## 2015-11-04 DIAGNOSIS — K760 Fatty (change of) liver, not elsewhere classified: Secondary | ICD-10-CM | POA: Insufficient documentation

## 2015-11-04 DIAGNOSIS — R109 Unspecified abdominal pain: Secondary | ICD-10-CM

## 2015-11-04 DIAGNOSIS — Q453 Other congenital malformations of pancreas and pancreatic duct: Secondary | ICD-10-CM | POA: Diagnosis not present

## 2015-11-04 DIAGNOSIS — Z01818 Encounter for other preprocedural examination: Secondary | ICD-10-CM | POA: Diagnosis not present

## 2015-11-04 DIAGNOSIS — M795 Residual foreign body in soft tissue: Secondary | ICD-10-CM

## 2015-11-04 DIAGNOSIS — R17 Unspecified jaundice: Secondary | ICD-10-CM | POA: Insufficient documentation

## 2015-11-04 DIAGNOSIS — D1803 Hemangioma of intra-abdominal structures: Secondary | ICD-10-CM | POA: Diagnosis not present

## 2015-11-04 MED ORDER — GADOBENATE DIMEGLUMINE 529 MG/ML IV SOLN
20.0000 mL | Freq: Once | INTRAVENOUS | Status: AC | PRN
  Administered 2015-11-04: 20 mL via INTRAVENOUS

## 2015-11-14 ENCOUNTER — Telehealth: Payer: Self-pay | Admitting: Internal Medicine

## 2015-11-15 ENCOUNTER — Telehealth: Payer: Self-pay | Admitting: Internal Medicine

## 2015-11-15 NOTE — Telephone Encounter (Signed)
Let pt know that records were faxed on 11/11/15 and per [facility] they will contact him today to schedule the appt.

## 2015-11-15 NOTE — Telephone Encounter (Signed)
Results placed in the mail to pt and he is aware.

## 2015-11-17 ENCOUNTER — Telehealth: Payer: Self-pay | Admitting: Internal Medicine

## 2015-11-17 NOTE — Telephone Encounter (Signed)
Spoke with [facility] and scheduler stated that he has not been contacted yet but that she will call him now to schedule.

## 2015-12-19 DIAGNOSIS — Q453 Other congenital malformations of pancreas and pancreatic duct: Secondary | ICD-10-CM

## 2015-12-19 DIAGNOSIS — R7989 Other specified abnormal findings of blood chemistry: Secondary | ICD-10-CM

## 2015-12-19 DIAGNOSIS — R109 Unspecified abdominal pain: Secondary | ICD-10-CM

## 2015-12-19 HISTORY — DX: Other specified abnormal findings of blood chemistry: R79.89

## 2015-12-19 HISTORY — DX: Other congenital malformations of pancreas and pancreatic duct: Q45.3

## 2015-12-19 HISTORY — DX: Unspecified abdominal pain: R10.9

## 2016-01-03 DIAGNOSIS — Z789 Other specified health status: Secondary | ICD-10-CM

## 2016-01-03 DIAGNOSIS — IMO0001 Reserved for inherently not codable concepts without codable children: Secondary | ICD-10-CM | POA: Insufficient documentation

## 2016-01-03 DIAGNOSIS — Z531 Procedure and treatment not carried out because of patient's decision for reasons of belief and group pressure: Secondary | ICD-10-CM | POA: Insufficient documentation

## 2016-01-03 HISTORY — DX: Other specified health status: Z78.9

## 2016-01-11 ENCOUNTER — Telehealth: Payer: Self-pay | Admitting: Internal Medicine

## 2016-01-11 NOTE — Telephone Encounter (Signed)
I did see the EUS result from [place] prior to my vacation The report was to be scanned into record. From my recollection this was normal/unremarkable.  Follow-up can be scheduled if his abdominal symptoms persist

## 2016-01-11 NOTE — Telephone Encounter (Signed)
Called and requested EUS report from [place]. Let pt know Dr. Hilarie Fredrickson is out of the office, will return his call regarding appt next week when Dr. Hilarie Fredrickson returns.  Report on Dr. Vena Rua desk for review.

## 2016-01-11 NOTE — Telephone Encounter (Signed)
Left message for pt to call back  °

## 2016-01-12 NOTE — Telephone Encounter (Signed)
Pt states he is not having issues now. He wanted to know if the doctor at [facility] had suggested her see a liver specialist. Pt seems to remember being told that his problems were from fatty liver and that he should see liver clinic.

## 2016-01-16 NOTE — Telephone Encounter (Signed)
Fatty liver seen by EUS, which is not felt to explain episodic abd pain issues However, this can cause chronic liver scarring and next available followup with me is appropriate to discuss this in detail No evidence of advanced liver disease is known or expected at present

## 2016-01-16 NOTE — Telephone Encounter (Signed)
Left message on machine to call back  

## 2016-01-16 NOTE — Telephone Encounter (Signed)
Patient returning phone call. Best # [phone]. Ok to leave a detailed message.

## 2016-01-16 NOTE — Telephone Encounter (Signed)
Pt notified of Dr Garth Schlatter recommendations and will call back if he has any GI complaints in the future.

## 2016-04-30 ENCOUNTER — Ambulatory Visit (INDEPENDENT_AMBULATORY_CARE_PROVIDER_SITE_OTHER): Payer: 59 | Admitting: Ophthalmology

## 2016-05-10 ENCOUNTER — Emergency Department (HOSPITAL_COMMUNITY): Payer: 59

## 2016-05-10 ENCOUNTER — Emergency Department (HOSPITAL_COMMUNITY)
Admission: EM | Admit: 2016-05-10 | Discharge: 2016-05-10 | Disposition: A | Discharge status: Home / Self Care | Payer: 59 | Attending: Emergency Medicine | Admitting: Emergency Medicine

## 2016-05-10 ENCOUNTER — Encounter (HOSPITAL_COMMUNITY): Payer: Self-pay | Admitting: Emergency Medicine

## 2016-05-10 DIAGNOSIS — I10 Essential (primary) hypertension: Secondary | ICD-10-CM | POA: Insufficient documentation

## 2016-05-10 DIAGNOSIS — R109 Unspecified abdominal pain: Secondary | ICD-10-CM | POA: Diagnosis present

## 2016-05-10 DIAGNOSIS — Z79899 Other long term (current) drug therapy: Secondary | ICD-10-CM | POA: Diagnosis not present

## 2016-05-10 DIAGNOSIS — R1084 Generalized abdominal pain: Secondary | ICD-10-CM | POA: Insufficient documentation

## 2016-05-10 LAB — COMPREHENSIVE METABOLIC PANEL
ALK PHOS: 47 U/L (ref 38–126)
ALT: 35 U/L (ref 17–63)
ANION GAP: 11 (ref 5–15)
AST: 36 U/L (ref 15–41)
Albumin: 4.8 g/dL (ref 3.5–5.0)
BUN: 13 mg/dL (ref 6–20)
CALCIUM: 10.2 mg/dL (ref 8.9–10.3)
CHLORIDE: 106 mmol/L (ref 101–111)
CO2: 23 mmol/L (ref 22–32)
CREATININE: 1.14 mg/dL (ref 0.61–1.24)
Glucose, Bld: 98 mg/dL (ref 65–99)
Potassium: 3.4 mmol/L — ABNORMAL LOW (ref 3.5–5.1)
SODIUM: 140 mmol/L (ref 135–145)
Total Bilirubin: 1.6 mg/dL — ABNORMAL HIGH (ref 0.3–1.2)
Total Protein: 7.8 g/dL (ref 6.5–8.1)

## 2016-05-10 LAB — CBC
HCT: 41.7 % (ref 39.0–52.0)
HEMOGLOBIN: 14.5 g/dL (ref 13.0–17.0)
MCH: 27.7 pg (ref 26.0–34.0)
MCHC: 34.8 g/dL (ref 30.0–36.0)
MCV: 79.7 fL (ref 78.0–100.0)
PLATELETS: 227 10*3/uL (ref 150–400)
RBC: 5.23 MIL/uL (ref 4.22–5.81)
RDW: 13.4 % (ref 11.5–15.5)
WBC: 6.2 10*3/uL (ref 4.0–10.5)

## 2016-05-10 LAB — URINALYSIS, ROUTINE W REFLEX MICROSCOPIC
Bilirubin Urine: NEGATIVE
Glucose, UA: NEGATIVE mg/dL
Hgb urine dipstick: NEGATIVE
Ketones, ur: NEGATIVE mg/dL
LEUKOCYTES UA: NEGATIVE
NITRITE: NEGATIVE
PROTEIN: NEGATIVE mg/dL
SPECIFIC GRAVITY, URINE: 1.006 (ref 1.005–1.030)
pH: 7 (ref 5.0–8.0)

## 2016-05-10 LAB — LIPASE, BLOOD: LIPASE: 25 U/L (ref 11–51)

## 2016-05-10 MED ORDER — DICYCLOMINE HCL 10 MG/ML IM SOLN
20.0000 mg | Freq: Once | INTRAMUSCULAR | Status: AC
  Administered 2016-05-10: 20 mg via INTRAMUSCULAR
  Filled 2016-05-10: qty 2

## 2016-05-10 MED ORDER — MORPHINE SULFATE (PF) 4 MG/ML IV SOLN
4.0000 mg | Freq: Once | INTRAVENOUS | Status: AC
  Administered 2016-05-10: 4 mg via INTRAVENOUS
  Filled 2016-05-10: qty 1

## 2016-05-10 MED ORDER — IOPAMIDOL (ISOVUE-300) INJECTION 61%
100.0000 mL | Freq: Once | INTRAVENOUS | Status: AC | PRN
  Administered 2016-05-10: 100 mL via INTRAVENOUS

## 2016-05-10 MED ORDER — IOPAMIDOL (ISOVUE-300) INJECTION 61%
INTRAVENOUS | Status: AC
  Filled 2016-05-10: qty 100

## 2016-05-10 MED ORDER — METOCLOPRAMIDE HCL 5 MG/ML IJ SOLN
10.0000 mg | Freq: Once | INTRAMUSCULAR | Status: AC
  Administered 2016-05-10: 10 mg via INTRAVENOUS
  Filled 2016-05-10: qty 2

## 2016-05-10 MED ORDER — ONDANSETRON HCL 4 MG/2ML IJ SOLN
4.0000 mg | Freq: Once | INTRAMUSCULAR | Status: AC
  Administered 2016-05-10: 4 mg via INTRAVENOUS
  Filled 2016-05-10: qty 2

## 2016-05-10 MED ORDER — ONDANSETRON HCL 4 MG PO TABS: 4.0000 mg | ORAL_TABLET | Freq: Four times a day (QID) | ORAL | 0 refills | Status: DC

## 2016-05-10 NOTE — ED Notes (Signed)
Patient transported to CT 

## 2016-05-10 NOTE — ED Notes (Signed)
ED Provider at bedside. 

## 2016-05-10 NOTE — ED Notes (Signed)
Patient d/c'd self care.  F/U and medications reviewed.  Patient verbalized understanding. 

## 2016-05-10 NOTE — ED Provider Notes (Signed)
[facility] DEPT Provider Note   CSN: [id number] Arrival date & time: 05/10/16  1047     History   Chief Complaint Chief Complaint  Patient presents with  . Abdominal Pain    HPI Andre Decker is a 45 y.o. male with a past medical history significant for IBS presenting with a similar abdominal pain "flare" but "less sharp" in character. He has medications specifically prescribed for these episodes that usually help him but he was at work and did not have it with him. He states that he was at work driving when the episode came on suddenly and when he reached his destination he "had tears in [his] eyes from the pain. The secretary called EMS and he was brought in to the Ed.Pain is located across the abdomen at the level of the navel and worse on the right side. It is constant and non-radiating. It is better in the fetal position. He has not tried anything for the pain. Reports chills, no fever, nausea, no vomiting, headache and lightheadedness. Denies SOB, C/P, diarrhea, blood in his stool, hematuria, dysuria, testicular pain or discharge.  HPI  Past Medical History:  Diagnosis Date  . Colon polyps   . Derangement of left knee   . 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 Duodenitis   . Glaucoma   . Hamstring tendinitis   . Hypertension   . IBS (irritable bowel syndrome)   . IBS (irritable bowel syndrome)   . IT band syndrome   . Osteoarthritis   . Sleep apnea   . Tear of medial meniscus of left knee     Patient Active Problem List   Diagnosis Date Noted  . Paresthesia of left foot 04/20/2015  . IRRITABLE BOWEL SYNDROME 05/23/2010  . OBESITY, UNSPECIFIED 03/15/2009  . HEMANGIOMA, HEPATIC 09/06/2007  . HYPERTENSION 09/06/2007    Past Surgical History:  Procedure Laterality Date  . CATARACT EXTRACTION Left    lens inplant  . EYE SURGERY Bilateral    part. detached retina       Home Medications    Prior to Admission medications     Medication Sig Start Date End Date Taking? Authorizing Provider  amLODipine (NORVASC) 5 MG tablet Take 5 mg by mouth every morning.  07/27/15  Yes Historical Provider, MD  AZOPT 1 % ophthalmic suspension Place 1 drop into both eyes 2 (two) times daily. 05/27/14  Yes Historical Provider, MD  bimatoprost (LUMIGAN) 0.01 % SOLN Place 1 drop into both eyes at bedtime.    Yes Historical Provider, MD  brimonidine-timolol (COMBIGAN) 0.2-0.5 % ophthalmic solution Place 1 drop into both eyes 2 (two) times daily.    Yes Historical Provider, MD  DUEXIS 800-26.6 MG TABS Take 1 tablet by mouth 3 (three) times daily. 03/23/16  Yes Historical Provider, MD  glycopyrrolate (ROBINUL) 2 MG tablet Take 1 tablet (2 mg total) by mouth 2 (two) times daily as needed. Patient taking differently: Take 2 mg by mouth 2 (two) times daily as needed (IBS).  08/09/15  Yes Jerene Bears, MD  hydrochlorothiazide (HYDRODIURIL) 12.5 MG tablet Take 12.5 mg by mouth every morning.  07/27/15  Yes Historical Provider, MD  valsartan (DIOVAN) 160 MG tablet Take 160 mg by mouth every morning.  07/27/15  Yes Historical Provider, MD  ondansetron (ZOFRAN) 4 MG tablet Take 1 tablet (4 mg total) by mouth every 6 (six) hours. 05/10/16   Emeline General, PA-C    Family History  Family History  Problem Relation Age of Onset  . Migraines Mother   . Prostate cancer Maternal Grandfather   . Prostate cancer Maternal Uncle   . Arthritis/Rheumatoid Maternal Grandmother   . Cancer Maternal Grandmother   . Arthritis/Rheumatoid Paternal Grandmother   . Stroke Paternal Grandfather   . Colon cancer Neg Hx     Social History Social History  Substance Use Topics  . Smoking status: Never Smoker  . Smokeless tobacco: Never Used  . Alcohol use No     Allergies   Patient has no known allergies.   Review of Systems Review of Systems  Constitutional: Positive for chills. Negative for diaphoresis and fever.  HENT: Negative for congestion, ear pain,  facial swelling, sinus pain, sinus pressure, sore throat and trouble swallowing.   Eyes: Negative for pain, discharge and visual disturbance.  Respiratory: Negative for cough, choking, chest tightness, shortness of breath, wheezing and stridor.   Cardiovascular: Negative for chest pain, palpitations and leg swelling.  Gastrointestinal: Positive for abdominal pain and nausea. Negative for abdominal distention, anal bleeding, blood in stool, constipation, diarrhea and vomiting.  Genitourinary: Negative for difficulty urinating, discharge, dysuria, flank pain, hematuria, penile pain, scrotal swelling, testicular pain and urgency.  Musculoskeletal: Negative for arthralgias, back pain, myalgias, neck pain and neck stiffness.  Skin: Negative for color change, pallor, rash and wound.  Neurological: Positive for light-headedness and headaches. Negative for dizziness, seizures, syncope, facial asymmetry, speech difficulty, weakness and numbness.  All other systems reviewed and are negative.    Physical Exam Updated Vital Signs BP 114/60 (BP Location: Right Arm)   Pulse 77   Temp 97.5 F (36.4 C) (Oral)   Resp 17   SpO2 97%   Physical Exam  Constitutional: He is oriented to person, place, and time. He appears well-developed and well-nourished. No distress.  Afebrile, non-toxic appearing, lying in bed on his left side  in semi fetal position in discomfort  HENT:  Head: Normocephalic and atraumatic.  Nose: Nose normal.  Mouth/Throat: Oropharynx is clear and moist. No oropharyngeal exudate.  Eyes: Conjunctivae are normal. Pupils are equal, round, and reactive to light. Right eye exhibits no discharge. Left eye exhibits no discharge. No scleral icterus.  Neck: Normal range of motion. Neck supple.  Cardiovascular: Normal rate, regular rhythm and normal heart sounds.   No murmur heard. Pulmonary/Chest: Effort normal and breath sounds normal. No respiratory distress. He has no wheezes. He has no  rales. He exhibits no tenderness.  Abdominal: Soft. He exhibits no distension and no mass. There is tenderness. There is no guarding. No hernia.  No peritoneal sign, tenderness with deep palpation mostly on the right at the navel level  Musculoskeletal: He exhibits no edema or deformity.  Neurological: He is alert and oriented to person, place, and time.  Skin: Skin is warm and dry. No rash noted. He is not diaphoretic. No erythema. No pallor.  Psychiatric: He has a normal mood and affect. His behavior is normal.  Nursing note and vitals reviewed.    ED Treatments / Results  Labs (all labs ordered are listed, but only abnormal results are displayed) Labs Reviewed  COMPREHENSIVE METABOLIC PANEL - Abnormal; Notable for the following:       Result Value   Potassium 3.4 (*)    Total Bilirubin 1.6 (*)    All other components within normal limits  URINALYSIS, ROUTINE W REFLEX MICROSCOPIC - Abnormal; Notable for the following:    Color, Urine STRAW (*)  All other components within normal limits  LIPASE, BLOOD  CBC    EKG  EKG Interpretation None       Radiology Ct Abdomen Pelvis W Contrast  Result Date: 05/10/2016 CLINICAL DATA:  45 year old male with right abdominal pain. Crohn disease, similar symptoms to prior Crohn flare. Initial encounter. EXAM: CT ABDOMEN AND PELVIS WITH CONTRAST TECHNIQUE: Multidetector CT imaging of the abdomen and pelvis was performed using the standard protocol following bolus administration of intravenous contrast. CONTRAST:  113mL ISOVUE-300 IOPAMIDOL (ISOVUE-300) INJECTION 61% COMPARISON:  CT Abdomen and Pelvis 08/24/2015 and earlier. FINDINGS: Lower chest: Larger lung volumes today. Negative lung bases. No pericardial or pleural effusion. No upper abdominal free air. Hepatobiliary: Hepatic steatosis and benign right hepatic lobe hemangioma (series 2, image 30). Negative gallbladder. No biliary ductal enlargement. Pancreas: Negative. Spleen: Negative.  Adrenals/Urinary Tract: Chronic round and relatively low-density small right adrenal nodules compatible with small right adrenal adenomas. Negative left adrenal gland. No perinephric stranding. Bilateral renal enhancement and contrast excretion is normal. Mild nonspecific bladder wall thickening appears stable. Stomach/Bowel: Retained stool in the rectosigmoid colon. Redundant sigmoid, distended with gas proximally. Negative left colon. Mild retained stool in the transverse colon. Negative right colon and appendix (series 2, image 62). Fluid-filled but otherwise negative terminal ileum. Other fluid-filled distal small bowel loops do not appear thick walled or inflamed. No abnormally dilated small bowel. No mesenteric stranding identified. Negative stomach and duodenum. Vascular/Lymphatic: Major arterial structures are normal. Portal venous system is patent. New line no lymphadenopathy. Reproductive: Negative. Other: No pelvic free fluid. Musculoskeletal: Stable lower lumbar spine degeneration. No acute osseous abnormality identified. IMPRESSION: 1. No acute or inflammatory process identified in the abdomen or pelvis. Normal appendix. No acute or chronically inflamed bowel identified. 2. Chronic hepatic steatosis. Chronic small probable benign right adrenal adenomas. Benign right lobe liver hemangioma. Electronically Signed   By: Genevie Ann M.D.   On: 05/10/2016 19:14    Procedures Procedures (including critical care time)  Medications Ordered in ED Medications  morphine 4 MG/ML injection 4 mg (4 mg Intravenous Given 05/10/16 1713)  ondansetron (ZOFRAN) injection 4 mg (4 mg Intravenous Given 05/10/16 1711)  dicyclomine (BENTYL) injection 20 mg (20 mg Intramuscular Given 05/10/16 1701)  iopamidol (ISOVUE-300) 61 % injection 100 mL (100 mLs Intravenous Contrast Given 05/10/16 1846)  metoCLOPramide (REGLAN) injection 10 mg (10 mg Intravenous Given 05/10/16 1950)     Initial Impression / Assessment and Plan / ED  Course  I have reviewed the triage vital signs and the nursing notes.  Pertinent labs & imaging results that were available during my care of the patient were reviewed by me and considered in my medical decision making (see chart for details).  Clinical Course    45 y/o male with a past medical history significant for IBS presenting with abdominal pain. Has been followed by gastroenterology and has had extensive work-up for similar symptoms. He states that this is similar to his typical episodes except it is "less sharp" in character. He was prescribed glycopyrrolate PRN for episodes like these, but did not have it in his possession when the pain started.  16:15- patient decline CT scan at this time and opted for pain and nausea control and re-assessment. The pain is similar to his prior episodes and he feels like the scan will be negative. We discussed the option of reevaluating his pain after analgesia and decide if we should scan then.  17:20- went to check on patient. He had just  received ordered meds. Will return to reassess, once medications have taken effect. Still nauseated and in pain at this time. Provided him with a warm blanket.  18:15- patient was now feeling better, less tenderness to palpation and reports improvement, but now has a headache. Discussed CT and patient now wants to know if there is anything going on and would feel better knowing. Ordered abdomen and pelvis contrast CT  19:30 - Discussed CT results with patient. He feels like the pain is slowly returning and he is still nauseated. Discussed reglan to help with nausea and headache and patient is agreeable to going home. He is asking if he can eat now. Will reassess after medication.   20:30 - patient was sleeping and headache and nausea resolved. He also had something to drink and was tolerating PO intake well. Patient was ready to go home.  Will discharge home with zofran for nausea and close follow up with GI. Patient  was non-toxic appearing, afebrile, no peritoneal signs, pain and nausea were well controlled prior to discharge. Patient is reassured that his CT did not show any emergent acute processes and is agreeable to discharge plan.   Patient was also seen by supervising PA, [facility], who agrees with assessment and plan.  Discussed strict return precautions. Patient was advised to return to the emergency department if experiencing any worsening of symptoms including but not limited to fever, chills, nausea, vomiting, blood in the stool, or any other concerning worsening symptoms.    Final Clinical Impressions(s) / ED Diagnoses   Final diagnoses:  Generalized abdominal pain    New Prescriptions Discharge Medication List as of 05/10/2016  8:32 PM    START taking these medications   Details  ondansetron (ZOFRAN) 4 MG tablet Take 1 tablet (4 mg total) by mouth every 6 (six) hours., Starting Thu 05/10/2016, Print         Emeline General, PA-C 05/11/16 [place], MD 05/17/16 [phone]

## 2016-05-10 NOTE — ED Triage Notes (Signed)
Per EMS pt c/o abdominal pain, no nausea, emesis. Hx Crohns disease, states symptoms feel like typical Chrohns flare.

## 2016-05-21 ENCOUNTER — Telehealth: Payer: Self-pay | Admitting: *Deleted

## 2016-05-21 ENCOUNTER — Ambulatory Visit (INDEPENDENT_AMBULATORY_CARE_PROVIDER_SITE_OTHER): Payer: 59 | Admitting: Ophthalmology

## 2016-05-21 ENCOUNTER — Encounter: Payer: Self-pay | Admitting: Physician Assistant

## 2016-05-21 ENCOUNTER — Ambulatory Visit (INDEPENDENT_AMBULATORY_CARE_PROVIDER_SITE_OTHER): Payer: 59 | Admitting: Physician Assistant

## 2016-05-21 VITALS — BP 106/74 | HR 60 | Ht 72.0 in | Wt 269.2 lb

## 2016-05-21 DIAGNOSIS — H33301 Unspecified retinal break, right eye: Secondary | ICD-10-CM

## 2016-05-21 DIAGNOSIS — H35033 Hypertensive retinopathy, bilateral: Secondary | ICD-10-CM | POA: Diagnosis not present

## 2016-05-21 DIAGNOSIS — H43813 Vitreous degeneration, bilateral: Secondary | ICD-10-CM | POA: Diagnosis not present

## 2016-05-21 DIAGNOSIS — R1011 Right upper quadrant pain: Secondary | ICD-10-CM

## 2016-05-21 DIAGNOSIS — H338 Other retinal detachments: Secondary | ICD-10-CM

## 2016-05-21 DIAGNOSIS — I10 Essential (primary) hypertension: Secondary | ICD-10-CM

## 2016-05-21 DIAGNOSIS — R110 Nausea: Secondary | ICD-10-CM | POA: Diagnosis not present

## 2016-05-21 MED ORDER — HYDROCODONE-ACETAMINOPHEN 5-500 MG PO TABS: ORAL_TABLET | ORAL | 0 refills | Status: DC

## 2016-05-21 NOTE — Patient Instructions (Signed)
Take the Robinull Forte 2 mg twice daily as needed. We have provided you with a Vicodin prescription to take to your pharmacy.  You have been scheduled for a HIDA scan at [facility] on Wed 12-20 at 11:00 am . Please arrive 15 minutes prior to your scheduled appointment at  [id number] am. Make certain not to have anything to eat or drink at least 6-8 hours prior to your test. Should this appointment date or time not work well for you, please call radiology scheduling at [phone].  You will go to the [employer]. Then go to radiology.   We will also refer you to [facility] for a consult with a provider there.  We will call you with the appointment. _____________________________________________________________________ hepatobiliary (HIDA) scan is an imaging procedure used to diagnose problems in the liver, gallbladder and bile ducts. In the HIDA scan, a radioactive chemical or tracer is injected into a vein in your arm. The tracer is handled by the liver like bile. Bile is a fluid produced and excreted by your liver that helps your digestive system break down fats in the foods you eat. Bile is stored in your gallbladder and the gallbladder releases the bile when you eat a meal. A special nuclear medicine scanner (gamma camera) tracks the flow of the tracer from your liver into your gallbladder and small intestine.  During your HIDA scan  You'll be asked to change into a hospital gown before your HIDA scan begins. Your health care team will position you on a table, usually on your back. The radioactive tracer is then injected into a vein in your arm.The tracer travels through your bloodstream to your liver, where it's taken up by the bile-producing cells. The radioactive tracer travels with the bile from your liver into your gallbladder and through your bile ducts to your small intestine.You may feel some pressure while the radioactive tracer is injected into your vein. As  you lie on the table, a special gamma camera is positioned over your abdomen taking pictures of the tracer as it moves through your body. The gamma camera takes pictures continually for about an hour. You'll need to keep still during the HIDA scan. This can become uncomfortable, but you may find that you can lessen the discomfort by taking deep breaths and thinking about other things. Tell your health care team if you're uncomfortable. The radiologist will watch on a computer the progress of the radioactive tracer through your body. The HIDA scan may be stopped when the radioactive tracer is seen in the gallbladder and enters your small intestine. This typically takes about an hour. In some cases extra imaging will be performed if original images aren't satisfactory, if morphine is given to help visualize the gallbladder or if the medication CCK is given to look at the contraction of the gallbladder. This test typically takes 2 hours to complete. ________________________________________________________________________

## 2016-05-21 NOTE — Telephone Encounter (Signed)
Faxed referral information, office note from [employer], insurance card information, demographics to Santiago Glad at [place], [phone].

## 2016-05-21 NOTE — Progress Notes (Addendum)
Subjective:    Patient ID: Andre Decker, male    DOB: [birth year], 45 y.o.   MRN: [id number]  HPI Andre Decker  is a very nice 45 year old African-American male known to Dr. Hilarie Fredrickson. He carries diagnosis of hypertension, obesity and IBS. He has been undergoing evaluation for episodes of recurrent severe upper abdominal pain. Patient says he has had symptoms over the past couple of years but that his episodes have gotten much more intense and somewhat more frequent. He had undergone an EGD and colonoscopy with Dr. Hilarie Fredrickson and 2014 both of which were normal. At that time he also had CCK HIDA scan and upper abdominal ultrasound which were negative. He was treated with IV guard and Robinul Forte. He was last seen in our office in May 2017. He had CT of the abdomen and pelvis done in March 2017 because of an episode of severe pain which occurred abruptly lasted for multiple hours and had him curled into a fetal position. This was a negative exam. He has had a mild increase in his bilirubin which is been documented on more than one occasion with these attacks. An labs done in May 2017 showed a mildly elevated AST and ALT. There was suspicion for intermittent common bile duct obstruction or sphincter of Oddi  spasm and he was then scheduled for MRI and MRCP which was done in June. Mild diffuse hepatic steatosis, normal gallbladder common bile duct of 3 mm and a 3.5 cm hemangioma in the right lobe of the liver. Also noted pancreas  Divisum. He was subsequently referred to [facility] for upper EUS which was completed on 01/03/2016. Other than noting the pancreas to be some exam was unremarkable. It was their impression that he did not have sphincter of Oddi spasm though he did not undergo biliary manometry. Patient says he is currently having about 2 severe episodes per month and may have milder episode in between. He had an ER visit on 05/10/2016 with a prolonged episode of intense severe pain very similar to  his others. He says he only relief he can get his to control up in a fetal position. He is generally nauseated with these episodes, does not have any diaphoresis, generally no diarrhea and this last episode lasted for almost 12 hours. He had repeat CT of the abdomen and pelvis done which showed hepatic steatosis and otherwise negative exam. Total bili was 1.6, LFTs otherwise unremarkable.  Review of Systems Pertinent positive and negative review of systems were noted in the above HPI section.  All other review of systems was otherwise negative.  Outpatient Encounter Prescriptions as of 05/21/2016  Medication Sig  . amLODipine (NORVASC) 5 MG tablet Take 5 mg by mouth every morning.   . AZOPT 1 % ophthalmic suspension Place 1 drop into both eyes 2 (two) times daily.  . bimatoprost (LUMIGAN) 0.01 % SOLN Place 1 drop into both eyes at bedtime.   . brimonidine-timolol (COMBIGAN) 0.2-0.5 % ophthalmic solution Place 1 drop into both eyes 2 (two) times daily.   Marland Kitchen glycopyrrolate (ROBINUL) 2 MG tablet 2 (two) times daily.  . hydrochlorothiazide (HYDRODIURIL) 12.5 MG tablet Take 12.5 mg by mouth every morning.   . ondansetron (ZOFRAN) 4 MG tablet Take 1 tablet (4 mg total) by mouth every 6 (six) hours.  . valsartan (DIOVAN) 160 MG tablet Take 160 mg by mouth every morning.   . [DISCONTINUED] DUEXIS 800-26.6 MG TABS Take 1 tablet by mouth 3 (three) times daily.  Marland Kitchen HYDROcodone-acetaminophen (  VICODIN) 5-500 MG tablet Take 1 tab every 4-6  Hours as needed for pain.  . [DISCONTINUED] glycopyrrolate (ROBINUL) 2 MG tablet Take 1 tablet (2 mg total) by mouth 2 (two) times daily as needed. (Patient taking differently: Take 2 mg by mouth 2 (two) times daily as needed (IBS). )   No facility-administered encounter medications on file as of 05/21/2016.    No Known Allergies Patient Active Problem List   Diagnosis Date Noted  . Paresthesia of left foot 04/20/2015  . IRRITABLE BOWEL SYNDROME 05/23/2010  . OBESITY,  UNSPECIFIED 03/15/2009  . HEMANGIOMA, HEPATIC 09/06/2007  . HYPERTENSION 09/06/2007   Social History   Social History  . Marital status: Married    Spouse name: N/A  . Number of children: 1  . Years of education: 83   Occupational History  . [employer]   Social History Main Topics  . Smoking status: Never Smoker  . Smokeless tobacco: Never Used  . Alcohol use No  . Drug use: No  . Sexual activity: Not on file   Other Topics Concern  . Not on file   Social History Narrative   Patient does not drink caffeine.   Patient is left handed.     Andre Decker family history includes Arthritis/Rheumatoid in his maternal grandmother and paternal grandmother; Cancer in his maternal grandmother; Migraines in his mother; Prostate cancer in his maternal grandfather and maternal uncle; Stroke in his paternal grandfather.      Objective:    Vitals:   05/21/16 0900  BP: 106/74  Pulse: 60    Physical Exam  well-developed African-American and American male in no acute distress, accompanied by his wife, pleasant blood pressure 106/74 pulse 60 height 6 foot weight 269 BMI 36.5. HEENT ;nontraumatic normocephalic EOMI PERRLA sclera anicteric, Cardiovascular ;regular rate and rhythm with S1-S2 no murmur or gallop, Pulmonary; clear bilaterally, Abdomen; soft,nondistended bowel sounds are active there is no palpable mass or hepatosplenomegaly, Rectal ;exam not done, Ext; no clubbing cyanosis or edema skin warm and dry, Neuropsych; mood and affect appropriate       Assessment & Plan:   #47 45 year old African-American male with recurrent episodes of severe right upper quadrant pain generally without radiation, associated with nausea and lasting for multiple hours. Extensive workup to date unrevealing. Recent upper EUS done at [facility] negative with exception of pancreas divisum Patient has had mild elevation in bilirubin with episodes and on one occasion mildly elevated  transaminases increase and suspicion for biliary cause  His symptoms are consistent with biliary colic though he has had a normal-appearing gallbladder,  rule out biliary dyskinesia ,also need to consider sphincter of Oddi spasm  2 IBS #3 hypertension #4 obesity  #5 hepatic steatosis  Plan; Will repeat CCK HIDA scan, last exam was done 3 years ago. Believe he should be considered for laparoscopic cholecystectomy and we'll refer to surgery for opinion after CCK HIDA scan completed Continue Robinul Forte on a when necessary basis Have also given him a prescription for Vicodin 5/500 one by mouth every 4-6 hours when necessary for episodes of severe pain so that he can hopefully avoid repeated ER visits.    Andre Decker S Laryn Venning PA-C 05/21/2016   Cc: Jonathon Jordan, MD   Addendum: Reviewed and agree with management. Jerene Bears, MD

## 2016-05-22 ENCOUNTER — Telehealth: Payer: Self-pay | Admitting: Physician Assistant

## 2016-05-22 ENCOUNTER — Other Ambulatory Visit: Payer: Self-pay | Admitting: *Deleted

## 2016-05-22 MED ORDER — HYDROCODONE-ACETAMINOPHEN 5-325 MG PO TABS: 1.0000 | ORAL_TABLET | Freq: Four times a day (QID) | ORAL | 0 refills | Status: DC | PRN

## 2016-05-22 NOTE — Telephone Encounter (Signed)
I called Andre Decker and they had given the script back to the patient. I called and LM for him to come to our office to pick up the new script, Vicodin 5/325 mg.

## 2016-05-22 NOTE — Telephone Encounter (Signed)
I called and left another message for the patient that his prescription is at our front desk.

## 2016-05-23 ENCOUNTER — Ambulatory Visit (HOSPITAL_COMMUNITY)
Admission: RE | Admit: 2016-05-23 | Discharge: 2016-05-23 | Disposition: A | Discharge status: Home / Self Care | Payer: 59 | Source: Ambulatory Visit | Attending: Physician Assistant | Admitting: Physician Assistant

## 2016-05-23 DIAGNOSIS — R1011 Right upper quadrant pain: Secondary | ICD-10-CM | POA: Insufficient documentation

## 2016-05-23 DIAGNOSIS — R110 Nausea: Secondary | ICD-10-CM | POA: Insufficient documentation

## 2016-05-23 MED ORDER — TECHNETIUM TC 99M MEBROFENIN IV KIT
5.0000 | PACK | Freq: Once | INTRAVENOUS | Status: AC | PRN
  Administered 2016-05-23: 5 via INTRAVENOUS

## 2016-06-08 DIAGNOSIS — K828 Other specified diseases of gallbladder: Secondary | ICD-10-CM | POA: Diagnosis not present

## 2016-06-13 ENCOUNTER — Ambulatory Visit: Payer: Self-pay | Admitting: General Surgery

## 2016-06-15 DIAGNOSIS — K828 Other specified diseases of gallbladder: Secondary | ICD-10-CM | POA: Diagnosis not present

## 2016-06-15 DIAGNOSIS — I10 Essential (primary) hypertension: Secondary | ICD-10-CM | POA: Diagnosis not present

## 2016-08-22 DIAGNOSIS — Z79899 Other long term (current) drug therapy: Secondary | ICD-10-CM | POA: Diagnosis not present

## 2016-08-22 DIAGNOSIS — E7800 Pure hypercholesterolemia, unspecified: Secondary | ICD-10-CM | POA: Diagnosis not present

## 2016-08-22 DIAGNOSIS — Z23 Encounter for immunization: Secondary | ICD-10-CM | POA: Diagnosis not present

## 2016-08-22 DIAGNOSIS — Z0000 Encounter for general adult medical examination without abnormal findings: Secondary | ICD-10-CM | POA: Diagnosis not present

## 2016-09-06 DIAGNOSIS — Z961 Presence of intraocular lens: Secondary | ICD-10-CM | POA: Diagnosis not present

## 2016-09-06 DIAGNOSIS — H2511 Age-related nuclear cataract, right eye: Secondary | ICD-10-CM | POA: Diagnosis not present

## 2016-09-06 DIAGNOSIS — H26492 Other secondary cataract, left eye: Secondary | ICD-10-CM | POA: Diagnosis not present

## 2016-09-06 DIAGNOSIS — H401131 Primary open-angle glaucoma, bilateral, mild stage: Secondary | ICD-10-CM | POA: Diagnosis not present

## 2016-09-14 ENCOUNTER — Ambulatory Visit: Payer: Self-pay | Admitting: General Surgery

## 2016-09-20 ENCOUNTER — Encounter (HOSPITAL_BASED_OUTPATIENT_CLINIC_OR_DEPARTMENT_OTHER): Payer: Self-pay | Admitting: *Deleted

## 2016-09-20 NOTE — Progress Notes (Signed)
NPO AFTER MN.  ARRIVE AT 0915.  NEEDS EKG.  GETTING LAB WORK DONE Friday 09-21-2016 OR Monday 09-24-2016 (CBCdiff, CMET).  WILL TAKE DIOVAN AND NORVASC AM DOS W/ SIPS OF WATER AND IF NEEDED TAKE [place].  WILL DO HIBICLENS SHOWER HS BEFORE AND AM DOS.

## 2016-09-24 DIAGNOSIS — Z8261 Family history of arthritis: Secondary | ICD-10-CM | POA: Diagnosis not present

## 2016-09-24 DIAGNOSIS — H409 Unspecified glaucoma: Secondary | ICD-10-CM | POA: Diagnosis not present

## 2016-09-24 DIAGNOSIS — K8050 Calculus of bile duct without cholangitis or cholecystitis without obstruction: Secondary | ICD-10-CM | POA: Diagnosis not present

## 2016-09-24 DIAGNOSIS — Z79899 Other long term (current) drug therapy: Secondary | ICD-10-CM | POA: Diagnosis not present

## 2016-09-24 DIAGNOSIS — Z79891 Long term (current) use of opiate analgesic: Secondary | ICD-10-CM | POA: Diagnosis not present

## 2016-09-24 DIAGNOSIS — Z8042 Family history of malignant neoplasm of prostate: Secondary | ICD-10-CM | POA: Diagnosis not present

## 2016-09-24 DIAGNOSIS — I10 Essential (primary) hypertension: Secondary | ICD-10-CM | POA: Diagnosis not present

## 2016-09-24 DIAGNOSIS — Z9842 Cataract extraction status, left eye: Secondary | ICD-10-CM | POA: Diagnosis not present

## 2016-09-24 DIAGNOSIS — Z823 Family history of stroke: Secondary | ICD-10-CM | POA: Diagnosis not present

## 2016-09-24 DIAGNOSIS — Z9889 Other specified postprocedural states: Secondary | ICD-10-CM | POA: Diagnosis not present

## 2016-09-24 DIAGNOSIS — M7630 Iliotibial band syndrome, unspecified leg: Secondary | ICD-10-CM | POA: Diagnosis not present

## 2016-09-24 DIAGNOSIS — K589 Irritable bowel syndrome without diarrhea: Secondary | ICD-10-CM | POA: Diagnosis not present

## 2016-09-24 DIAGNOSIS — Z820 Family history of epilepsy and other diseases of the nervous system: Secondary | ICD-10-CM | POA: Diagnosis not present

## 2016-09-24 DIAGNOSIS — G4733 Obstructive sleep apnea (adult) (pediatric): Secondary | ICD-10-CM | POA: Diagnosis not present

## 2016-09-24 DIAGNOSIS — R001 Bradycardia, unspecified: Secondary | ICD-10-CM | POA: Diagnosis not present

## 2016-09-24 DIAGNOSIS — Z8249 Family history of ischemic heart disease and other diseases of the circulatory system: Secondary | ICD-10-CM | POA: Diagnosis not present

## 2016-09-24 DIAGNOSIS — Z961 Presence of intraocular lens: Secondary | ICD-10-CM | POA: Diagnosis not present

## 2016-09-24 DIAGNOSIS — M1990 Unspecified osteoarthritis, unspecified site: Secondary | ICD-10-CM | POA: Diagnosis not present

## 2016-09-24 DIAGNOSIS — Z538 Procedure and treatment not carried out for other reasons: Secondary | ICD-10-CM | POA: Diagnosis not present

## 2016-09-24 LAB — CBC WITH DIFFERENTIAL/PLATELET
BASOS PCT: 0 %
Basophils Absolute: 0 10*3/uL (ref 0.0–0.1)
Eosinophils Absolute: 0 10*3/uL (ref 0.0–0.7)
Eosinophils Relative: 0 %
HEMATOCRIT: 36.3 % — AB (ref 39.0–52.0)
Hemoglobin: 12.4 g/dL — ABNORMAL LOW (ref 13.0–17.0)
LYMPHS ABS: 2 10*3/uL (ref 0.7–4.0)
Lymphocytes Relative: 40 %
MCH: 27 pg (ref 26.0–34.0)
MCHC: 34.2 g/dL (ref 30.0–36.0)
MCV: 78.9 fL (ref 78.0–100.0)
MONO ABS: 0.3 10*3/uL (ref 0.1–1.0)
MONOS PCT: 6 %
NEUTROS ABS: 2.7 10*3/uL (ref 1.7–7.7)
Neutrophils Relative %: 54 %
Platelets: 170 10*3/uL (ref 150–400)
RBC: 4.6 MIL/uL (ref 4.22–5.81)
RDW: 13.5 % (ref 11.5–15.5)
WBC: 5 10*3/uL (ref 4.0–10.5)

## 2016-09-24 LAB — COMPREHENSIVE METABOLIC PANEL
ALBUMIN: 4.3 g/dL (ref 3.5–5.0)
ALT: 25 U/L (ref 17–63)
ANION GAP: 6 (ref 5–15)
AST: 30 U/L (ref 15–41)
Alkaline Phosphatase: 37 U/L — ABNORMAL LOW (ref 38–126)
BILIRUBIN TOTAL: 1.1 mg/dL (ref 0.3–1.2)
BUN: 11 mg/dL (ref 6–20)
CO2: 24 mmol/L (ref 22–32)
Calcium: 9 mg/dL (ref 8.9–10.3)
Chloride: 110 mmol/L (ref 101–111)
Creatinine, Ser: 1.08 mg/dL (ref 0.61–1.24)
Glucose, Bld: 104 mg/dL — ABNORMAL HIGH (ref 65–99)
POTASSIUM: 3.6 mmol/L (ref 3.5–5.1)
Sodium: 140 mmol/L (ref 135–145)
Total Protein: 7.1 g/dL (ref 6.5–8.1)

## 2016-09-26 ENCOUNTER — Ambulatory Visit (HOSPITAL_BASED_OUTPATIENT_CLINIC_OR_DEPARTMENT_OTHER): Payer: 59 | Admitting: Anesthesiology

## 2016-09-26 ENCOUNTER — Encounter (HOSPITAL_BASED_OUTPATIENT_CLINIC_OR_DEPARTMENT_OTHER)
Admission: RE | Disposition: A | Discharge status: Home / Self Care | Payer: Self-pay | Source: Ambulatory Visit | Attending: General Surgery

## 2016-09-26 ENCOUNTER — Encounter (HOSPITAL_BASED_OUTPATIENT_CLINIC_OR_DEPARTMENT_OTHER): Payer: Self-pay | Admitting: Anesthesiology

## 2016-09-26 ENCOUNTER — Telehealth (HOSPITAL_COMMUNITY): Payer: Self-pay | Admitting: Internal Medicine

## 2016-09-26 ENCOUNTER — Ambulatory Visit (HOSPITAL_BASED_OUTPATIENT_CLINIC_OR_DEPARTMENT_OTHER)
Admission: RE | Admit: 2016-09-26 | Discharge: 2016-09-26 | Disposition: A | Discharge status: Home / Self Care | Payer: 59 | Source: Ambulatory Visit | Attending: General Surgery | Admitting: General Surgery

## 2016-09-26 ENCOUNTER — Other Ambulatory Visit: Payer: Self-pay | Admitting: *Deleted

## 2016-09-26 DIAGNOSIS — M7630 Iliotibial band syndrome, unspecified leg: Secondary | ICD-10-CM | POA: Insufficient documentation

## 2016-09-26 DIAGNOSIS — Z8249 Family history of ischemic heart disease and other diseases of the circulatory system: Secondary | ICD-10-CM | POA: Insufficient documentation

## 2016-09-26 DIAGNOSIS — Z538 Procedure and treatment not carried out for other reasons: Secondary | ICD-10-CM | POA: Insufficient documentation

## 2016-09-26 DIAGNOSIS — I10 Essential (primary) hypertension: Secondary | ICD-10-CM | POA: Insufficient documentation

## 2016-09-26 DIAGNOSIS — Z79891 Long term (current) use of opiate analgesic: Secondary | ICD-10-CM | POA: Insufficient documentation

## 2016-09-26 DIAGNOSIS — Z9889 Other specified postprocedural states: Secondary | ICD-10-CM | POA: Insufficient documentation

## 2016-09-26 DIAGNOSIS — M1990 Unspecified osteoarthritis, unspecified site: Secondary | ICD-10-CM | POA: Insufficient documentation

## 2016-09-26 DIAGNOSIS — H409 Unspecified glaucoma: Secondary | ICD-10-CM | POA: Insufficient documentation

## 2016-09-26 DIAGNOSIS — Z8042 Family history of malignant neoplasm of prostate: Secondary | ICD-10-CM | POA: Insufficient documentation

## 2016-09-26 DIAGNOSIS — K589 Irritable bowel syndrome without diarrhea: Secondary | ICD-10-CM | POA: Diagnosis not present

## 2016-09-26 DIAGNOSIS — R001 Bradycardia, unspecified: Secondary | ICD-10-CM

## 2016-09-26 DIAGNOSIS — Z820 Family history of epilepsy and other diseases of the nervous system: Secondary | ICD-10-CM | POA: Insufficient documentation

## 2016-09-26 DIAGNOSIS — Z9842 Cataract extraction status, left eye: Secondary | ICD-10-CM | POA: Insufficient documentation

## 2016-09-26 DIAGNOSIS — K8050 Calculus of bile duct without cholangitis or cholecystitis without obstruction: Secondary | ICD-10-CM | POA: Diagnosis not present

## 2016-09-26 DIAGNOSIS — Z823 Family history of stroke: Secondary | ICD-10-CM | POA: Insufficient documentation

## 2016-09-26 DIAGNOSIS — Z8261 Family history of arthritis: Secondary | ICD-10-CM | POA: Insufficient documentation

## 2016-09-26 DIAGNOSIS — Z961 Presence of intraocular lens: Secondary | ICD-10-CM | POA: Insufficient documentation

## 2016-09-26 DIAGNOSIS — G4733 Obstructive sleep apnea (adult) (pediatric): Secondary | ICD-10-CM | POA: Insufficient documentation

## 2016-09-26 DIAGNOSIS — Z79899 Other long term (current) drug therapy: Secondary | ICD-10-CM | POA: Insufficient documentation

## 2016-09-26 HISTORY — DX: Hemangioma of intra-abdominal structures: D18.03

## 2016-09-26 HISTORY — DX: Dependence on other enabling machines and devices: Z99.89

## 2016-09-26 HISTORY — DX: Unspecified glaucoma: H40.9

## 2016-09-26 HISTORY — DX: Calculus of bile duct without cholangitis or cholecystitis without obstruction: K80.50

## 2016-09-26 HISTORY — DX: Obstructive sleep apnea (adult) (pediatric): G47.33

## 2016-09-26 HISTORY — DX: Bradycardia, unspecified: R00.1

## 2016-09-26 HISTORY — DX: Presence of spectacles and contact lenses: Z97.3

## 2016-09-26 SURGERY — LAPAROSCOPIC CHOLECYSTECTOMY
Anesthesia: General

## 2016-09-26 MED ORDER — CELECOXIB 200 MG PO CAPS
ORAL_CAPSULE | ORAL | Status: AC
  Filled 2016-09-26: qty 2

## 2016-09-26 MED ORDER — PROPOFOL 10 MG/ML IV BOLUS
INTRAVENOUS | Status: AC
  Filled 2016-09-26: qty 40

## 2016-09-26 MED ORDER — ACETAMINOPHEN 500 MG PO TABS
ORAL_TABLET | ORAL | Status: AC
  Filled 2016-09-26: qty 2

## 2016-09-26 MED ORDER — CELECOXIB 400 MG PO CAPS
400.0000 mg | ORAL_CAPSULE | ORAL | Status: AC
  Administered 2016-09-26: 400 mg via ORAL
  Filled 2016-09-26: qty 1

## 2016-09-26 MED ORDER — LIDOCAINE 2% (20 MG/ML) 5 ML SYRINGE
INTRAMUSCULAR | Status: AC
  Filled 2016-09-26: qty 5

## 2016-09-26 MED ORDER — FENTANYL CITRATE (PF) 250 MCG/5ML IJ SOLN
INTRAMUSCULAR | Status: AC
  Filled 2016-09-26: qty 5

## 2016-09-26 MED ORDER — CEFOTETAN DISODIUM-DEXTROSE 2-2.08 GM-% IV SOLR
INTRAVENOUS | Status: AC
  Filled 2016-09-26: qty 50

## 2016-09-26 MED ORDER — BUPIVACAINE HCL 0.5 % IJ SOLN: INTRAMUSCULAR | Status: DC | PRN

## 2016-09-26 MED ORDER — ONDANSETRON HCL 4 MG/2ML IJ SOLN
INTRAMUSCULAR | Status: AC
  Filled 2016-09-26: qty 2

## 2016-09-26 MED ORDER — ROCURONIUM BROMIDE 50 MG/5ML IV SOSY
PREFILLED_SYRINGE | INTRAVENOUS | Status: AC
  Filled 2016-09-26: qty 5

## 2016-09-26 MED ORDER — ARTIFICIAL TEARS OPHTHALMIC OINT
TOPICAL_OINTMENT | OPHTHALMIC | Status: AC
  Filled 2016-09-26: qty 3.5

## 2016-09-26 MED ORDER — SUGAMMADEX SODIUM 200 MG/2ML IV SOLN
INTRAVENOUS | Status: AC
  Filled 2016-09-26: qty 2

## 2016-09-26 MED ORDER — GABAPENTIN 300 MG PO CAPS
300.0000 mg | ORAL_CAPSULE | ORAL | Status: AC
  Administered 2016-09-26: 300 mg via ORAL
  Filled 2016-09-26: qty 1

## 2016-09-26 MED ORDER — GLYCOPYRROLATE 0.2 MG/ML IV SOSY
PREFILLED_SYRINGE | INTRAVENOUS | Status: DC | PRN
  Administered 2016-09-26: 0.6 mg via INTRAVENOUS

## 2016-09-26 MED ORDER — ACETAMINOPHEN 500 MG PO TABS
1000.0000 mg | ORAL_TABLET | ORAL | Status: AC
  Administered 2016-09-26: 1000 mg via ORAL
  Filled 2016-09-26: qty 2

## 2016-09-26 MED ORDER — CHLORHEXIDINE GLUCONATE CLOTH 2 % EX PADS
6.0000 | MEDICATED_PAD | Freq: Once | CUTANEOUS | Status: DC
  Filled 2016-09-26: qty 6

## 2016-09-26 MED ORDER — MIDAZOLAM HCL 2 MG/2ML IJ SOLN
INTRAMUSCULAR | Status: AC
  Filled 2016-09-26: qty 2

## 2016-09-26 MED ORDER — LACTATED RINGERS IV SOLN
INTRAVENOUS | Status: DC
  Administered 2016-09-26: 10:00:00 via INTRAVENOUS
  Filled 2016-09-26: qty 1000

## 2016-09-26 MED ORDER — DEXTROSE 5 % IV SOLN
2.0000 g | INTRAVENOUS | Status: DC
  Filled 2016-09-26: qty 2

## 2016-09-26 MED ORDER — BUPIVACAINE HCL (PF) 0.5 % IJ SOLN
INTRAMUSCULAR | Status: AC
  Filled 2016-09-26: qty 30

## 2016-09-26 MED ORDER — KETOROLAC TROMETHAMINE 30 MG/ML IJ SOLN
INTRAMUSCULAR | Status: AC
  Filled 2016-09-26: qty 1

## 2016-09-26 MED ORDER — DEXAMETHASONE SODIUM PHOSPHATE 10 MG/ML IJ SOLN
INTRAMUSCULAR | Status: AC
  Filled 2016-09-26: qty 1

## 2016-09-26 MED ORDER — SUCCINYLCHOLINE CHLORIDE 200 MG/10ML IV SOSY
PREFILLED_SYRINGE | INTRAVENOUS | Status: AC
  Filled 2016-09-26: qty 10

## 2016-09-26 MED ORDER — MIDAZOLAM HCL 5 MG/5ML IJ SOLN
INTRAMUSCULAR | Status: DC | PRN
  Administered 2016-09-26: 2 mg via INTRAVENOUS

## 2016-09-26 MED ORDER — GABAPENTIN 300 MG PO CAPS
ORAL_CAPSULE | ORAL | Status: AC
  Filled 2016-09-26: qty 1

## 2016-09-26 MED ORDER — FENTANYL CITRATE (PF) 100 MCG/2ML IJ SOLN
INTRAMUSCULAR | Status: DC | PRN
  Administered 2016-09-26 (×2): 50 ug via INTRAVENOUS

## 2016-09-26 SURGICAL SUPPLY — 49 items
ADH SKN CLS APL DERMABOND .7 (GAUZE/BANDAGES/DRESSINGS)
APPLIER CLIP ROT 10 11.4 M/L (STAPLE)
APR CLP MED LRG 11.4X10 (STAPLE)
BAG SPEC RTRVL 10 TROC 200 (ENDOMECHANICALS)
BLADE SURG 11 STRL SS (BLADE) IMPLANT
CABLE HIGH FREQUENCY MONO STRZ (ELECTRODE) IMPLANT
CATH CHOLANG 76X19 KUMAR (CATHETERS) IMPLANT
CHLORAPREP W/TINT 26ML (MISCELLANEOUS) IMPLANT
CLIP APPLIE ROT 10 11.4 M/L (STAPLE) IMPLANT
CLIP LIGATING HEM O LOK PURPLE (MISCELLANEOUS) IMPLANT
CLIP LIGATING HEMO LOK XL GOLD (MISCELLANEOUS) IMPLANT
COVER BACK TABLE 60X90IN (DRAPES) IMPLANT
COVER MAYO STAND STRL (DRAPES) IMPLANT
DECANTER SPIKE VIAL GLASS SM (MISCELLANEOUS) IMPLANT
DERMABOND ADVANCED (GAUZE/BANDAGES/DRESSINGS)
DERMABOND ADVANCED .7 DNX12 (GAUZE/BANDAGES/DRESSINGS) IMPLANT
DEVICE PMI PUNCTURE CLOSURE (MISCELLANEOUS) IMPLANT
DRAIN CHANNEL 19F RND (DRAIN) IMPLANT
DRAPE C-ARM 42X72 X-RAY (DRAPES) IMPLANT
DRAPE LAPAROSCOPIC ABDOMINAL (DRAPES) IMPLANT
DRAPE UTILITY XL STRL (DRAPES) IMPLANT
ELECT REM PT RETURN 9FT ADLT (ELECTROSURGICAL)
ELECTRODE REM PT RTRN 9FT ADLT (ELECTROSURGICAL) IMPLANT
EVACUATOR SILICONE 100CC (DRAIN) IMPLANT
GLOVE BIOGEL PI IND STRL 7.0 (GLOVE) IMPLANT
GLOVE BIOGEL PI INDICATOR 7.0 (GLOVE)
GLOVE SURG SS PI 7.0 STRL IVOR (GLOVE) IMPLANT
GOWN STRL REUS W/TWL LRG LVL3 (GOWN DISPOSABLE) IMPLANT
HEMOSTAT SNOW SURGICEL 2X4 (HEMOSTASIS) IMPLANT
NEEDLE HYPO 22GX1.5 SAFETY (NEEDLE) IMPLANT
NS IRRIG 500ML POUR BTL (IV SOLUTION) IMPLANT
PACK BASIN DAY SURGERY FS (CUSTOM PROCEDURE TRAY) IMPLANT
POUCH RETRIEVAL ECOSAC 10 (ENDOMECHANICALS) IMPLANT
POUCH RETRIEVAL ECOSAC 10MM (ENDOMECHANICALS)
SCISSORS LAP 5X35 DISP (ENDOMECHANICALS) IMPLANT
SET IRRIG TUBING LAPAROSCOPIC (IRRIGATION / IRRIGATOR) IMPLANT
SHEARS HARMONIC ACE PLUS 36CM (ENDOMECHANICALS) IMPLANT
SOLUTION ANTI FOG 6CC (MISCELLANEOUS) IMPLANT
STOPCOCK 4 WAY LG BORE MALE ST (IV SETS) IMPLANT
SUT ETHILON 2 0 PS N (SUTURE) IMPLANT
SUT MNCRL AB 4-0 PS2 18 (SUTURE) IMPLANT
SUT VICRYL 0 ENDOLOOP (SUTURE) IMPLANT
SUT VICRYL 0 UR6 27IN ABS (SUTURE) IMPLANT
SYR 20CC LL (SYRINGE) IMPLANT
SYR CONTROL 10ML LL (SYRINGE) IMPLANT
TOWEL OR 17X24 6PK STRL BLUE (TOWEL DISPOSABLE) IMPLANT
TROCAR BLADELESS OPT 5 100 (ENDOMECHANICALS) IMPLANT
TROCAR XCEL 12X100 BLDLESS (ENDOMECHANICALS) IMPLANT
TUBING INSUF HEATED (TUBING) IMPLANT

## 2016-09-26 NOTE — Consult Note (Signed)
CONSULTATION NOTE   Patient Name: Andre Decker Date of Encounter: 09/26/2016 Cardiologist: none (new)  Hospital Problem List   Principal Problem:   Biliary colic Active Problems:   Bradycardia   HPI   Andre Decker is a 46 y.o. male who is being seen today for the evaluation of bradycardia at the request of Dr. Kieth Brightly. Mr. Chill is a was at 46 year old male who has a history of obstructive sleep apnea on CPAP, hypertension, IBS, a history of glaucoma and retinal detachment and a recent history of biliary colic. He presented to [facility] long outpatient surgical center today for elective cholecystectomy. On admission he was noted to be in sinus bradycardia with a heart rate in the 40s. A review of a prior EKG from 2016 indicated a sinus bradycardia with heart rate in the low 50s. According to the nursing staff and anesthesia, he underwent induction and was noted to have progressive bradycardia with heart rates that dropped into the 20s and apparently it was associated with a long sinus pause. The patient was given Robinul with improvement in heart rate. The case was canceled and after the procedure he was noted to be in a sinus rhythm with heart rate in the 70s by EKG. He reports being asymptomatic, denying any chest pain, worsening shortness of breath or other associated symptoms. He has no significant cardiovascular history. There is hypertension in the family.  PMHx   Past Medical History:  Diagnosis Date  . Biliary colic   . Glaucoma   . Glaucoma of both eyes   . Hypertension   . IBS (irritable bowel syndrome)    chronic  . IT band syndrome   . Liver hemangioma    dx 2002  . OSA on CPAP    moderate osa per study 05-21-2004  . Osteoarthritis   . Wears contact lenses     Past Surgical History:  Procedure Laterality Date  . CATARACT EXTRACTION W/ INTRAOCULAR LENS IMPLANT Left 01/18/2012  . COLONOSCOPY WITH ESOPHAGOGASTRODUODENOSCOPY (EGD)  last one 11-24-2012    . RETINAL DETACHMENT REPAIR W/ SCLERAL BUCKLE LE Bilateral 09/23/2009   Left eye and Laser for Retinal breaks Right eye  . UPPER ESOPHAGEAL ENDOSCOPIC ULTRASOUND (EUS)  2017    [facility]    FAMHx   Family History  Problem Relation Age of Onset  . Migraines Mother   . Prostate cancer Maternal Grandfather   . Prostate cancer Maternal Uncle   . Arthritis/Rheumatoid Maternal Grandmother   . Cancer Maternal Grandmother   . Arthritis/Rheumatoid Paternal Grandmother   . Stroke Paternal Grandfather   . Colon cancer Neg Hx     SOCHx    reports that he has never smoked. He has never used smokeless tobacco. He reports that he does not drink alcohol or use drugs.  Outpatient Medications   No current facility-administered medications on file prior to encounter.    Current Outpatient Prescriptions on File Prior to Encounter  Medication Sig Dispense Refill  . amLODipine (NORVASC) 5 MG tablet Take 5 mg by mouth every morning.     . AZOPT 1 % ophthalmic suspension Place 1 drop into both eyes 2 (two) times daily.    . bimatoprost (LUMIGAN) 0.01 % SOLN Place 1 drop into both eyes at bedtime.     . brimonidine-timolol (COMBIGAN) 0.2-0.5 % ophthalmic solution Place 1 drop into both eyes 2 (two) times daily.     . hydrochlorothiazide (HYDRODIURIL) 12.5 MG tablet Take 12.5 mg by mouth every  morning.     . valsartan (DIOVAN) 160 MG tablet Take 160 mg by mouth every morning.       Inpatient Medications    Scheduled Meds: . Chlorhexidine Gluconate Cloth  6 each Topical Once   And  . Chlorhexidine Gluconate Cloth  6 each Topical Once    Continuous Infusions: . cefoTEtan (CEFOTAN) IV    . lactated ringers Stopped (09/26/16 1400)    PRN Meds:  none  ALLERGIES   No Known Allergies  ROS   Pertinent items noted in HPI and remainder of comprehensive ROS otherwise negative.  Vitals   Vitals:   09/26/16 1245 09/26/16 1300 09/26/16 1315 09/26/16 1330  BP: 122/87 120/78 116/77 136/89   Pulse: (!) 50 (!) 46 (!) 46 (!) 55  Resp: 12 12 13 10   Temp:      TempSrc:      SpO2: 100% 99% 99% 100%  Weight:      Height:        Intake/Output Summary (Last 24 hours) at 09/26/16 1432 Last data filed at 09/26/16 1400  Gross per 24 hour  Intake             1040 ml  Output                0 ml  Net             1040 ml   Filed Weights   09/20/16 1319 09/26/16 0854  Weight: 268 lb (121.6 kg) 267 lb (121.1 kg)    Physical Exam   General appearance: alert and no distress Neck: no carotid bruit and no JVD Lungs: clear to auscultation bilaterally Heart: regular bradycardia Abdomen: soft, non-tender; bowel sounds normal; no masses,  no organomegaly Extremities: extremities normal, atraumatic, no cyanosis or edema Pulses: 2+ and symmetric Skin: Skin color, texture, turgor normal. No rashes or lesions Neurologic: Grossly normal psych: Pleasant  Labs   No results found for this or any previous visit (from the past 48 hour(s)).  ECG   Preoperative EKG demonstrated sinus bradycardia with first-degree AV block and a rate of 42, you waves are noted. - Personally Reviewed  Telemetry   Sinus bradycardia - Personally Reviewed  Radiology   No results found.  Cardiac Studies   none  Impression   Principal Problem:   Biliary colic Active Problems:   Bradycardia   Recommendation   1. Mr. Andre Decker had an episode of what is likely vaguely mediated bradycardia with sinus pause. He had marked improvement with Robinul (an anticholinergic). I suspect he has high vagal tone with a baseline bradycardia which was exacerbated by induction leading to his sinus pause. Although it's unlikely that there is underlying coronary disease it would be reasonable to have him undergo an exercise treadmill stress test as well as an echocardiogram to assure an absence of structural heart disease. I will have my office contact him to schedule these tests and plan to see him back in follow-up. He  is advised not to drive for work due to the bradycardia and reported to me that he is on FMLA leave currently. I suspect if he is workup is negative the could undergo another attempt at surgery in the near future, perhaps with an alternate means of anesthesia.  Thanks for the consultation.  Time Spent Directly with Patient:  40 minutes  Length of Stay:  LOS: 0 days   Pixie Casino, MD, [place]  Attending Cardiologist  Direct Dial: [phone]  Fax: [phone]  Website: [email or web address]   Nadean Corwin Cadyn Rodger 09/26/2016, 2:32 PM

## 2016-09-26 NOTE — Telephone Encounter (Signed)
New Message  Scheduled pt on 5.4.18 @ 330pm for NL. Can you call patient tomorrow to go over his prep prior to test?

## 2016-09-26 NOTE — Progress Notes (Signed)
Pt d/c'd to home via w/c.  Pt alert oriented x 4, VSS.  Pt denies pain, SOB,or n/v.  Pt aware to follow up w Dr. Debara Pickett as recommended.  Pt verbalized his understanding.

## 2016-09-26 NOTE — H&P (Signed)
Andre Decker is an 46 y.o. male.   Chief Complaint: abdominal  HPI: The patient has a long history of intermittent abdominal pain and on work up had findings concerning for biliary colic.  Past Medical History:  Diagnosis Date  . Biliary colic   . Glaucoma   . Glaucoma of both eyes   . Hypertension   . IBS (irritable bowel syndrome)    chronic  . IT band syndrome   . Liver hemangioma    dx 2002  . OSA on CPAP    moderate osa per study 05-21-2004  . Osteoarthritis   . Wears contact lenses     Past Surgical History:  Procedure Laterality Date  . CATARACT EXTRACTION W/ INTRAOCULAR LENS IMPLANT Left 01/18/2012  . COLONOSCOPY WITH ESOPHAGOGASTRODUODENOSCOPY (EGD)  last one 11-24-2012  . RETINAL DETACHMENT REPAIR W/ SCLERAL BUCKLE LE Bilateral 09/23/2009   Left eye and Laser for Retinal breaks Right eye  . UPPER ESOPHAGEAL ENDOSCOPIC ULTRASOUND (EUS)  2017    [facility]    Family History  Problem Relation Age of Onset  . Migraines Mother   . Prostate cancer Maternal Grandfather   . Prostate cancer Maternal Uncle   . Arthritis/Rheumatoid Maternal Grandmother   . Cancer Maternal Grandmother   . Arthritis/Rheumatoid Paternal Grandmother   . Stroke Paternal Grandfather   . Colon cancer Neg Hx    Social History:  reports that he has never smoked. He has never used smokeless tobacco. He reports that he does not drink alcohol or use drugs.  Allergies: No Known Allergies  Medications Prior to Admission  Medication Sig Dispense Refill  . amLODipine (NORVASC) 5 MG tablet Take 5 mg by mouth every morning.     . AZOPT 1 % ophthalmic suspension Place 1 drop into both eyes 2 (two) times daily.    . bimatoprost (LUMIGAN) 0.01 % SOLN Place 1 drop into both eyes at bedtime.     . brimonidine-timolol (COMBIGAN) 0.2-0.5 % ophthalmic solution Place 1 drop into both eyes 2 (two) times daily.     Marland Kitchen glycopyrrolate (ROBINUL) 2 MG tablet Take 2 mg by mouth 3 (three) times daily as needed.    .  hydrochlorothiazide (HYDRODIURIL) 12.5 MG tablet Take 12.5 mg by mouth every morning.     . valsartan (DIOVAN) 160 MG tablet Take 160 mg by mouth every morning.     Marland Kitchen HYDROcodone-acetaminophen (NORCO/VICODIN) 5-325 MG tablet Take 1 tablet by mouth every 6 (six) hours as needed for moderate pain.      No results found for this or any previous visit (from the past 48 hour(s)). No results found.  Review of Systems  Constitutional: Negative for chills and fever.  HENT: Negative for hearing loss.   Eyes: Negative for blurred vision and double vision.  Respiratory: Negative for cough and hemoptysis.   Cardiovascular: Negative for chest pain and palpitations.  Gastrointestinal: Positive for abdominal pain. Negative for nausea and vomiting.  Genitourinary: Negative for dysuria and urgency.  Musculoskeletal: Negative for myalgias and neck pain.  Skin: Negative for itching and rash.  Neurological: Negative for dizziness, tingling and headaches.  Endo/Heme/Allergies: Does not bruise/bleed easily.  Psychiatric/Behavioral: Negative for depression and suicidal ideas.    Blood pressure 133/77, pulse (!) 42, temperature 98.3 F (36.8 C), temperature source Oral, resp. rate 18, height 6' (1.829 m), weight 121.1 kg (267 lb), SpO2 100 %. Physical Exam  Vitals reviewed. Constitutional: He is oriented to person, place, and time. He appears well-developed and  well-nourished.  HENT:  Head: Normocephalic and atraumatic.  Eyes: Conjunctivae and EOM are normal. Pupils are equal, round, and reactive to light.  Neck: Normal range of motion. Neck supple.  Cardiovascular: Normal rate and regular rhythm.   Respiratory: Effort normal and breath sounds normal.  GI: Soft. Bowel sounds are normal. He exhibits no distension. There is no tenderness.  Musculoskeletal: Normal range of motion.  Neurological: He is alert and oriented to person, place, and time.  Skin: Skin is warm and dry.  Psychiatric: He has a normal  mood and affect. His behavior is normal.     Assessment/Plan 46 yo male with abdominal pain consistent with biliary colic -lap chole  Mickeal Skinner, MD 09/26/2016, 10:49 AM

## 2016-09-26 NOTE — Transfer of Care (Signed)
Immediate Anesthesia Transfer of Care Note  Patient: Brevan Luberto  Procedure(s) Performed: Procedure(s) (LRB): case cancellled per anesthesia (N/A)  Patient Location: PACU  Anesthesia Type: GA  Level of Consciousness: awake, alert  and oriented  Airway & Oxygen Therapy: Patient Spontanous Breathing and Patient connected to face mask oxygen  Post-op Assessment: Report given to PACU RN and Post -op Vital signs reviewed and stable  Post vital signs: Reviewed and stable  Complications: No apparent anesthesia complications, patient had long pause on EKG after pre-med given. Treated with IV robinul with good result. Case cancelled.  Last Vitals:  Vitals:   09/26/16 0854 09/26/16 1115  BP: 133/77 130/89  Pulse: (!) 42 81  Resp: 18 10  Temp: 36.8 C 36.8 C    Last Pain:  Vitals:   09/26/16 0854  TempSrc: Oral      Patients Stated Pain Goal: 7 (09/26/16 0919)

## 2016-09-26 NOTE — OR Nursing (Signed)
Case cancelled per anesthesia. 

## 2016-09-26 NOTE — Anesthesia Preprocedure Evaluation (Addendum)
Anesthesia Evaluation  Patient identified by MRN, date of birth, ID band Patient awake    Reviewed: Allergy & Precautions, NPO status , Patient's Chart, lab work & pertinent test results  Airway Mallampati: II  TM Distance: >3 FB     Dental   Pulmonary sleep apnea ,    breath sounds clear to auscultation       Cardiovascular hypertension,  Rhythm:Regular Rate:Normal     Neuro/Psych    GI/Hepatic Neg liver ROS, History noted. CG   Endo/Other  negative endocrine ROS  Renal/GU negative Renal ROS     Musculoskeletal  (+) Arthritis ,   Abdominal   Peds  Hematology   Anesthesia Other Findings   Reproductive/Obstetrics                             Anesthesia Physical Anesthesia Plan  ASA: III  Anesthesia Plan: General   Post-op Pain Management:    Induction: Intravenous  Airway Management Planned: Oral ETT  Additional Equipment:   Intra-op Plan:   Post-operative Plan: Extubation in OR  Informed Consent: I have reviewed the patients History and Physical, chart, labs and discussed the procedure including the risks, benefits and alternatives for the proposed anesthesia with the patient or authorized representative who has indicated his/her understanding and acceptance.   Dental advisory given  Plan Discussed with: CRNA and Anesthesiologist  Anesthesia Plan Comments:         Anesthesia Quick Evaluation

## 2016-09-27 ENCOUNTER — Encounter (HOSPITAL_COMMUNITY): Payer: Self-pay | Admitting: *Deleted

## 2016-09-27 ENCOUNTER — Telehealth (HOSPITAL_COMMUNITY): Payer: Self-pay | Admitting: Internal Medicine

## 2016-09-27 ENCOUNTER — Ambulatory Visit (HOSPITAL_COMMUNITY)
Admission: RE | Admit: 2016-09-27 | Discharge: 2016-09-27 | Disposition: A | Discharge status: Home / Self Care | Payer: 59 | Source: Ambulatory Visit | Attending: Cardiology | Admitting: Cardiology

## 2016-09-27 DIAGNOSIS — R001 Bradycardia, unspecified: Secondary | ICD-10-CM | POA: Insufficient documentation

## 2016-09-27 NOTE — Telephone Encounter (Signed)
Called pt and lmsg(per request of the patient) giving her his appt date and time for his Echocardiogram at [facility]. I gave him the address of [address] and the contact number of [phone].

## 2016-09-27 NOTE — Progress Notes (Signed)
Test reviewed by Dr. Stanford Breed . Given ok to leave.

## 2016-09-28 LAB — EXERCISE TOLERANCE TEST
CHL CUP MPHR: 176 {beats}/min
CSEPED: 9 min
CSEPHR: 104 %
CSEPPHR: 184 {beats}/min
Estimated workload: 11.3 METS
Exercise duration (sec): 45 s
RPE: 17
Rest HR: 49 {beats}/min

## 2016-10-02 ENCOUNTER — Ambulatory Visit (HOSPITAL_COMMUNITY)
Admission: RE | Admit: 2016-10-02 | Discharge: 2016-10-02 | Disposition: A | Discharge status: Home / Self Care | Payer: 59 | Source: Ambulatory Visit | Attending: Internal Medicine | Admitting: Internal Medicine

## 2016-10-02 DIAGNOSIS — G4730 Sleep apnea, unspecified: Secondary | ICD-10-CM | POA: Diagnosis not present

## 2016-10-02 DIAGNOSIS — I10 Essential (primary) hypertension: Secondary | ICD-10-CM | POA: Insufficient documentation

## 2016-10-02 DIAGNOSIS — R001 Bradycardia, unspecified: Secondary | ICD-10-CM | POA: Diagnosis not present

## 2016-10-02 NOTE — Progress Notes (Signed)
  Echocardiogram 2D Echocardiogram has been performed.  Andre Decker M 10/02/2016, 3:33 PM

## 2016-10-04 ENCOUNTER — Encounter: Payer: Self-pay | Admitting: Internal Medicine

## 2016-10-04 ENCOUNTER — Ambulatory Visit (INDEPENDENT_AMBULATORY_CARE_PROVIDER_SITE_OTHER): Payer: 59 | Admitting: Internal Medicine

## 2016-10-04 VITALS — BP 128/86 | HR 60 | Ht 72.0 in | Wt 269.4 lb

## 2016-10-04 DIAGNOSIS — I499 Cardiac arrhythmia, unspecified: Secondary | ICD-10-CM

## 2016-10-04 DIAGNOSIS — R001 Bradycardia, unspecified: Secondary | ICD-10-CM

## 2016-10-04 HISTORY — DX: Bradycardia, unspecified: R00.1

## 2016-10-04 NOTE — Patient Instructions (Signed)
Your physician recommends that you schedule a follow-up appointment as needed  

## 2016-10-04 NOTE — Progress Notes (Signed)
OFFICE FOLLOW-UP NOTE  Chief Complaint:  Follow-up studies  Primary Care Physician: Lilian Coma, MD  HPI:  Andre Decker is a 46 y.o. male recently seen by myself for bradycardia at the request of Dr. Kieth Brightly. Andre Decker was at the outpatient surgery center at [facility] long, about to undergo cholecystectomy. He was induced with by anesthesia and noted to become bradycardic with heart rate in the 40s and apparently ultimately dropped into the 20s at which time he was given Robinul which quickly reversed his bradycardia. The case was then canceled. I was consult said for management recommendations. There was no evidence of any chest pain or associated cardiac symptoms. He was completely asymptomatic after anesthesia wore off and wanted to go home. As part of a comprehensive workup I recommended an echocardiogram and exercise treadmill stress test. There is report performed in our office and I personally reviewed those studies. The stress test was negative for skin and his echocardiogram demonstrated an EF of 65-70% with normal wall motion and normal diastolic function.  PMHx:  Past Medical History:  Diagnosis Date  . Biliary colic   . Glaucoma   . Glaucoma of both eyes   . Hypertension   . IBS (irritable bowel syndrome)    chronic  . IT band syndrome   . Liver hemangioma    dx 2002  . OSA on CPAP    moderate osa per study 05-21-2004  . Osteoarthritis   . Wears contact lenses     Past Surgical History:  Procedure Laterality Date  . CATARACT EXTRACTION W/ INTRAOCULAR LENS IMPLANT Left 01/18/2012  . COLONOSCOPY WITH ESOPHAGOGASTRODUODENOSCOPY (EGD)  last one 11-24-2012  . RETINAL DETACHMENT REPAIR W/ SCLERAL BUCKLE LE Bilateral 09/23/2009   Left eye and Laser for Retinal breaks Right eye  . UPPER ESOPHAGEAL ENDOSCOPIC ULTRASOUND (EUS)  2017    [facility]    FAMHx:  Family History  Problem Relation Age of Onset  . Migraines Mother   . Prostate cancer Maternal Grandfather    . Prostate cancer Maternal Uncle   . Arthritis/Rheumatoid Maternal Grandmother   . Cancer Maternal Grandmother   . Arthritis/Rheumatoid Paternal Grandmother   . Stroke Paternal Grandfather   . Colon cancer Neg Hx     SOCHx:   reports that he has never smoked. He has never used smokeless tobacco. He reports that he does not drink alcohol or use drugs.  ALLERGIES:  No Known Allergies  ROS: Pertinent items noted in HPI and remainder of comprehensive ROS otherwise negative.  HOME MEDS: Current Outpatient Prescriptions on File Prior to Visit  Medication Sig Dispense Refill  . amLODipine (NORVASC) 5 MG tablet Take 5 mg by mouth every morning.     . AZOPT 1 % ophthalmic suspension Place 1 drop into both eyes 2 (two) times daily.    . bimatoprost (LUMIGAN) 0.01 % SOLN Place 1 drop into both eyes at bedtime.     . brimonidine-timolol (COMBIGAN) 0.2-0.5 % ophthalmic solution Place 1 drop into both eyes 2 (two) times daily.     Marland Kitchen glycopyrrolate (ROBINUL) 2 MG tablet Take 2 mg by mouth 3 (three) times daily as needed.    . hydrochlorothiazide (HYDRODIURIL) 12.5 MG tablet Take 12.5 mg by mouth every morning.     Marland Kitchen HYDROcodone-acetaminophen (NORCO/VICODIN) 5-325 MG tablet Take 1 tablet by mouth every 6 (six) hours as needed for moderate pain.    . valsartan (DIOVAN) 160 MG tablet Take 160 mg by mouth every morning.  No current facility-administered medications on file prior to visit.     LABS/IMAGING: No results found for this or any previous visit (from the past 48 hour(s)). No results found.  LIPID PANEL: No results found for: CHOL, TRIG, HDL, CHOLHDL, VLDL, LDLCALC, LDLDIRECT   WEIGHTS: Wt Readings from Last 3 Encounters:  10/04/16 269 lb 6.4 oz (122.2 kg)  09/26/16 267 lb (121.1 kg)  05/21/16 269 lb 4 oz (122.1 kg)    VITALS: BP 128/86   Pulse 60   Ht 6' (1.829 m)   Wt 269 lb 6.4 oz (122.2 kg)   BMI 36.54 kg/m   EXAM: General appearance: alert and no  distress Lungs: clear to auscultation bilaterally Heart: regular rate and rhythm, S1, S2 normal, no murmur, click, rub or gallop Neurologic: Grossly normal  EKG: Deferred  ASSESSMENT: 1. Vagally-mediated bradycardia  PLAN: 1.   [name] likely had vagally mediated bradycardia possibly as a result of anesthetic induction. He responded to an anti-cholinergic with marked improvement in heart rate. There were no signs or symptoms of ischemia and his stress test and echocardiogram did not support any underlying significant coronary artery disease. I would classify him as low risk to undergo another attempt at cholecystectomy. I cannot predict whether or not he would have another episode similar to this episode. Perhaps a different type of anesthetic selection may be helpful or empiric treatment with anti-cholinergics could be contemplated.  Follow-up with me as needed.  Pixie Casino, MD, [place]  Attending Cardiologist  Direct Dial: [phone]  Fax: [phone]  Website:  [email or web address] 10/04/2016, 12:57 PM

## 2016-10-05 ENCOUNTER — Inpatient Hospital Stay (HOSPITAL_COMMUNITY): Admission: RE | Admit: 2016-10-05 | Payer: Self-pay | Source: Ambulatory Visit

## 2016-10-15 ENCOUNTER — Ambulatory Visit: Payer: Self-pay | Admitting: Internal Medicine

## 2016-10-17 NOTE — Progress Notes (Signed)
Please place orders in EPIC as patient is being scheduled for a pre-op appointment! Thank you! 

## 2016-10-18 DIAGNOSIS — L218 Other seborrheic dermatitis: Secondary | ICD-10-CM | POA: Diagnosis not present

## 2016-10-18 NOTE — Progress Notes (Signed)
10-04-16 Surgical clearance by Dr. Debara Pickett in 10-04-16 office note 10-02-16 (EPIC) ECHO 09-27-16 (EPIC) Stress Test 09-26-16 (EPIC) EKG

## 2016-10-18 NOTE — Patient Instructions (Addendum)
Andre Decker  10/18/2016   Your procedure is scheduled on: 11-01-16  Report to [facility] Main Entrance Take [place]  Elevators to 3rd floor to [place] at 09:00 AM.    Call this number if you have problems the morning of surgery [phone]    Remember: ONLY 1 PERSON MAY GO WITH YOU TO SHORT STAY TO GET  READY MORNING OF [place].  Do not eat food or drink liquids :After Midnight.     Take these medicines the morning of surgery with A SIP OF WATER: Amlodipine (Norvasc). You may bring and use your eyedrops as needed.                                 You may not have any metal on your body including hair pins and              piercings  Do not wear jewelry, make-up, lotions, powders or perfumes, deodorant             Men may shave face and neck.   Do not bring valuables to the hospital. [place].  Contacts, dentures or bridgework may not be worn into surgery.                Please bring your mask and tubing for your CPAP machine                Please read over the following fact sheets you were given: _____________________________________________________________________             [facility] - Preparing for Surgery Before surgery, you can play an important role.  Because skin is not sterile, your skin needs to be as free of germs as possible.  You can reduce the number of germs on your skin by washing with CHG (chlorahexidine gluconate) soap before surgery.  CHG is an antiseptic cleaner which kills germs and bonds with the skin to continue killing germs even after washing. Please DO NOT use if you have an allergy to CHG or antibacterial soaps.  If your skin becomes reddened/irritated stop using the CHG and inform your nurse when you arrive at Short Stay. Do not shave (including legs and underarms) for at least 48 hours prior to the first CHG shower.  You may shave your face/neck. Please  follow these instructions carefully:  1.  Shower with CHG Soap the night before surgery and the  morning of Surgery.  2.  If you choose to wash your hair, wash your hair first as usual with your  normal  shampoo.  3.  After you shampoo, rinse your hair and body thoroughly to remove the  shampoo.                           4.  Use CHG as you would any other liquid soap.  You can apply chg directly  to the skin and wash                       Gently with a scrungie or clean washcloth.  5.  Apply the CHG Soap to your body ONLY FROM THE NECK DOWN.  Do not use on face/ open                           Wound or open sores. Avoid contact with eyes, ears mouth and genitals (private parts).                       Wash face,  Genitals (private parts) with your normal soap.             6.  Wash thoroughly, paying special attention to the area where your surgery  will be performed.  7.  Thoroughly rinse your body with warm water from the neck down.  8.  DO NOT shower/wash with your normal soap after using and rinsing off  the CHG Soap.                9.  Pat yourself dry with a clean towel.            10.  Wear clean pajamas.            11.  Place clean sheets on your bed the night of your first shower and do not  sleep with pets. Day of Surgery : Do not apply any lotions/deodorants the morning of surgery.  Please wear clean clothes to the hospital/surgery center.  FAILURE TO FOLLOW THESE INSTRUCTIONS MAY RESULT IN THE CANCELLATION OF YOUR SURGERY PATIENT SIGNATURE_________________________________  NURSE SIGNATURE__________________________________  ________________________________________________________________________

## 2016-10-18 NOTE — Progress Notes (Signed)
Please place orders in EPIC as patient has a pre-op appointment on 10/22/2016! Thank you!

## 2016-10-22 ENCOUNTER — Encounter (HOSPITAL_COMMUNITY): Payer: Self-pay

## 2016-10-22 ENCOUNTER — Encounter (HOSPITAL_COMMUNITY)
Admission: RE | Admit: 2016-10-22 | Discharge: 2016-10-22 | Disposition: A | Discharge status: Home / Self Care | Payer: 59 | Source: Ambulatory Visit | Attending: General Surgery | Admitting: General Surgery

## 2016-10-22 DIAGNOSIS — Z01812 Encounter for preprocedural laboratory examination: Secondary | ICD-10-CM | POA: Insufficient documentation

## 2016-10-22 LAB — CBC
HEMATOCRIT: 40.4 % (ref 39.0–52.0)
Hemoglobin: 13.7 g/dL (ref 13.0–17.0)
MCH: 27.5 pg (ref 26.0–34.0)
MCHC: 33.9 g/dL (ref 30.0–36.0)
MCV: 81 fL (ref 78.0–100.0)
Platelets: 192 10*3/uL (ref 150–400)
RBC: 4.99 MIL/uL (ref 4.22–5.81)
RDW: 13.6 % (ref 11.5–15.5)
WBC: 5.9 10*3/uL (ref 4.0–10.5)

## 2016-10-22 LAB — BASIC METABOLIC PANEL
Anion gap: 8 (ref 5–15)
BUN: 16 mg/dL (ref 6–20)
CO2: 27 mmol/L (ref 22–32)
CREATININE: 1.15 mg/dL (ref 0.61–1.24)
Calcium: 9.6 mg/dL (ref 8.9–10.3)
Chloride: 108 mmol/L (ref 101–111)
GFR calc Af Amer: >60 mL/min (ref >60)
Glucose, Bld: 102 mg/dL — ABNORMAL HIGH (ref 65–99)
POTASSIUM: 4.3 mmol/L (ref 3.5–5.1)
Sodium: 143 mmol/L (ref 135–145)

## 2016-10-22 LAB — NO BLOOD PRODUCTS

## 2016-10-22 NOTE — Progress Notes (Addendum)
Refusal of blood product consents faxed to the blood bank and [place] with confirmation of 'successful transmission'  on chart

## 2016-10-25 DIAGNOSIS — H26492 Other secondary cataract, left eye: Secondary | ICD-10-CM | POA: Diagnosis not present

## 2016-10-25 DIAGNOSIS — I10 Essential (primary) hypertension: Secondary | ICD-10-CM | POA: Diagnosis not present

## 2016-10-25 DIAGNOSIS — H401131 Primary open-angle glaucoma, bilateral, mild stage: Secondary | ICD-10-CM | POA: Diagnosis not present

## 2016-11-01 ENCOUNTER — Ambulatory Visit (HOSPITAL_COMMUNITY): Payer: 59 | Admitting: Anesthesiology

## 2016-11-01 ENCOUNTER — Encounter (HOSPITAL_COMMUNITY): Payer: Self-pay | Admitting: *Deleted

## 2016-11-01 ENCOUNTER — Ambulatory Visit (HOSPITAL_COMMUNITY)
Admission: RE | Admit: 2016-11-01 | Discharge: 2016-11-01 | Disposition: A | Discharge status: Home / Self Care | Payer: 59 | Source: Ambulatory Visit | Attending: General Surgery | Admitting: General Surgery

## 2016-11-01 ENCOUNTER — Ambulatory Visit (HOSPITAL_COMMUNITY): Payer: 59

## 2016-11-01 ENCOUNTER — Encounter (HOSPITAL_COMMUNITY)
Admission: RE | Disposition: A | Discharge status: Home / Self Care | Payer: Self-pay | Source: Ambulatory Visit | Attending: General Surgery

## 2016-11-01 DIAGNOSIS — I10 Essential (primary) hypertension: Secondary | ICD-10-CM | POA: Insufficient documentation

## 2016-11-01 DIAGNOSIS — Z8261 Family history of arthritis: Secondary | ICD-10-CM | POA: Diagnosis not present

## 2016-11-01 DIAGNOSIS — Z823 Family history of stroke: Secondary | ICD-10-CM | POA: Insufficient documentation

## 2016-11-01 DIAGNOSIS — K8020 Calculus of gallbladder without cholecystitis without obstruction: Secondary | ICD-10-CM | POA: Diagnosis not present

## 2016-11-01 DIAGNOSIS — E669 Obesity, unspecified: Secondary | ICD-10-CM | POA: Insufficient documentation

## 2016-11-01 DIAGNOSIS — K824 Cholesterolosis of gallbladder: Secondary | ICD-10-CM | POA: Diagnosis not present

## 2016-11-01 DIAGNOSIS — K8010 Calculus of gallbladder with chronic cholecystitis without obstruction: Secondary | ICD-10-CM | POA: Insufficient documentation

## 2016-11-01 DIAGNOSIS — Z8042 Family history of malignant neoplasm of prostate: Secondary | ICD-10-CM | POA: Diagnosis not present

## 2016-11-01 DIAGNOSIS — K811 Chronic cholecystitis: Secondary | ICD-10-CM | POA: Diagnosis not present

## 2016-11-01 DIAGNOSIS — G4730 Sleep apnea, unspecified: Secondary | ICD-10-CM | POA: Diagnosis not present

## 2016-11-01 DIAGNOSIS — Z809 Family history of malignant neoplasm, unspecified: Secondary | ICD-10-CM | POA: Insufficient documentation

## 2016-11-01 DIAGNOSIS — Z79899 Other long term (current) drug therapy: Secondary | ICD-10-CM | POA: Insufficient documentation

## 2016-11-01 DIAGNOSIS — Z419 Encounter for procedure for purposes other than remedying health state, unspecified: Secondary | ICD-10-CM

## 2016-11-01 DIAGNOSIS — Z6836 Body mass index (BMI) 36.0-36.9, adult: Secondary | ICD-10-CM | POA: Diagnosis not present

## 2016-11-01 DIAGNOSIS — G4733 Obstructive sleep apnea (adult) (pediatric): Secondary | ICD-10-CM | POA: Diagnosis not present

## 2016-11-01 DIAGNOSIS — K828 Other specified diseases of gallbladder: Secondary | ICD-10-CM | POA: Diagnosis not present

## 2016-11-01 HISTORY — PX: CHOLECYSTECTOMY: SHX55

## 2016-11-01 SURGERY — LAPAROSCOPIC CHOLECYSTECTOMY
Anesthesia: General | Site: Abdomen

## 2016-11-01 MED ORDER — HYDROCODONE-ACETAMINOPHEN 5-325 MG PO TABS: 1.0000 | ORAL_TABLET | Freq: Four times a day (QID) | ORAL | 0 refills | Status: DC | PRN

## 2016-11-01 MED ORDER — BUPIVACAINE-EPINEPHRINE 0.25% -1:200000 IJ SOLN
INTRAMUSCULAR | Status: DC | PRN
  Administered 2016-11-01: 20 mL

## 2016-11-01 MED ORDER — SUGAMMADEX SODIUM 200 MG/2ML IV SOLN
INTRAVENOUS | Status: DC | PRN
  Administered 2016-11-01: 250 mg via INTRAVENOUS

## 2016-11-01 MED ORDER — OXYCODONE HCL 5 MG/5ML PO SOLN: 5.0000 mg | Freq: Once | ORAL | Status: DC | PRN

## 2016-11-01 MED ORDER — PHENYLEPHRINE 40 MCG/ML (10ML) SYRINGE FOR IV PUSH (FOR BLOOD PRESSURE SUPPORT)
PREFILLED_SYRINGE | INTRAVENOUS | Status: DC | PRN
  Administered 2016-11-01 (×2): 40 ug via INTRAVENOUS

## 2016-11-01 MED ORDER — ONDANSETRON HCL 4 MG/2ML IJ SOLN
INTRAMUSCULAR | Status: AC
  Filled 2016-11-01: qty 2

## 2016-11-01 MED ORDER — ACETAMINOPHEN 500 MG PO TABS
1000.0000 mg | ORAL_TABLET | ORAL | Status: AC
  Administered 2016-11-01: 1000 mg via ORAL
  Filled 2016-11-01: qty 2

## 2016-11-01 MED ORDER — CHLORHEXIDINE GLUCONATE CLOTH 2 % EX PADS: 6.0000 | MEDICATED_PAD | Freq: Once | CUTANEOUS | Status: DC

## 2016-11-01 MED ORDER — FENTANYL CITRATE (PF) 100 MCG/2ML IJ SOLN
INTRAMUSCULAR | Status: DC
Start: 2016-11-01 — End: 2016-11-01
  Filled 2016-11-01: qty 4

## 2016-11-01 MED ORDER — PROPOFOL 10 MG/ML IV BOLUS
INTRAVENOUS | Status: AC
  Filled 2016-11-01: qty 40

## 2016-11-01 MED ORDER — SUCCINYLCHOLINE CHLORIDE 200 MG/10ML IV SOSY
PREFILLED_SYRINGE | INTRAVENOUS | Status: DC | PRN
  Administered 2016-11-01: 120 mg via INTRAVENOUS

## 2016-11-01 MED ORDER — ONDANSETRON HCL 4 MG/2ML IJ SOLN
INTRAMUSCULAR | Status: DC | PRN
  Administered 2016-11-01: 4 mg via INTRAVENOUS

## 2016-11-01 MED ORDER — GABAPENTIN 300 MG PO CAPS
300.0000 mg | ORAL_CAPSULE | ORAL | Status: AC
  Administered 2016-11-01: 300 mg via ORAL
  Filled 2016-11-01: qty 1

## 2016-11-01 MED ORDER — ROCURONIUM BROMIDE 50 MG/5ML IV SOSY
PREFILLED_SYRINGE | INTRAVENOUS | Status: AC
  Filled 2016-11-01: qty 5

## 2016-11-01 MED ORDER — HYDROMORPHONE HCL 1 MG/ML IJ SOLN
INTRAMUSCULAR | Status: AC
  Filled 2016-11-01: qty 1

## 2016-11-01 MED ORDER — GLYCOPYRROLATE 0.2 MG/ML IV SOSY
PREFILLED_SYRINGE | INTRAVENOUS | Status: AC
  Filled 2016-11-01: qty 5

## 2016-11-01 MED ORDER — CELECOXIB 200 MG PO CAPS
400.0000 mg | ORAL_CAPSULE | ORAL | Status: AC
  Administered 2016-11-01: 400 mg via ORAL
  Filled 2016-11-01: qty 2

## 2016-11-01 MED ORDER — SUGAMMADEX SODIUM 500 MG/5ML IV SOLN
INTRAVENOUS | Status: AC
  Filled 2016-11-01: qty 5

## 2016-11-01 MED ORDER — GLYCOPYRROLATE 0.2 MG/ML IV SOSY
PREFILLED_SYRINGE | INTRAVENOUS | Status: DC | PRN
  Administered 2016-11-01: .1 mg via INTRAVENOUS
  Administered 2016-11-01: .2 mg via INTRAVENOUS
  Administered 2016-11-01: .1 mg via INTRAVENOUS

## 2016-11-01 MED ORDER — LACTATED RINGERS IV SOLN
INTRAVENOUS | Status: DC
  Administered 2016-11-01: 10:00:00 via INTRAVENOUS

## 2016-11-01 MED ORDER — LIDOCAINE 2% (20 MG/ML) 5 ML SYRINGE
INTRAMUSCULAR | Status: DC | PRN
  Administered 2016-11-01: 100 mg via INTRAVENOUS

## 2016-11-01 MED ORDER — IBUPROFEN 800 MG PO TABS: 800.0000 mg | ORAL_TABLET | Freq: Three times a day (TID) | ORAL | 0 refills | Status: DC | PRN

## 2016-11-01 MED ORDER — ROCURONIUM BROMIDE 10 MG/ML (PF) SYRINGE
PREFILLED_SYRINGE | INTRAVENOUS | Status: DC | PRN
  Administered 2016-11-01: 10 mg via INTRAVENOUS
  Administered 2016-11-01: 50 mg via INTRAVENOUS

## 2016-11-01 MED ORDER — EPHEDRINE 5 MG/ML INJ
INTRAVENOUS | Status: AC
  Filled 2016-11-01: qty 10

## 2016-11-01 MED ORDER — PHENYLEPHRINE 40 MCG/ML (10ML) SYRINGE FOR IV PUSH (FOR BLOOD PRESSURE SUPPORT)
PREFILLED_SYRINGE | INTRAVENOUS | Status: AC
  Filled 2016-11-01: qty 10

## 2016-11-01 MED ORDER — FENTANYL CITRATE (PF) 100 MCG/2ML IJ SOLN
INTRAMUSCULAR | Status: DC | PRN
  Administered 2016-11-01 (×2): 50 ug via INTRAVENOUS

## 2016-11-01 MED ORDER — HYDROCODONE-ACETAMINOPHEN 5-325 MG PO TABS
1.0000 | ORAL_TABLET | Freq: Four times a day (QID) | ORAL | Status: DC | PRN
  Administered 2016-11-01: 1 via ORAL
  Filled 2016-11-01: qty 1

## 2016-11-01 MED ORDER — HYDROMORPHONE HCL 1 MG/ML IJ SOLN
0.2500 mg | INTRAMUSCULAR | Status: DC | PRN
  Administered 2016-11-01 (×2): 0.5 mg via INTRAVENOUS

## 2016-11-01 MED ORDER — SUCCINYLCHOLINE CHLORIDE 200 MG/10ML IV SOSY
PREFILLED_SYRINGE | INTRAVENOUS | Status: AC
  Filled 2016-11-01: qty 10

## 2016-11-01 MED ORDER — PROPOFOL 10 MG/ML IV BOLUS
INTRAVENOUS | Status: DC | PRN
  Administered 2016-11-01: 250 mg via INTRAVENOUS

## 2016-11-01 MED ORDER — IOPAMIDOL (ISOVUE-300) INJECTION 61%
INTRAVENOUS | Status: AC
  Filled 2016-11-01: qty 50

## 2016-11-01 MED ORDER — PROMETHAZINE HCL 25 MG/ML IJ SOLN: 6.2500 mg | INTRAMUSCULAR | Status: DC | PRN

## 2016-11-01 MED ORDER — ATROPINE SULFATE 1 MG/10ML IJ SOSY
PREFILLED_SYRINGE | INTRAMUSCULAR | Status: AC
  Filled 2016-11-01: qty 10

## 2016-11-01 MED ORDER — IOPAMIDOL (ISOVUE-300) INJECTION 61%
INTRAVENOUS | Status: DC | PRN
  Administered 2016-11-01: 20 mL

## 2016-11-01 MED ORDER — CEFAZOLIN SODIUM 10 G IJ SOLR
3.0000 g | INTRAMUSCULAR | Status: AC
  Administered 2016-11-01: 3 g via INTRAVENOUS
  Filled 2016-11-01: qty 3

## 2016-11-01 MED ORDER — MIDAZOLAM HCL 5 MG/5ML IJ SOLN
INTRAMUSCULAR | Status: DC | PRN
  Administered 2016-11-01: 2 mg via INTRAVENOUS

## 2016-11-01 MED ORDER — OXYCODONE HCL 5 MG PO TABS: 5.0000 mg | ORAL_TABLET | Freq: Once | ORAL | Status: DC | PRN

## 2016-11-01 MED ORDER — LIDOCAINE 2% (20 MG/ML) 5 ML SYRINGE
INTRAMUSCULAR | Status: AC
  Filled 2016-11-01: qty 5

## 2016-11-01 MED ORDER — FENTANYL CITRATE (PF) 100 MCG/2ML IJ SOLN
25.0000 ug | INTRAMUSCULAR | Status: DC | PRN
  Administered 2016-11-01 (×3): 50 ug via INTRAVENOUS

## 2016-11-01 MED ORDER — FENTANYL CITRATE (PF) 250 MCG/5ML IJ SOLN
INTRAMUSCULAR | Status: AC
  Filled 2016-11-01: qty 5

## 2016-11-01 MED ORDER — 0.9 % SODIUM CHLORIDE (POUR BTL) OPTIME
TOPICAL | Status: DC | PRN
  Administered 2016-11-01: 1000 mL

## 2016-11-01 MED ORDER — MIDAZOLAM HCL 2 MG/2ML IJ SOLN
INTRAMUSCULAR | Status: AC
  Filled 2016-11-01: qty 2

## 2016-11-01 MED ORDER — FENTANYL CITRATE (PF) 100 MCG/2ML IJ SOLN: 25.0000 ug | INTRAMUSCULAR | Status: DC | PRN

## 2016-11-01 MED ORDER — BUPIVACAINE-EPINEPHRINE (PF) 0.25% -1:200000 IJ SOLN
INTRAMUSCULAR | Status: AC
  Filled 2016-11-01: qty 30

## 2016-11-01 MED ORDER — EPHEDRINE SULFATE-NACL 50-0.9 MG/10ML-% IV SOSY
PREFILLED_SYRINGE | INTRAVENOUS | Status: DC | PRN
  Administered 2016-11-01: 5 mg via INTRAVENOUS
  Administered 2016-11-01: 10 mg via INTRAVENOUS
  Administered 2016-11-01: 5 mg via INTRAVENOUS

## 2016-11-01 SURGICAL SUPPLY — 46 items
APL SKNCLS STERI-STRIP NONHPOA (GAUZE/BANDAGES/DRESSINGS) ×1
APPLIER CLIP ROT 10 11.4 M/L (STAPLE)
APR CLP MED LRG 11.4X10 (STAPLE)
BAG SPEC RTRVL 10 TROC 200 (ENDOMECHANICALS) ×1
BANDAGE ADH SHEER 1  50/CT (GAUZE/BANDAGES/DRESSINGS) ×15 IMPLANT
BENZOIN TINCTURE PRP APPL 2/3 (GAUZE/BANDAGES/DRESSINGS) ×3 IMPLANT
BNDG ADH 5X4 AIR PERM ELC (GAUZE/BANDAGES/DRESSINGS) ×1
BNDG COHESIVE 4X5 WHT NS (GAUZE/BANDAGES/DRESSINGS) ×3 IMPLANT
CABLE HIGH FREQUENCY MONO STRZ (ELECTRODE) ×3 IMPLANT
CATH CHOLANG 76X19 KUMAR (CATHETERS) IMPLANT
CHLORAPREP W/TINT 26ML (MISCELLANEOUS) ×3 IMPLANT
CLIP APPLIE ROT 10 11.4 M/L (STAPLE) IMPLANT
CLIP LIGATING HEM O LOK PURPLE (MISCELLANEOUS) IMPLANT
CLIP LIGATING HEMO LOK XL GOLD (MISCELLANEOUS) IMPLANT
CLOSURE WOUND 1/2 X4 (GAUZE/BANDAGES/DRESSINGS) ×1
COVER MAYO STAND STRL (DRAPES) IMPLANT
COVER SURGICAL LIGHT HANDLE (MISCELLANEOUS) ×3 IMPLANT
DECANTER SPIKE VIAL GLASS SM (MISCELLANEOUS) ×3 IMPLANT
DRAIN CHANNEL 19F RND (DRAIN) IMPLANT
DRAPE C-ARM 42X120 X-RAY (DRAPES) IMPLANT
EVACUATOR SILICONE 100CC (DRAIN) IMPLANT
GLOVE BIOGEL PI IND STRL 7.0 (GLOVE) ×1 IMPLANT
GLOVE BIOGEL PI INDICATOR 7.0 (GLOVE) ×2
GLOVE SURG SS PI 7.0 STRL IVOR (GLOVE) ×3 IMPLANT
GOWN STRL REUS W/TWL LRG LVL3 (GOWN DISPOSABLE) ×3 IMPLANT
GOWN STRL REUS W/TWL XL LVL3 (GOWN DISPOSABLE) ×6 IMPLANT
GRASPER SUT TROCAR 14GX15 (MISCELLANEOUS) ×3 IMPLANT
IRRIG SUCT STRYKERFLOW 2 WTIP (MISCELLANEOUS) ×3
IRRIGATION SUCT STRKRFLW 2 WTP (MISCELLANEOUS) ×1 IMPLANT
KIT BASIN OR (CUSTOM PROCEDURE TRAY) ×3 IMPLANT
POUCH RETRIEVAL ECOSAC 10 (ENDOMECHANICALS) ×1 IMPLANT
POUCH RETRIEVAL ECOSAC 10MM (ENDOMECHANICALS) ×2
SCISSORS LAP 5X35 DISP (ENDOMECHANICALS) ×3 IMPLANT
SHEARS HARMONIC ACE PLUS 36CM (ENDOMECHANICALS) IMPLANT
SLEEVE XCEL OPT CAN 5 100 (ENDOMECHANICALS) ×6 IMPLANT
STOPCOCK 4 WAY LG BORE MALE ST (IV SETS) ×6 IMPLANT
STRIP CLOSURE SKIN 1/2X4 (GAUZE/BANDAGES/DRESSINGS) ×2 IMPLANT
SUT ETHILON 2 0 PS N (SUTURE) IMPLANT
SUT MNCRL AB 4-0 PS2 18 (SUTURE) ×3 IMPLANT
SUT VICRYL 0 ENDOLOOP (SUTURE) IMPLANT
TOWEL OR 17X26 10 PK STRL BLUE (TOWEL DISPOSABLE) ×3 IMPLANT
TOWEL OR NON WOVEN STRL DISP B (DISPOSABLE) ×3 IMPLANT
TRAY LAPAROSCOPIC (CUSTOM PROCEDURE TRAY) ×3 IMPLANT
TROCAR BLADELESS OPT 5 100 (ENDOMECHANICALS) ×3 IMPLANT
TROCAR XCEL 12X100 BLDLESS (ENDOMECHANICALS) ×3 IMPLANT
TUBING INSUF HEATED (TUBING) ×3 IMPLANT

## 2016-11-01 NOTE — Transfer of Care (Signed)
Immediate Anesthesia Transfer of Care Note  Patient: Andre Decker  Procedure(s) Performed: Procedure(s) with comments: LAPAROSCOPIC CHOLECYSTECTOMY (N/A) - WILL NEED ANESTHESIA CONSULT PRIOR TO SURGERY - SEE NOTE IN SPECIAL NEEDS SECTION  Patient Location: PACU  Anesthesia Type:General  Level of Consciousness: awake, alert , oriented and patient cooperative  Airway & Oxygen Therapy: Patient Spontanous Breathing and Patient connected to face mask oxygen  Post-op Assessment: Report given to RN, Post -op Vital signs reviewed and stable and Patient moving all extremities  Post vital signs: Reviewed and stable  Last Vitals:  Vitals:   11/01/16 0904  BP: (!) 149/87  Pulse: (!) 46  Resp: 20  Temp: 36.8 C    Last Pain:  Vitals:   11/01/16 0904  TempSrc: Oral      Patients Stated Pain Goal: 4 (87/21/58 7276)  Complications: No apparent anesthesia complications

## 2016-11-01 NOTE — Anesthesia Postprocedure Evaluation (Signed)
Anesthesia Post Note  Patient: Andre Decker  Procedure(s) Performed: Procedure(s) (LRB): LAPAROSCOPIC CHOLECYSTECTOMY (N/A)     Patient location during evaluation: PACU Anesthesia Type: General Level of consciousness: awake and alert Pain management: pain level controlled Vital Signs Assessment: post-procedure vital signs reviewed and stable Respiratory status: spontaneous breathing, nonlabored ventilation, respiratory function stable and patient connected to nasal cannula oxygen Cardiovascular status: blood pressure returned to baseline and stable Postop Assessment: no signs of nausea or vomiting Anesthetic complications: no    Last Vitals:  Vitals:   11/01/16 1618 11/01/16 1731  BP: 118/84 126/85  Pulse: (!) 56 (!) 59  Resp: 16 16  Temp: 37.2 C 36.6 C    Last Pain:  Vitals:   11/01/16 1731  TempSrc: Oral  PainSc: 3                  Jacqulyne Gladue P Roneisha Stern

## 2016-11-01 NOTE — Progress Notes (Signed)
Patient out of bed and up to bathroom s/p laparoscopic cholecystectomy. Voided large amount of amber colored urine. Sitting up in recliner chair. States he feels much better. Post op instructions reviewed with wife Rip Harbour.

## 2016-11-01 NOTE — H&P (Signed)
Andre Decker is an 46 y.o. male.   Chief Complaint: abdominal pain HPI: 46 yo male with right upper quadrant pain and HIDA concerning for biliary dyskinesia and finding of aberrant pancreatic duct anatomy. He presents today for cholecystectomy.  Past Medical History:  Diagnosis Date  . Biliary colic   . Glaucoma   . Glaucoma of both eyes   . Hypertension   . IBS (irritable bowel syndrome)    chronic  . IT band syndrome   . Liver hemangioma    dx 2002  . OSA on CPAP    moderate osa per study 05-21-2004  . Osteoarthritis   . Wears contact lenses     Past Surgical History:  Procedure Laterality Date  . CATARACT EXTRACTION W/ INTRAOCULAR LENS IMPLANT Left 01/18/2012  . COLONOSCOPY WITH ESOPHAGOGASTRODUODENOSCOPY (EGD)  last one 11-24-2012  . RETINAL DETACHMENT REPAIR W/ SCLERAL BUCKLE LE Bilateral 09/23/2009   Left eye and Laser for Retinal breaks Right eye  . UPPER ESOPHAGEAL ENDOSCOPIC ULTRASOUND (EUS)  2017    [facility]    Family History  Problem Relation Age of Onset  . Migraines Mother   . Prostate cancer Maternal Grandfather   . Prostate cancer Maternal Uncle   . Arthritis/Rheumatoid Maternal Grandmother   . Cancer Maternal Grandmother   . Arthritis/Rheumatoid Paternal Grandmother   . Stroke Paternal Grandfather   . Colon cancer Neg Hx    Social History:  reports that he has never smoked. He has never used smokeless tobacco. He reports that he does not drink alcohol or use drugs.  Allergies:  Allergies  Allergen Reactions  . Blood-Group Specific Substance     Pt is one of Jehov[employer]'s [employer]es    Medications Prior to Admission  Medication Sig Dispense Refill  . amLODipine (NORVASC) 5 MG tablet Take 5 mg by mouth daily.     . AZOPT 1 % ophthalmic suspension Place 1 drop into both eyes 2 (two) times daily.    . bimatoprost (LUMIGAN) 0.01 % SOLN Place 1 drop into both eyes at bedtime.     . brimonidine-timolol (COMBIGAN) 0.2-0.5 % ophthalmic solution Place 1 drop  into both eyes 2 (two) times daily.     Marland Kitchen desonide (DESOWEN) 0.05 % cream Apply 1 application topically daily as needed (rash).    . Fluocinolone Acetonide Scalp 0.01 % OIL Apply 1 application topically 2 (two) times a week.    . hydrochlorothiazide (HYDRODIURIL) 12.5 MG tablet Take 12.5 mg by mouth daily.     Marland Kitchen ketoconazole (NIZORAL) 2 % cream Apply 1 application topically daily.    Marland Kitchen ketoconazole (NIZORAL) 2 % shampoo Apply 1 application topically 2 (two) times a week.    . valsartan (DIOVAN) 160 MG tablet Take 160 mg by mouth daily.       No results found for this or any previous visit (from the past 48 hour(s)). No results found.  Review of Systems  Constitutional: Negative for chills and fever.  HENT: Negative for hearing loss.   Eyes: Negative for blurred vision and double vision.  Respiratory: Negative for cough and hemoptysis.   Cardiovascular: Negative for chest pain and palpitations.  Gastrointestinal: Positive for abdominal pain and nausea. Negative for vomiting.  Genitourinary: Negative for dysuria and urgency.  Musculoskeletal: Negative for myalgias and neck pain.  Skin: Negative for itching and rash.  Neurological: Negative for dizziness, tingling and headaches.  Endo/Heme/Allergies: Does not bruise/bleed easily.  Psychiatric/Behavioral: Negative for depression and suicidal ideas.  Blood pressure (!) 149/87, pulse (!) 46, temperature 98.2 F (36.8 C), temperature source Oral, resp. rate 20, height 6' (1.829 m), weight 122.9 kg (271 lb), SpO2 100 %. Physical Exam  Vitals reviewed. Constitutional: He is oriented to person, place, and time. He appears well-developed and well-nourished.  HENT:  Head: Normocephalic and atraumatic.  Eyes: Conjunctivae and EOM are normal. Pupils are equal, round, and reactive to light.  Neck: Normal range of motion. Neck supple.  Cardiovascular: Normal rate and regular rhythm.   Respiratory: Effort normal and breath sounds normal.  GI:  Soft. Bowel sounds are normal. He exhibits no distension. There is no tenderness.  Musculoskeletal: Normal range of motion.  Neurological: He is alert and oriented to person, place, and time.  Skin: Skin is warm and dry.  Psychiatric: He has a normal mood and affect. His behavior is normal.     Assessment/Plan 46 yo male with biliary dyskinesia presenting for laparoscopic cholecystectomy -laparoscopic cholecystectomy  Mickeal Skinner, MD 11/01/2016, 11:48 AM

## 2016-11-01 NOTE — Discharge Instructions (Signed)
General Anesthesia, Adult, Care After °These instructions provide you with information about caring for yourself after your procedure. Your health care provider may also give you more specific instructions. Your treatment has been planned according to current medical practices, but problems sometimes occur. Call your health care provider if you have any problems or questions after your procedure. °What can I expect after the procedure? °After the procedure, it is common to have: °· Vomiting. °· A sore throat. °· Mental slowness. ° °It is common to feel: °· Nauseous. °· Cold or shivery. °· Sleepy. °· Tired. °· Sore or achy, even in parts of your body where you did not have surgery. ° °Follow these instructions at home: °For at least 24 hours after the procedure: °· Do not: °? Participate in activities where you could fall or become injured. °? Drive. °? Use heavy machinery. °? Drink alcohol. °? Take sleeping pills or medicines that cause drowsiness. °? Make important decisions or sign legal documents. °? Take care of children on your own. °· Rest. °Eating and drinking °· If you vomit, drink water, juice, or soup when you can drink without vomiting. °· Drink enough fluid to keep your urine clear or pale yellow. °· Make sure you have little or no nausea before eating solid foods. °· Follow the diet recommended by your health care provider. °General instructions °· Have a responsible adult stay with you until you are awake and alert. °· Return to your normal activities as told by your health care provider. Ask your health care provider what activities are safe for you. °· Take over-the-counter and prescription medicines only as told by your health care provider. °· If you smoke, do not smoke without supervision. °· Keep all follow-up visits as told by your health care provider. This is important. °Contact a health care provider if: °· You continue to have nausea or vomiting at home, and medicines are not helpful. °· You  cannot drink fluids or start eating again. °· You cannot urinate after 8-12 hours. °· You develop a skin rash. °· You have fever. °· You have increasing redness at the site of your procedure. °Get help right away if: °· You have difficulty breathing. °· You have chest pain. °· You have unexpected bleeding. °· You feel that you are having a life-threatening or urgent problem. °This information is not intended to replace advice given to you by your health care provider. Make sure you discuss any questions you have with your health care provider. °Document Released: 08/27/2000 Document Revised: 10/24/2015 Document Reviewed: 05/05/2015 °Elsevier Interactive Patient Education © 2018 [employer] Inc. ° °

## 2016-11-01 NOTE — Op Note (Signed)
PATIENT:  Andre Decker  46 y.o. male  PRE-OPERATIVE DIAGNOSIS:  Biliary dyskinesia  POST-OPERATIVE DIAGNOSIS:  Biliary dyskinesia  PROCEDURE:  Procedure(s): LAPAROSCOPIC CHOLECYSTECTOMY   SURGEON:  Surgeon(s): Essie Lagunes, Arta Bruce, MD  ASSISTANT: Marcie Bal (student)  ANESTHESIA:   local and general  Indications for procedure: [name] is a 46 y.o. male with symptoms of Abdominal pain consistent with gallbladder disease, Confirmed by HIDA.  Description of procedure: The patient was brought into the operative suite, placed supine. Anesthesia was administered with endotracheal tube. Patient was strapped in place and foot board was secured. All pressure points were offloaded by foam padding. The patient was prepped and draped in the usual sterile fashion.  A small incision was made to the right of the umbilicus. A 64mm trocar was inserted into the peritoneal cavity with optical entry. Pneumoperitoneum was applied with high flow low pressure. 2 78mm trocars were placed in the RUQ. A 75mm trocar was placed in the subxiphoid space. All trocars sites were first anesthesized with 0.25% marcaine with epinephrine in the subcutaneous and preperitoneal layers. Next the patient was placed in reverse trendelenberg. The gallbladder had multiple adhesions to the dome and surrounding portion of the liver. These were taken down with blunt dissection.  The gallbladder was retracted cephalad and lateral. The peritoneum was reflected off the infundibulum working lateral to medial. The cystic duct and cystic artery were identified and further dissection revealed a critical view, due to concern for choledocholithiasis a cholangiogram was performed with [place]. Normal ductal anatomy was seen. The cystic duct and cystic artery were doubly clipped and ligated.   The gallbladder was removed off the liver bed with cautery. In this maneuver, the gallbladder was punctured and bile spilled. All  bile was removed with suctionThe Gallbladder was placed in a specimen bag. The gallbladder fossa was irrigated and hemostasis was applied with cautery. The gallbladder was removed via the 38mm trocar. No dilation was required for removal, therefore no fascial closure was performed. Pneumoperitoneum was removed, all trocar were removed. All incisions were closed with 4-0 monocryl subcuticular stitch. The patient woke from anesthesia and was brought to PACU in stable condition. All counts were correct  Findings: adhesions of the omentum to the gallbladder  Specimen: gallbladder  Blood loss: Total I/O In: 1000 [I.V.:1000] Out: 15 [Blood:15] ml  Local anesthesia: 20 ml 1.6% marcaine  Complications: none  PLAN OF CARE: Discharge to home after PACU  PATIENT DISPOSITION:  PACU - hemodynamically stable.  Gurney Maxin, M.D. General, Bariatric, & Minimally Invasive Surgery [facility]

## 2016-11-01 NOTE — Anesthesia Procedure Notes (Signed)
Procedure Name: Intubation Date/Time: 11/01/2016 12:10 PM Performed by: Carleene Cooper A Pre-anesthesia Checklist: Patient identified, Emergency Drugs available, Suction available, Patient being monitored and Timeout performed Patient Re-evaluated:Patient Re-evaluated prior to inductionOxygen Delivery Method: Circle system utilized Preoxygenation: Pre-oxygenation with 100% oxygen Intubation Type: IV induction Ventilation: Mask ventilation without difficulty Laryngoscope Size: Miller and 2 Grade View: Grade I Tube type: Oral Tube size: 7.5 mm Number of attempts: 2 Airway Equipment and Method: Stylet Placement Confirmation: ETT inserted through vocal cords under direct vision,  positive ETCO2 and breath sounds checked- equal and bilateral Secured at: 22 cm Tube secured with: Tape Dental Injury: Teeth and Oropharynx as per pre-operative assessment  Comments: Smooth IV induction. DL X1 by CRNA with MAC 4. Grade 2 view. - ETCO2. Easy mask. Sats 100%. DL X 1 by Dr. Roanna Banning with Mil 2. Grade 1 view. ATOI. +ETCO2  BBS=. ETT secured.

## 2016-11-01 NOTE — Anesthesia Preprocedure Evaluation (Addendum)
Anesthesia Evaluation  Patient identified by MRN, date of birth, ID band Patient awake    Reviewed: Allergy & Precautions, NPO status , Patient's Chart, lab work & pertinent test results  Airway Mallampati: III  TM Distance: >3 FB Neck ROM: Full    Dental no notable dental hx.    Pulmonary sleep apnea and Continuous Positive Airway Pressure Ventilation ,    Pulmonary exam normal breath sounds clear to auscultation       Cardiovascular hypertension, Pt. on medications Normal cardiovascular exam Rhythm:Regular Rate:Normal  Low risk stress test - no ischemia.  Dr. Lemmie Evens  ECG: NSR, rate 74  ECHO: - Left ventricle: The cavity size was normal. Wall thickness was normal. Systolic function was vigorous. The estimated ejection fraction was in the range of 65% to 70%. Wall motion was normal; there were no regional wall motion abnormalities.    Neuro/Psych negative neurological ROS  negative psych ROS   GI/Hepatic Neg liver ROS, IBS   Endo/Other  negative endocrine ROS  Renal/GU negative Renal ROS  negative genitourinary   Musculoskeletal negative musculoskeletal ROS (+)   Abdominal   Peds negative pediatric ROS (+)  Hematology negative hematology ROS (+)   Anesthesia Other Findings Obese, BMI 36  Reproductive/Obstetrics negative OB ROS                            Anesthesia Physical Anesthesia Plan  ASA: III  Anesthesia Plan: General   Post-op Pain Management:    Induction: Intravenous  Airway Management Planned: Oral ETT  Additional Equipment:   Intra-op Plan:   Post-operative Plan: Extubation in OR  Informed Consent: I have reviewed the patients History and Physical, chart, labs and discussed the procedure including the risks, benefits and alternatives for the proposed anesthesia with the patient or authorized representative who has indicated his/her understanding and acceptance.    Dental advisory given  Plan Discussed with: CRNA  Anesthesia Plan Comments:        Anesthesia Quick Evaluation

## 2016-11-02 ENCOUNTER — Encounter (HOSPITAL_COMMUNITY): Payer: Self-pay | Admitting: General Surgery

## 2016-11-06 ENCOUNTER — Telehealth: Payer: Self-pay | Admitting: Neurology

## 2016-11-06 DIAGNOSIS — M170 Bilateral primary osteoarthritis of knee: Secondary | ICD-10-CM | POA: Diagnosis not present

## 2016-11-13 DIAGNOSIS — M170 Bilateral primary osteoarthritis of knee: Secondary | ICD-10-CM | POA: Diagnosis not present

## 2016-11-20 DIAGNOSIS — M170 Bilateral primary osteoarthritis of knee: Secondary | ICD-10-CM | POA: Diagnosis not present

## 2016-11-22 ENCOUNTER — Encounter: Payer: Self-pay | Admitting: Neurology

## 2016-11-22 ENCOUNTER — Ambulatory Visit (INDEPENDENT_AMBULATORY_CARE_PROVIDER_SITE_OTHER): Payer: 59 | Admitting: Neurology

## 2016-11-22 VITALS — BP 124/89 | HR 49 | Ht 72.0 in | Wt 276.5 lb

## 2016-11-22 DIAGNOSIS — R202 Paresthesia of skin: Secondary | ICD-10-CM | POA: Diagnosis not present

## 2016-11-22 NOTE — Progress Notes (Signed)
Reason for visit: Left foot numbness  Andre Decker is an 46 y.o. male  History of present illness:  Andre Decker is a 46 year old left-handed black male with a history of left foot numbness that he perceives is mainly on the sole of the foot. The patient was seen and evaluated in 2016, EMG and nerve conduction studies were done at that time and were unremarkable. The patient has had persistence of symptoms, he does not believe that there has been much change in his sensory complaints. He denies any pain in the foot or in the back or down the leg. He does not perceive any numbness of the right foot. He has not had any problems with the neck, arms, or hands. He denies any balance issues or difficulty controlling the bowels or the bladder. He is sleeping well at night, the numbness does not keep him awake. He returns for reevaluation.  Past Medical History:  Diagnosis Date  . Biliary colic   . Glaucoma   . Glaucoma of both eyes   . Hypertension   . IBS (irritable bowel syndrome)    chronic  . IT band syndrome   . Liver hemangioma    dx 2002  . OSA on CPAP    moderate osa per study 05-21-2004  . Osteoarthritis   . Wears contact lenses     Past Surgical History:  Procedure Laterality Date  . CATARACT EXTRACTION W/ INTRAOCULAR LENS IMPLANT Left 01/18/2012  . CHOLECYSTECTOMY N/A 11/01/2016   Procedure: LAPAROSCOPIC CHOLECYSTECTOMY;  Surgeon: Kinsinger, Arta Bruce, MD;  Location: [facility];  Service: General;  Laterality: N/A;  WILL NEED ANESTHESIA CONSULT PRIOR TO SURGERY - SEE NOTE IN SPECIAL NEEDS SECTION  . COLONOSCOPY WITH ESOPHAGOGASTRODUODENOSCOPY (EGD)  last one 11-24-2012  . RETINAL DETACHMENT REPAIR W/ SCLERAL BUCKLE LE Bilateral 09/23/2009   Left eye and Laser for Retinal breaks Right eye  . UPPER ESOPHAGEAL ENDOSCOPIC ULTRASOUND (EUS)  2017    [facility]    Family History  Problem Relation Age of Onset  . Migraines Mother   . Prostate cancer Maternal Grandfather   .  Prostate cancer Maternal Uncle   . Arthritis/Rheumatoid Maternal Grandmother   . Cancer Maternal Grandmother   . Arthritis/Rheumatoid Paternal Grandmother   . Stroke Paternal Grandfather   . Colon cancer Neg Hx     Social history:  reports that he has never smoked. He has never used smokeless tobacco. He reports that he does not drink alcohol or use drugs.    Allergies  Allergen Reactions  . Blood-Group Specific Substance     Pt is one of Jehovah's Witnesses    Medications:  Prior to Admission medications   Medication Sig Start Date End Date Taking? Authorizing Provider  amLODipine (NORVASC) 5 MG tablet Take 5 mg by mouth daily.  07/27/15  Yes [provider]  AZOPT 1 % ophthalmic suspension Place 1 drop into both eyes 2 (two) times daily. 05/27/14  Yes [provider]  bimatoprost (LUMIGAN) 0.01 % SOLN Place 1 drop into both eyes at bedtime.    Yes [provider]  brimonidine-timolol (COMBIGAN) 0.2-0.5 % ophthalmic solution Place 1 drop into both eyes 2 (two) times daily.    Yes [provider]  desonide (DESOWEN) 0.05 % cream Apply 1 application topically daily as needed (rash).   Yes [provider]  Fluocinolone Acetonide Scalp 0.01 % OIL Apply 1 application topically 2 (two) times a week.   Yes [provider]  hydrochlorothiazide (HYDRODIURIL) 12.5 MG tablet Take 12.5 mg by mouth daily.  07/27/15  Yes [provider]  HYDROcodone-acetaminophen (NORCO/VICODIN) 5-325 MG tablet Take 1-2 tablets by mouth every 6 (six) hours as needed for moderate pain. 11/01/16  Yes Kinsinger, Arta Bruce, MD  ibuprofen (ADVIL,MOTRIN) 800 MG tablet Take 1 tablet (800 mg total) by mouth every 8 (eight) hours as needed. 11/01/16  Yes Kinsinger, Arta Bruce, MD  ketoconazole (NIZORAL) 2 % cream Apply 1 application topically daily.   Yes [provider]  ketoconazole (NIZORAL) 2 % shampoo Apply 1 application topically 2 (two) times a week.    Yes [provider]  valsartan (DIOVAN) 160 MG tablet Take 160 mg by mouth daily.  07/27/15  Yes [provider]    ROS:  Out of a complete 14 system review of symptoms, the patient complains only of the following symptoms, and all other reviewed systems are negative.  Left foot numbness  Blood pressure 124/89, pulse (!) 49, height 6' (1.829 m), weight 276 lb 8 oz (125.4 kg).  Physical Exam  General: The patient is alert and cooperative at the time of the examination.  Neuromuscular: Range of movement of the low back is full.  Skin: No significant peripheral edema is noted.   Neurologic Exam  Mental status: The patient is alert and oriented x 3 at the time of the examination. The patient has apparent normal recent and remote memory, with an apparently normal attention span and concentration ability.   Cranial nerves: Facial symmetry is present. Speech is normal, no aphasia or dysarthria is noted. Extraocular movements are full. Visual fields are full.  Motor: The patient has good strength in all 4 extremities.  Sensory examination: Soft touch sensation is symmetric on the face, arms, and legs. Pinprick sensation shows some sensory deficits across the ankles bilaterally. Otherwise pinprick sensation is symmetric throughout.  Coordination: The patient has good finger-nose-finger and heel-to-shin bilaterally.  Gait and station: The patient has a normal gait. Tandem gait is normal. Romberg is negative. No drift is seen. The patient is able to walk on heels and the toes bilaterally.  Reflexes: Deep tendon reflexes are symmetric, reflexes are normal throughout, ankle jerk reflexes are well-maintained.   Assessment/Plan:  1. Left foot numbness  Clinical examination appears to show a slight sensory change to pinprick across the ankles bilaterally. The patient will be set up for blood work today looking for metabolic etiologies of his sensory abnormalities. He  appears to be clinically stable over the last 2 years. He reports no back pain or leg discomfort.    Jill Alexanders MD 11/22/2016 12:44 PM  [facility] [address] [place], [place] [address]  Phone [phone] Fax [phone]

## 2016-11-23 LAB — B. BURGDORFI ANTIBODIES: Lyme IgG/IgM Ab: <0.91 {ISR} (ref 0.00–0.90)

## 2016-11-23 LAB — ANGIOTENSIN CONVERTING ENZYME: ANGIO CONVERT ENZYME: 16 U/L (ref 14–82)

## 2016-11-23 LAB — HEMOGLOBIN A1C
Est. average glucose Bld gHb Est-mCnc: 128 mg/dL
HEMOGLOBIN A1C: 6.1 % — AB (ref 4.8–5.6)

## 2016-11-23 LAB — ANA W/REFLEX: ANA: NEGATIVE

## 2016-11-23 LAB — SEDIMENTATION RATE: Sed Rate: 10 mm/hr (ref 0–15)

## 2016-11-23 LAB — VITAMIN B12: Vitamin B-12: 406 pg/mL (ref 232–1245)

## 2016-12-17 NOTE — Anesthesia Postprocedure Evaluation (Signed)
Anesthesia Post Note  Patient: Andre Decker  Procedure(s) Performed: Procedure(s) (LRB): case cancellled per anesthesia (N/A)     Anesthesia Post Evaluation  Last Vitals:  Vitals:   09/26/16 1315 09/26/16 1330  BP: 116/77 136/89  Pulse: (!) 46 (!) 55  Resp: 13 10  Temp:      Last Pain:  Vitals:   09/27/16 1410  TempSrc:   PainSc: 0-No pain                 Talvin Christianson

## 2017-01-14 DIAGNOSIS — R7303 Prediabetes: Secondary | ICD-10-CM | POA: Diagnosis not present

## 2017-01-14 DIAGNOSIS — I10 Essential (primary) hypertension: Secondary | ICD-10-CM | POA: Diagnosis not present

## 2017-01-22 DIAGNOSIS — M1711 Unilateral primary osteoarthritis, right knee: Secondary | ICD-10-CM | POA: Diagnosis not present

## 2017-01-22 DIAGNOSIS — M1712 Unilateral primary osteoarthritis, left knee: Secondary | ICD-10-CM | POA: Diagnosis not present

## 2017-01-24 ENCOUNTER — Encounter: Payer: Self-pay | Admitting: Internal Medicine

## 2017-01-24 ENCOUNTER — Other Ambulatory Visit (INDEPENDENT_AMBULATORY_CARE_PROVIDER_SITE_OTHER): Payer: 59

## 2017-01-24 ENCOUNTER — Telehealth: Payer: Self-pay | Admitting: *Deleted

## 2017-01-24 ENCOUNTER — Ambulatory Visit (INDEPENDENT_AMBULATORY_CARE_PROVIDER_SITE_OTHER): Payer: 59 | Admitting: Internal Medicine

## 2017-01-24 VITALS — BP 108/70 | HR 70 | Ht 71.65 in | Wt 269.6 lb

## 2017-01-24 DIAGNOSIS — K834 Spasm of sphincter of Oddi: Secondary | ICD-10-CM

## 2017-01-24 DIAGNOSIS — R109 Unspecified abdominal pain: Secondary | ICD-10-CM

## 2017-01-24 LAB — COMPREHENSIVE METABOLIC PANEL
ALBUMIN: 4.6 g/dL (ref 3.5–5.2)
ALT: 33 U/L (ref 0–53)
AST: 28 U/L (ref 0–37)
Alkaline Phosphatase: 41 U/L (ref 39–117)
BUN: 12 mg/dL (ref 6–23)
CALCIUM: 10.2 mg/dL (ref 8.4–10.5)
CHLORIDE: 104 meq/L (ref 96–112)
CO2: 32 mEq/L (ref 19–32)
Creatinine, Ser: 1.26 mg/dL (ref 0.40–1.50)
GFR: 79.65 mL/min (ref >60.00)
Glucose, Bld: 70 mg/dL (ref 70–99)
POTASSIUM: 3.7 meq/L (ref 3.5–5.1)
SODIUM: 143 meq/L (ref 135–145)
Total Bilirubin: 1.3 mg/dL — ABNORMAL HIGH (ref 0.2–1.2)
Total Protein: 7.6 g/dL (ref 6.0–8.3)

## 2017-01-24 LAB — LIPASE: Lipase: 11 U/L (ref 11.0–59.0)

## 2017-01-24 MED ORDER — HYDROCODONE-ACETAMINOPHEN 5-325 MG PO TABS: 1.0000 | ORAL_TABLET | Freq: Four times a day (QID) | ORAL | 0 refills | Status: DC | PRN

## 2017-01-24 NOTE — Progress Notes (Signed)
Subjective:    Patient ID: Andre Decker, male    DOB: 1970/06/10, 46 y.o.   MRN: [id number]  HPI Andre Decker is a 46 year old male with a history of recurrent episodic right upper quadrant abdominal pain, history of IBS, obesity and hypertension who is here for follow-up. He was last seen in the office in December 2017 by Nicoletta Ba, PA-C. He has been evaluated extensively for episodic right upper quadrant pain with associated mild bump in liver function test, usually mild bumps and total bilirubin. He has previously had a negative CT scan of the abdomen and pelvis, negative CCK HIDA scan. He underwent EUS at [facility] with Dr. Mont Dutton which was unremarkable. 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  He went for cholecystectomy in May for these recurrent episodes of right upper quadrant pain. Unfortunately this did not help these attacks. He reports that he's had multiple subsequent right upper quadrant and right middle quadrant abdominal pain episodes. This is a cramping spastic type pain which seems to build and then slowly improved. He had 4 episodes in June on June 7, 9, 10th, and 15th, then again on July 20, August 9, August 20 and today. These attacks can be associated with mild nausea but no vomiting. The pain does not radiate. He's been trying to avoid fatty and fried foods. He has been off of narcotics since several days after surgery. He previously tried Robinul Forte with no help. Bowel movements have been regular.  Prior EGD and colonoscopy were unremarkable  Review of Systems As per history of present illness, otherwise negative  Current Medications, Allergies, Past Medical History, Past Surgical History, Family History and Social History were reviewed in [employer] record.     Objective:   Physical Exam BP 108/70   Pulse 70   Ht 5' 11.65" (1.82 m) Comment: w/o shoes  Wt 269 lb 9.6 oz (122.3 kg)   BMI 36.92 kg/m  Constitutional:  Well-developed and well-nourished. No distress. HEENT: Normocephalic and atraumatic. Oropharynx is clear and moist. Conjunctivae are normal.  No scleral icterus. Neck: Neck supple. Trachea midline. Cardiovascular: Normal rate, regular rhythm and intact distal pulses. No M/R/G Pulmonary/chest: Effort normal and breath sounds normal. No wheezing, rales or rhonchi. Abdominal: Soft, There is right upper quadrant tenderness without rebound or guarding, obese,  nondistended. Bowel sounds active throughout. There are no masses palpable. No hepatosplenomegaly. Extremities: no clubbing, cyanosis, or edema Neurological: Alert and oriented to person place and time. Skin: Skin is warm and dry. Psychiatric: Normal mood and affect. Behavior is normal.  CBC    Component Value Date/Time   WBC 5.9 10/22/2016 1102   RBC 4.99 10/22/2016 1102   HGB 13.7 10/22/2016 1102   HCT 40.4 10/22/2016 1102   PLT 192 10/22/2016 1102   MCV 81.0 10/22/2016 1102   MCH 27.5 10/22/2016 1102   MCHC 33.9 10/22/2016 1102   RDW 13.6 10/22/2016 1102   LYMPHSABS 2.0 09/24/2016 1049   MONOABS 0.3 09/24/2016 1049   EOSABS 0.0 09/24/2016 1049   BASOSABS 0.0 09/24/2016 1049   CMP     Component Value Date/Time   NA 143 01/24/2017 1136   K 3.7 01/24/2017 1136   CL 104 01/24/2017 1136   CO2 32 01/24/2017 1136   GLUCOSE 70 01/24/2017 1136   BUN 12 01/24/2017 1136   CREATININE 1.26 01/24/2017 1136   CALCIUM 10.2 01/24/2017 1136   PROT 7.6 01/24/2017 1136   ALBUMIN 4.6 01/24/2017 1136   AST  28 01/24/2017 1136   ALT 33 01/24/2017 1136   ALKPHOS 41 01/24/2017 1136   BILITOT 1.3 (H) 01/24/2017 1136   GFRNONAA >60 10/22/2016 1102   GFRAA >60 10/22/2016 1102   Results for JOUSHUA, DUGAR (MRN [id number]) as of 01/24/2017 17:42  Ref. Range 06/17/2014 08:35 06/17/2014 09:04 10/05/2014 09:35 08/24/2015 20:02 11/02/2015 09:27 05/10/2016 11:01 09/24/2016 10:49 10/22/2016 11:02 01/24/2017 11:36  Total Bilirubin Latest Ref Range: 0.2 -  1.2 mg/dL 0.7  1.0 1.8 (H) 1.3 (H) 1.6 (H) 1.1  1.3 (H)    Lipase     Component Value Date/Time   LIPASE 11.0 01/24/2017 1136       Assessment & Plan:   46 year old male with a history of recurrent episodic right upper quadrant abdominal pain, history of IBS, obesity and hypertension who is here for follow-up.  1. Episodic right upper quadrant pain -- unfortunately no relief from cholecystectomy. 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 I will repeat CMP and lipase today. I'm going to ask him to see Dr. Mont Dutton at [facility] again to consider biliary sphincterotomy versus biliary manometry. We'll give him a prescription for Vicodin 5 mg 1-2 tablets every 6 hours as needed for moderate to severe pain. He knows to not use these while he is driving or working, and I reminded him of this again today.  25 minutes spent with the patient today. Greater than 50% was spent in counseling and coordination of care with the patient

## 2017-01-24 NOTE — Telephone Encounter (Signed)
Patient has been scheduled to see Dr Tillie Rung at [facility] for Sphincter of Oddi dysfunction on 03/11/17 at 1:45 pm, [facility] clinic, [address], 2nd floor. I have sent records to [facility] at fax [phone]. I have also spoken to patient to advise him of this information and have left a voicemail with information at his request. He verbalizes understanding of appointment time/date/location.

## 2017-01-24 NOTE — Patient Instructions (Signed)
Your physician has requested that you go to the basement for the following lab work before leaving today: CMP,LIPASE  We have given you a prescription for Vicodin to take to your pharmacy.  We will refer you back to Dr Mont Dutton at [facility] for Sphincter of Oddi dysfunction. We will contact you with an appointment once we have been given a date/time.  If you are age 45 or older, your body mass index should be between 23-30. Your Body mass index is 36.92 kg/m. If this is out of the aforementioned range listed, please consider follow up with your Primary Care Provider.  If you are age 67 or younger, your body mass index should be between 19-25. Your Body mass index is 36.92 kg/m. If this is out of the aformentioned range listed, please consider follow up with your Primary Care Provider.

## 2017-03-08 NOTE — Telephone Encounter (Signed)
ERROR

## 2017-03-11 DIAGNOSIS — R1084 Generalized abdominal pain: Secondary | ICD-10-CM | POA: Diagnosis not present

## 2017-03-11 DIAGNOSIS — Q453 Other congenital malformations of pancreas and pancreatic duct: Secondary | ICD-10-CM | POA: Diagnosis not present

## 2017-03-11 DIAGNOSIS — R945 Abnormal results of liver function studies: Secondary | ICD-10-CM | POA: Diagnosis not present

## 2017-03-12 DIAGNOSIS — Z9049 Acquired absence of other specified parts of digestive tract: Secondary | ICD-10-CM

## 2017-03-12 HISTORY — DX: Acquired absence of other specified parts of digestive tract: Z90.49

## 2017-03-27 DIAGNOSIS — K760 Fatty (change of) liver, not elsewhere classified: Secondary | ICD-10-CM | POA: Diagnosis not present

## 2017-03-27 DIAGNOSIS — R1084 Generalized abdominal pain: Secondary | ICD-10-CM | POA: Diagnosis not present

## 2017-03-27 DIAGNOSIS — R945 Abnormal results of liver function studies: Secondary | ICD-10-CM | POA: Diagnosis not present

## 2017-04-22 DIAGNOSIS — H401133 Primary open-angle glaucoma, bilateral, severe stage: Secondary | ICD-10-CM | POA: Diagnosis not present

## 2017-05-22 ENCOUNTER — Ambulatory Visit (INDEPENDENT_AMBULATORY_CARE_PROVIDER_SITE_OTHER): Payer: 59 | Admitting: Ophthalmology

## 2017-06-18 DIAGNOSIS — Z9989 Dependence on other enabling machines and devices: Secondary | ICD-10-CM | POA: Diagnosis not present

## 2017-06-18 DIAGNOSIS — K760 Fatty (change of) liver, not elsewhere classified: Secondary | ICD-10-CM | POA: Diagnosis not present

## 2017-06-18 DIAGNOSIS — R7302 Impaired glucose tolerance (oral): Secondary | ICD-10-CM | POA: Diagnosis not present

## 2017-06-18 DIAGNOSIS — G4733 Obstructive sleep apnea (adult) (pediatric): Secondary | ICD-10-CM | POA: Diagnosis not present

## 2017-06-26 ENCOUNTER — Encounter (INDEPENDENT_AMBULATORY_CARE_PROVIDER_SITE_OTHER): Payer: 59 | Admitting: Ophthalmology

## 2017-06-26 DIAGNOSIS — H2511 Age-related nuclear cataract, right eye: Secondary | ICD-10-CM | POA: Diagnosis not present

## 2017-06-26 DIAGNOSIS — H4423 Degenerative myopia, bilateral: Secondary | ICD-10-CM

## 2017-06-26 DIAGNOSIS — H338 Other retinal detachments: Secondary | ICD-10-CM

## 2017-06-26 DIAGNOSIS — H43813 Vitreous degeneration, bilateral: Secondary | ICD-10-CM | POA: Diagnosis not present

## 2017-06-26 DIAGNOSIS — H33301 Unspecified retinal break, right eye: Secondary | ICD-10-CM

## 2017-06-26 DIAGNOSIS — H35033 Hypertensive retinopathy, bilateral: Secondary | ICD-10-CM

## 2017-06-26 DIAGNOSIS — I10 Essential (primary) hypertension: Secondary | ICD-10-CM | POA: Diagnosis not present

## 2017-07-09 DIAGNOSIS — K760 Fatty (change of) liver, not elsewhere classified: Secondary | ICD-10-CM | POA: Diagnosis not present

## 2017-07-09 DIAGNOSIS — Z008 Encounter for other general examination: Secondary | ICD-10-CM | POA: Diagnosis not present

## 2017-07-09 DIAGNOSIS — K7581 Nonalcoholic steatohepatitis (NASH): Secondary | ICD-10-CM | POA: Diagnosis not present

## 2017-07-09 DIAGNOSIS — G4733 Obstructive sleep apnea (adult) (pediatric): Secondary | ICD-10-CM | POA: Diagnosis not present

## 2017-07-16 DIAGNOSIS — H2511 Age-related nuclear cataract, right eye: Secondary | ICD-10-CM | POA: Diagnosis not present

## 2017-07-16 DIAGNOSIS — Z961 Presence of intraocular lens: Secondary | ICD-10-CM | POA: Diagnosis not present

## 2017-07-16 DIAGNOSIS — H401133 Primary open-angle glaucoma, bilateral, severe stage: Secondary | ICD-10-CM | POA: Diagnosis not present

## 2017-07-26 DIAGNOSIS — G4733 Obstructive sleep apnea (adult) (pediatric): Secondary | ICD-10-CM | POA: Diagnosis not present

## 2017-08-14 DIAGNOSIS — Z961 Presence of intraocular lens: Secondary | ICD-10-CM | POA: Diagnosis not present

## 2017-08-14 DIAGNOSIS — H2511 Age-related nuclear cataract, right eye: Secondary | ICD-10-CM | POA: Diagnosis not present

## 2017-08-14 DIAGNOSIS — H401133 Primary open-angle glaucoma, bilateral, severe stage: Secondary | ICD-10-CM | POA: Diagnosis not present

## 2017-08-16 DIAGNOSIS — M170 Bilateral primary osteoarthritis of knee: Secondary | ICD-10-CM | POA: Diagnosis not present

## 2017-08-23 DIAGNOSIS — M1712 Unilateral primary osteoarthritis, left knee: Secondary | ICD-10-CM | POA: Diagnosis not present

## 2017-08-23 DIAGNOSIS — M1711 Unilateral primary osteoarthritis, right knee: Secondary | ICD-10-CM | POA: Diagnosis not present

## 2017-08-29 DIAGNOSIS — Z79899 Other long term (current) drug therapy: Secondary | ICD-10-CM | POA: Diagnosis not present

## 2017-08-29 DIAGNOSIS — Z0000 Encounter for general adult medical examination without abnormal findings: Secondary | ICD-10-CM | POA: Diagnosis not present

## 2017-08-29 DIAGNOSIS — E7800 Pure hypercholesterolemia, unspecified: Secondary | ICD-10-CM | POA: Diagnosis not present

## 2017-08-29 DIAGNOSIS — Z125 Encounter for screening for malignant neoplasm of prostate: Secondary | ICD-10-CM | POA: Diagnosis not present

## 2017-09-03 DIAGNOSIS — M1712 Unilateral primary osteoarthritis, left knee: Secondary | ICD-10-CM | POA: Diagnosis not present

## 2017-09-03 DIAGNOSIS — M1711 Unilateral primary osteoarthritis, right knee: Secondary | ICD-10-CM | POA: Diagnosis not present

## 2017-09-04 DIAGNOSIS — H2511 Age-related nuclear cataract, right eye: Secondary | ICD-10-CM | POA: Diagnosis not present

## 2017-09-04 DIAGNOSIS — H401112 Primary open-angle glaucoma, right eye, moderate stage: Secondary | ICD-10-CM | POA: Diagnosis not present

## 2017-09-04 DIAGNOSIS — H401123 Primary open-angle glaucoma, left eye, severe stage: Secondary | ICD-10-CM | POA: Diagnosis not present

## 2017-09-22 IMAGING — RF DG CHOLANGIOGRAM OPERATIVE
1 series · 8 of 8 positions shown · non-contrast
Comparison: Nuclear medicine study 05/23/2016

CLINICAL DATA: 45-year-old male with a history of cholelithiasis

EXAM:
INTRAOPERATIVE CHOLANGIOGRAM
TECHNIQUE: Cholangiographic images from the C-arm fluoroscopic device were
submitted for interpretation post-operatively. Please see the
procedural report for the amount of contrast and the fluoroscopy
time utilized.

[Series 1: run · 2 acquisitions, 8 frames shown]
[im 1/2]
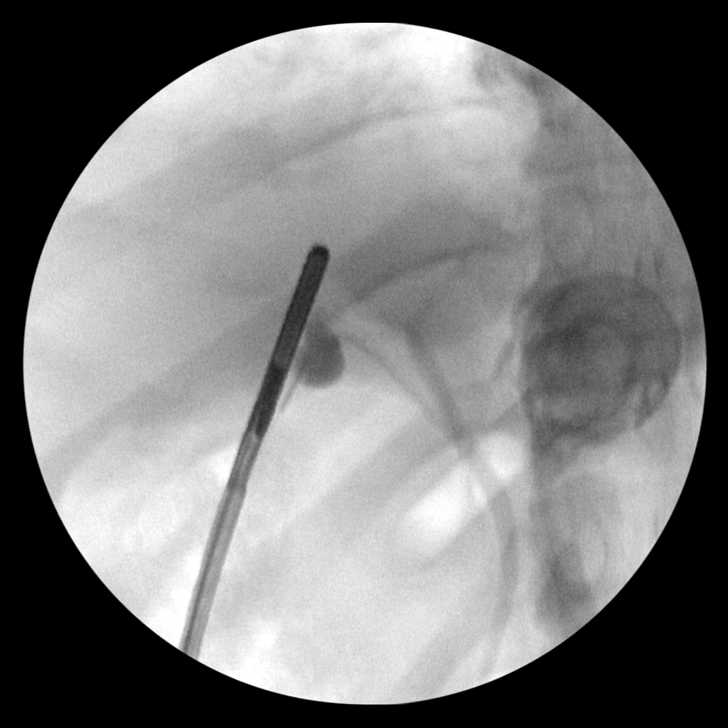
[im 1/2]
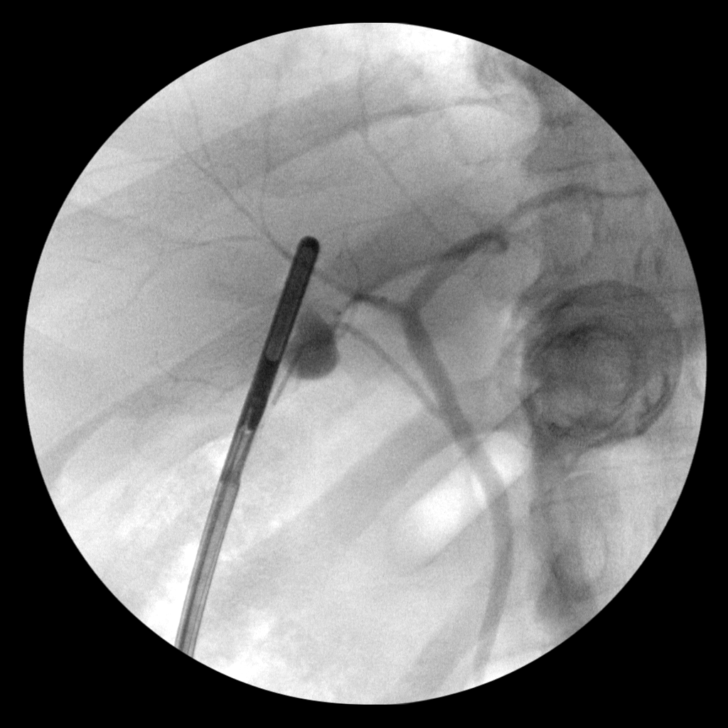
[im 1/2]
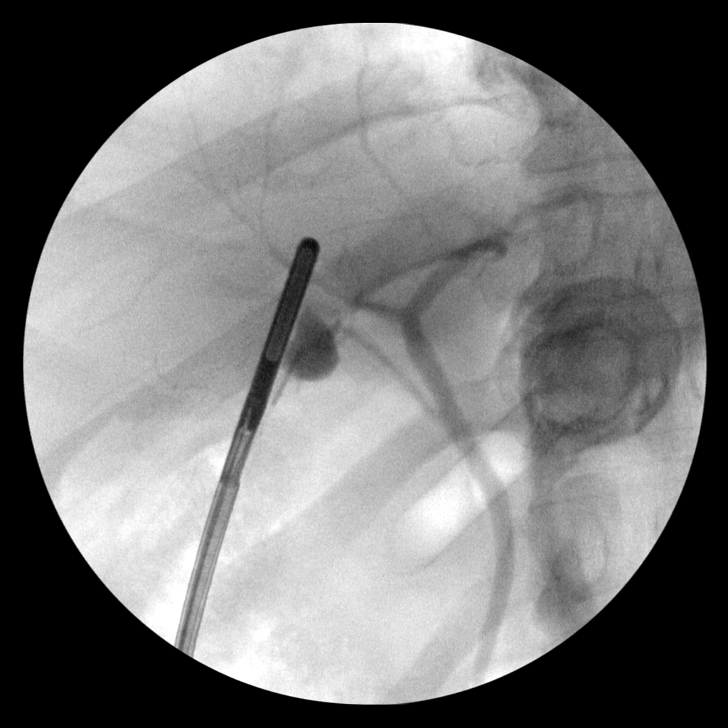
[im 1/2]
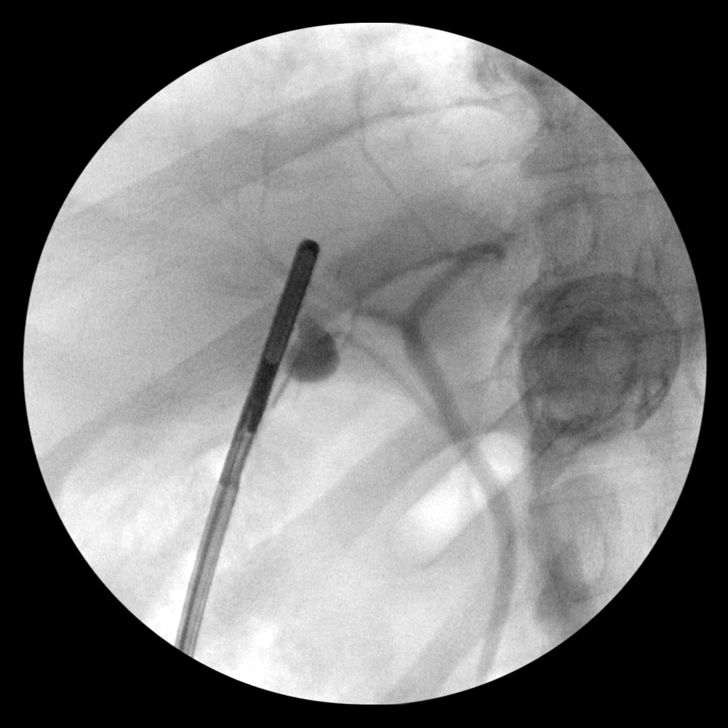
[im 2/2]
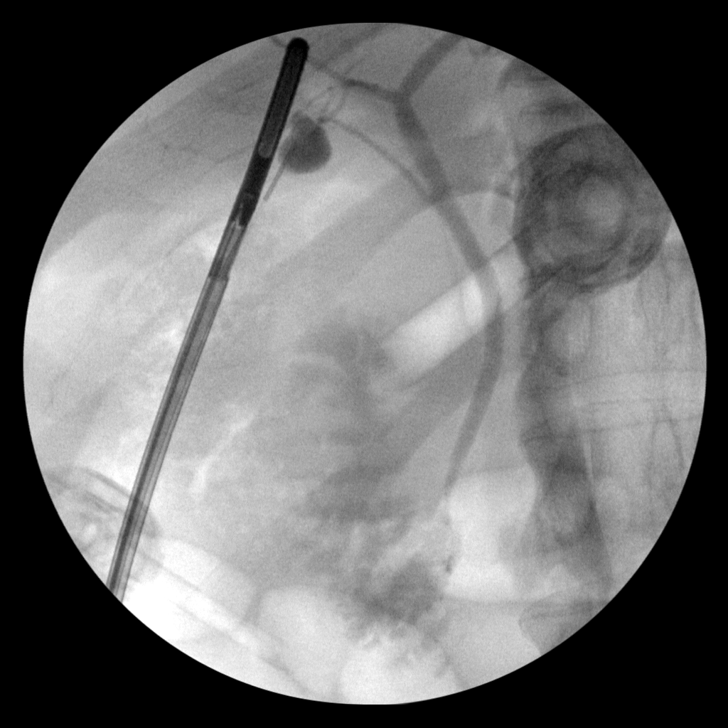
[im 2/2]
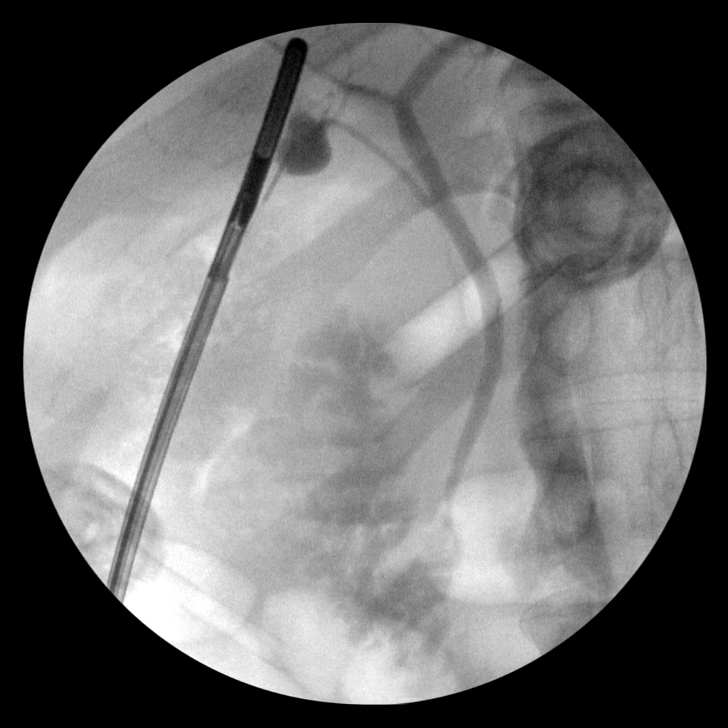
[im 2/2]
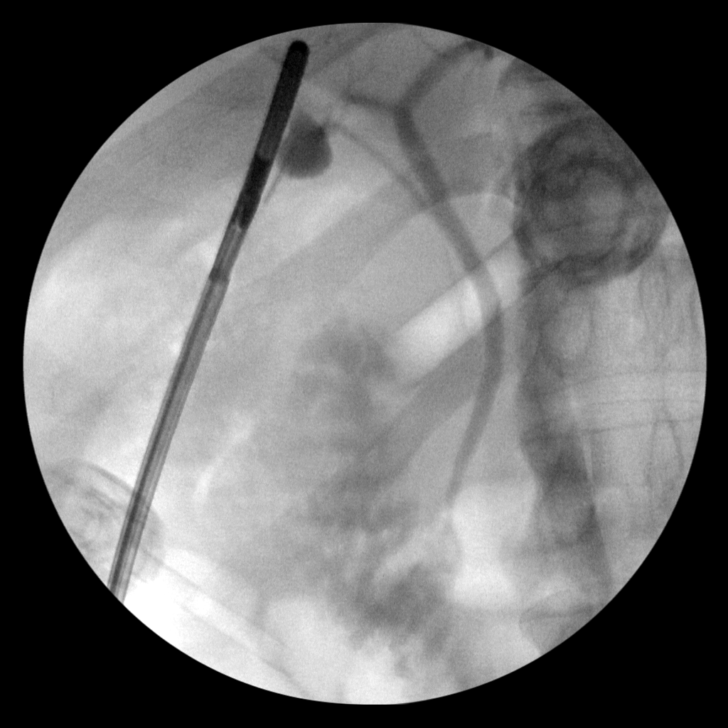
[im 2/2]
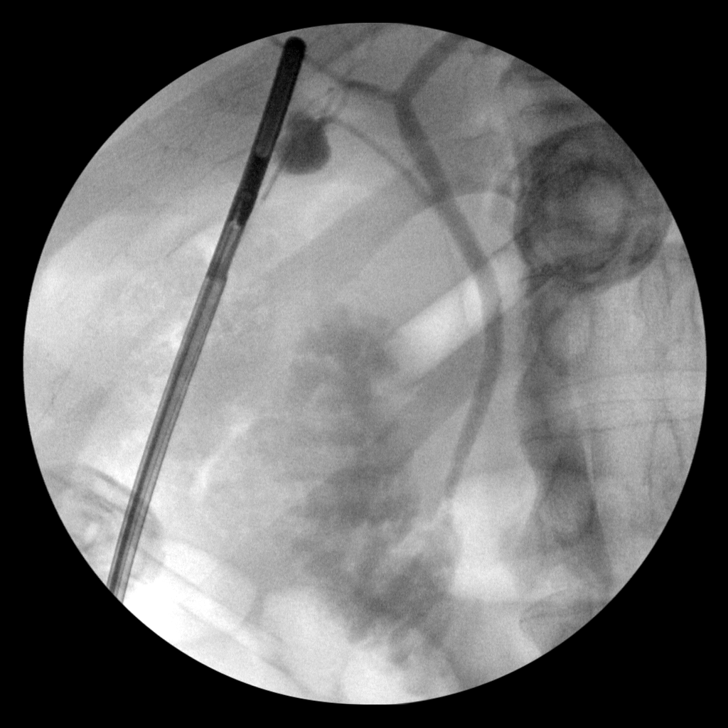

[8 of 8 positions shown; findings below may reference images not displayed]

FINDINGS: Surgical instruments project over the upper abdomen.

There is cannulation of the cystic duct/gallbladder neck, with
antegrade infusion of contrast. Caliber of the extrahepatic ductal
system within normal limits.

No large filling defect identified.

Free flow of contrast across the ampulla.
IMPRESSION: Intraoperative cholangiogram demonstrates extrahepatic biliary ducts
of unremarkable caliber, with no large filling defect identified.
Free flow of contrast across the ampulla.

Please refer to the dictated operative report for full details of
intraoperative findings and procedure

## 2017-10-30 DIAGNOSIS — M25562 Pain in left knee: Secondary | ICD-10-CM | POA: Diagnosis not present

## 2017-10-30 DIAGNOSIS — M25561 Pain in right knee: Secondary | ICD-10-CM | POA: Diagnosis not present

## 2017-11-04 DIAGNOSIS — H401112 Primary open-angle glaucoma, right eye, moderate stage: Secondary | ICD-10-CM | POA: Diagnosis not present

## 2017-11-04 DIAGNOSIS — H2511 Age-related nuclear cataract, right eye: Secondary | ICD-10-CM | POA: Diagnosis not present

## 2017-11-04 DIAGNOSIS — H401123 Primary open-angle glaucoma, left eye, severe stage: Secondary | ICD-10-CM | POA: Diagnosis not present

## 2018-01-24 DIAGNOSIS — M1711 Unilateral primary osteoarthritis, right knee: Secondary | ICD-10-CM | POA: Diagnosis not present

## 2018-02-04 DIAGNOSIS — H401112 Primary open-angle glaucoma, right eye, moderate stage: Secondary | ICD-10-CM | POA: Diagnosis not present

## 2018-02-04 DIAGNOSIS — H401123 Primary open-angle glaucoma, left eye, severe stage: Secondary | ICD-10-CM | POA: Diagnosis not present

## 2018-02-04 DIAGNOSIS — H2511 Age-related nuclear cataract, right eye: Secondary | ICD-10-CM | POA: Diagnosis not present

## 2018-06-18 DIAGNOSIS — R1011 Right upper quadrant pain: Secondary | ICD-10-CM | POA: Diagnosis not present

## 2018-06-18 DIAGNOSIS — H2511 Age-related nuclear cataract, right eye: Secondary | ICD-10-CM | POA: Diagnosis not present

## 2018-06-18 DIAGNOSIS — H401123 Primary open-angle glaucoma, left eye, severe stage: Secondary | ICD-10-CM | POA: Diagnosis not present

## 2018-06-18 DIAGNOSIS — H401112 Primary open-angle glaucoma, right eye, moderate stage: Secondary | ICD-10-CM | POA: Diagnosis not present

## 2018-06-18 DIAGNOSIS — R7309 Other abnormal glucose: Secondary | ICD-10-CM | POA: Diagnosis not present

## 2018-06-18 DIAGNOSIS — K760 Fatty (change of) liver, not elsewhere classified: Secondary | ICD-10-CM | POA: Diagnosis not present

## 2018-06-23 ENCOUNTER — Encounter: Payer: Self-pay | Admitting: Internal Medicine

## 2018-06-23 ENCOUNTER — Ambulatory Visit (INDEPENDENT_AMBULATORY_CARE_PROVIDER_SITE_OTHER): Payer: 59 | Admitting: Internal Medicine

## 2018-06-23 VITALS — BP 126/74 | HR 48 | Ht 73.0 in | Wt 276.6 lb

## 2018-06-23 DIAGNOSIS — R001 Bradycardia, unspecified: Secondary | ICD-10-CM | POA: Diagnosis not present

## 2018-06-23 NOTE — Progress Notes (Signed)
OFFICE FOLLOW-UP NOTE  Chief Complaint:  Follow-up studies  Primary Care Physician: Jonathon Jordan, MD  HPI:  Andre Decker is a 48 y.o. male recently seen by myself for bradycardia at the request of Dr. Kieth Brightly. Andre Decker was at the outpatient surgery center at [facility], about to undergo cholecystectomy. He was induced with by anesthesia and noted to become bradycardic with heart rate in the 40s and apparently ultimately dropped into the 20s at which time he was given Robinul which quickly reversed his bradycardia. The case was then canceled. I was consult said for management recommendations. There was no evidence of any chest pain or associated cardiac symptoms. He was completely asymptomatic after anesthesia wore off and wanted to go home. As part of a comprehensive workup I recommended an echocardiogram and exercise treadmill stress test. There is report performed in our office and I personally reviewed those studies. The stress test was negative for skin and his echocardiogram demonstrated an EF of 65-70% with normal wall motion and normal diastolic function.  06/23/2018  Andre Decker is seen today in follow-up.  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  He continues to have bradycardia with heart rate in the 40s today.  He seems asymptomatic with it.  He thought he needed to follow-up for routine monitoring.  He denies any new chest pain or worsening shortness of breath.  He did have a exercise stress testing which showed normal augmentation of heart rate.  His EKG today shows a sinus bradycardia with nonspecific T wave changes at 48.  Lab work from March 2019 showed total cholesterol 174, HDL 43, LDL 117 triglycerides 75.  Hemoglobin A1c of 6.1 creatinine 1.09.  PMHx:  Past Medical History:  Diagnosis Date  . Biliary colic   . Glaucoma   . Glaucoma of both eyes   . Hypertension   . IBS (irritable bowel syndrome)    chronic    . IT band syndrome   . Liver hemangioma    dx 2002  . OSA on CPAP    moderate osa per study 05-21-2004  . Osteoarthritis   . Wears contact lenses     Past Surgical History:  Procedure Laterality Date  . CATARACT EXTRACTION W/ INTRAOCULAR LENS IMPLANT Left 01/18/2012  . CHOLECYSTECTOMY N/A 11/01/2016   Procedure: LAPAROSCOPIC CHOLECYSTECTOMY;  Surgeon: Kinsinger, Arta Bruce, MD;  Location: [facility];  Service: General;  Laterality: N/A;  WILL NEED ANESTHESIA CONSULT PRIOR TO SURGERY - SEE NOTE IN SPECIAL NEEDS SECTION  . COLONOSCOPY WITH ESOPHAGOGASTRODUODENOSCOPY (EGD)  last one 11-24-2012  . RETINAL DETACHMENT REPAIR W/ SCLERAL BUCKLE LE Bilateral 09/23/2009   Left eye and Laser for Retinal breaks Right eye  . UPPER ESOPHAGEAL ENDOSCOPIC ULTRASOUND (EUS)  2017    [facility]    FAMHx:  Family History  Problem Relation Age of Onset  . Migraines Mother   . Prostate cancer Maternal Grandfather   . Prostate cancer Maternal Uncle   . Arthritis/Rheumatoid Maternal Grandmother   . Cancer Maternal Grandmother        unknown type  . Arthritis/Rheumatoid Paternal Grandmother   . Stroke Paternal Grandfather   . Colon cancer Neg Hx   . Stomach cancer Neg Hx     SOCHx:   reports that he has never smoked. He has never used smokeless tobacco. He reports that he does not drink alcohol or use drugs.  ALLERGIES:  Allergies  Allergen Reactions  .  Blood-Group Specific Substance     Pt is one of Jehovah's Witnesses    ROS: Pertinent items noted in HPI and remainder of comprehensive ROS otherwise negative.  HOME MEDS: Current Outpatient Medications on File Prior to Visit  Medication Sig Dispense Refill  . amLODipine (NORVASC) 5 MG tablet Take 5 mg by mouth daily.     . AZOPT 1 % ophthalmic suspension Place 1 drop into both eyes 2 (two) times daily.    . bimatoprost (LUMIGAN) 0.01 % SOLN Place 1 drop into both eyes at bedtime.     . brimonidine-timolol (COMBIGAN) 0.2-0.5 % ophthalmic solution  Place 1 drop into both eyes 2 (two) times daily.     Marland Kitchen desonide (DESOWEN) 0.05 % cream Apply 1 application topically daily as needed (rash).    . Fluocinolone Acetonide Scalp 0.01 % OIL Apply 1 application topically 2 (two) times a week.    Marland Kitchen ketoconazole (NIZORAL) 2 % cream Apply 1 application topically daily.    Marland Kitchen ketoconazole (NIZORAL) 2 % shampoo Apply 1 application topically 2 (two) times a week.    . RHOPRESSA 0.02 % SOLN Place 1 drop into both eyes daily.    . Vitamin D, Ergocalciferol, (DRISDOL) 1.25 MG (50000 UT) CAPS capsule Take 1 capsule by mouth daily.     No current facility-administered medications on file prior to visit.     LABS/IMAGING: No results found for this or any previous visit (from the past 48 hour(s)). No results found.  LIPID PANEL: No results found for: CHOL, TRIG, HDL, CHOLHDL, VLDL, LDLCALC, LDLDIRECT   WEIGHTS: Wt Readings from Last 3 Encounters:  06/23/18 276 lb 9.6 oz (125.5 kg)  01/24/17 269 lb 9.6 oz (122.3 kg)  11/22/16 276 lb 8 oz (125.4 kg)    VITALS: BP 126/74   Pulse (!) 48   Ht 6\' 1"  (1.854 m)   Wt 276 lb 9.6 oz (125.5 kg)   BMI 36.49 kg/m   EXAM: General appearance: alert and no distress Neck: no carotid bruit, no JVD and thyroid not enlarged, symmetric, no tenderness/mass/nodules Lungs: clear to auscultation bilaterally Heart: regular bradycradia Abdomen: soft, non-tender; bowel sounds normal; no masses,  no organomegaly Extremities: extremities normal, atraumatic, no cyanosis or edema Pulses: 2+ and symmetric Skin: Skin color, texture, turgor normal. No rashes or lesions Neurologic: Grossly normal Psych: Pleasant  EKG: Sinus bradycardia 48 nonspecific ST and T wave changes-personally reviewed  ASSESSMENT: 1. Vagally-mediated bradycardia  PLAN: 1.   Andre Decker has no new complaints and persistent bradycardia which I suspect is related to high vagal tone.  His EKG is nonspecific.  He is able to augment his heart rate with  exercise.  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  I will continue to monitor heart rate and if is not augmenting with exercise I am happy to see him about that.  Otherwise no further testing is necessary at this time.  Follow-up with me in 2 years per his request.  Pixie Casino, MD, [facility], [place] Director of the Advanced Lipid Disorders &  Cardiovascular Risk Reduction Clinic Diplomate of the American Board of Clinical Lipidology Attending Cardiologist  Direct Dial: [phone]  Fax: [phone]  Website:  [email or web address]  Nadean Corwin Hilty 06/23/2018, 2:12 PM

## 2018-06-23 NOTE — Patient Instructions (Signed)
Medication Instructions:  Continue current medications If you need a refill on your cardiac medications before your next appointment, please call your pharmacy.   Follow-Up: At [facility], you and your health needs are our priority.  As part of our continuing mission to provide you with exceptional heart care, we have created designated Provider Care Teams.  These Care Teams include your primary Cardiologist (physician) and Advanced Practice Providers (APPs -  Physician Assistants and Nurse Practitioners) who all work together to provide you with the care you need, when you need it. You will need a follow up appointment in 2 years.  Please call our office 2 months in advance to schedule this appointment.  You may see Dr. Debara Pickett or one of the following Advanced Practice Providers on your designated Care Team: Almyra Deforest, [place] . Fabian Sharp, PA-C  Any Other Special Instructions Will Be Listed Below (If Applicable).

## 2018-06-24 ENCOUNTER — Encounter: Payer: Self-pay | Admitting: Internal Medicine

## 2018-06-25 DIAGNOSIS — R7309 Other abnormal glucose: Secondary | ICD-10-CM

## 2018-06-25 DIAGNOSIS — Z8639 Personal history of other endocrine, nutritional and metabolic disease: Secondary | ICD-10-CM | POA: Insufficient documentation

## 2018-06-25 DIAGNOSIS — K760 Fatty (change of) liver, not elsewhere classified: Secondary | ICD-10-CM

## 2018-06-25 HISTORY — DX: Personal history of other endocrine, nutritional and metabolic disease: Z86.39

## 2018-06-25 HISTORY — DX: Other abnormal glucose: R73.09

## 2018-06-25 HISTORY — DX: Fatty (change of) liver, not elsewhere classified: K76.0

## 2018-07-02 ENCOUNTER — Encounter (INDEPENDENT_AMBULATORY_CARE_PROVIDER_SITE_OTHER): Payer: 59 | Admitting: Ophthalmology

## 2018-07-02 DIAGNOSIS — H43813 Vitreous degeneration, bilateral: Secondary | ICD-10-CM

## 2018-07-02 DIAGNOSIS — I10 Essential (primary) hypertension: Secondary | ICD-10-CM

## 2018-07-02 DIAGNOSIS — H2511 Age-related nuclear cataract, right eye: Secondary | ICD-10-CM

## 2018-07-02 DIAGNOSIS — H4423 Degenerative myopia, bilateral: Secondary | ICD-10-CM | POA: Diagnosis not present

## 2018-07-02 DIAGNOSIS — H33301 Unspecified retinal break, right eye: Secondary | ICD-10-CM | POA: Diagnosis not present

## 2018-07-02 DIAGNOSIS — H35033 Hypertensive retinopathy, bilateral: Secondary | ICD-10-CM

## 2018-07-02 DIAGNOSIS — H338 Other retinal detachments: Secondary | ICD-10-CM

## 2018-07-23 ENCOUNTER — Encounter (HOSPITAL_COMMUNITY): Payer: Self-pay | Admitting: Emergency Medicine

## 2018-07-23 ENCOUNTER — Other Ambulatory Visit: Payer: Self-pay

## 2018-07-23 ENCOUNTER — Emergency Department (HOSPITAL_COMMUNITY)
Admission: EM | Admit: 2018-07-23 | Discharge: 2018-07-23 | Disposition: A | Discharge status: Home / Self Care | Payer: 59 | Attending: Emergency Medicine | Admitting: Emergency Medicine

## 2018-07-23 ENCOUNTER — Emergency Department (HOSPITAL_COMMUNITY): Payer: 59

## 2018-07-23 DIAGNOSIS — R202 Paresthesia of skin: Secondary | ICD-10-CM | POA: Diagnosis not present

## 2018-07-23 DIAGNOSIS — R1084 Generalized abdominal pain: Secondary | ICD-10-CM | POA: Diagnosis not present

## 2018-07-23 DIAGNOSIS — I10 Essential (primary) hypertension: Secondary | ICD-10-CM | POA: Diagnosis not present

## 2018-07-23 DIAGNOSIS — R0689 Other abnormalities of breathing: Secondary | ICD-10-CM | POA: Diagnosis not present

## 2018-07-23 DIAGNOSIS — R079 Chest pain, unspecified: Secondary | ICD-10-CM | POA: Diagnosis not present

## 2018-07-23 DIAGNOSIS — Z79899 Other long term (current) drug therapy: Secondary | ICD-10-CM | POA: Insufficient documentation

## 2018-07-23 DIAGNOSIS — R109 Unspecified abdominal pain: Secondary | ICD-10-CM | POA: Diagnosis present

## 2018-07-23 LAB — CBC
HEMATOCRIT: 45.7 % (ref 39.0–52.0)
Hemoglobin: 14.9 g/dL (ref 13.0–17.0)
MCH: 26.8 pg (ref 26.0–34.0)
MCHC: 32.6 g/dL (ref 30.0–36.0)
MCV: 82.3 fL (ref 80.0–100.0)
Platelets: 232 10*3/uL (ref 150–400)
RBC: 5.55 MIL/uL (ref 4.22–5.81)
RDW: 13.1 % (ref 11.5–15.5)
WBC: 7.6 10*3/uL (ref 4.0–10.5)
nRBC: 0 % (ref 0.0–0.2)

## 2018-07-23 LAB — LIPASE, BLOOD: LIPASE: 22 U/L (ref 11–51)

## 2018-07-23 LAB — COMPREHENSIVE METABOLIC PANEL
ALT: 41 U/L (ref 0–44)
AST: 38 U/L (ref 15–41)
Albumin: 4.3 g/dL (ref 3.5–5.0)
Alkaline Phosphatase: 43 U/L (ref 38–126)
Anion gap: 11 (ref 5–15)
BUN: 9 mg/dL (ref 6–20)
CHLORIDE: 106 mmol/L (ref 98–111)
CO2: 23 mmol/L (ref 22–32)
Calcium: 10.1 mg/dL (ref 8.9–10.3)
Creatinine, Ser: 1.26 mg/dL — ABNORMAL HIGH (ref 0.61–1.24)
GFR calc Af Amer: >60 mL/min (ref >60)
GFR calc non Af Amer: >60 mL/min (ref >60)
Glucose, Bld: 99 mg/dL (ref 70–99)
Potassium: 3.5 mmol/L (ref 3.5–5.1)
Sodium: 140 mmol/L (ref 135–145)
Total Bilirubin: 1.9 mg/dL — ABNORMAL HIGH (ref 0.3–1.2)
Total Protein: 7.8 g/dL (ref 6.5–8.1)

## 2018-07-23 LAB — URINALYSIS, ROUTINE W REFLEX MICROSCOPIC
Bilirubin Urine: NEGATIVE
Glucose, UA: NEGATIVE mg/dL
Hgb urine dipstick: NEGATIVE
Ketones, ur: 5 mg/dL — AB
Leukocytes,Ua: NEGATIVE
Nitrite: NEGATIVE
PH: 8 (ref 5.0–8.0)
Protein, ur: NEGATIVE mg/dL
Specific Gravity, Urine: 1.013 (ref 1.005–1.030)

## 2018-07-23 LAB — TROPONIN I: Troponin I: <0.03 ng/mL (ref <0.03)

## 2018-07-23 LAB — LACTIC ACID, PLASMA: Lactic Acid, Venous: 1.6 mmol/L (ref 0.5–1.9)

## 2018-07-23 MED ORDER — SODIUM CHLORIDE 0.9% FLUSH
3.0000 mL | Freq: Once | INTRAVENOUS | Status: AC
  Administered 2018-07-23: 3 mL via INTRAVENOUS

## 2018-07-23 MED ORDER — SODIUM CHLORIDE 0.9 % IV BOLUS
500.0000 mL | Freq: Once | INTRAVENOUS | Status: AC
  Administered 2018-07-23: 500 mL via INTRAVENOUS

## 2018-07-23 MED ORDER — ONDANSETRON HCL 4 MG/2ML IJ SOLN
4.0000 mg | Freq: Once | INTRAMUSCULAR | Status: AC
  Administered 2018-07-23: 4 mg via INTRAVENOUS
  Filled 2018-07-23: qty 2

## 2018-07-23 MED ORDER — DICYCLOMINE HCL 10 MG PO CAPS
20.0000 mg | ORAL_CAPSULE | Freq: Once | ORAL | Status: AC
  Administered 2018-07-23: 20 mg via ORAL
  Filled 2018-07-23: qty 2

## 2018-07-23 MED ORDER — MORPHINE SULFATE (PF) 4 MG/ML IV SOLN
4.0000 mg | Freq: Once | INTRAVENOUS | Status: AC
  Administered 2018-07-23: 4 mg via INTRAVENOUS
  Filled 2018-07-23: qty 1

## 2018-07-23 MED ORDER — OXYCODONE-ACETAMINOPHEN 5-325 MG PO TABS: 1.0000 | ORAL_TABLET | Freq: Four times a day (QID) | ORAL | 0 refills | Status: DC | PRN

## 2018-07-23 MED ORDER — IOHEXOL 300 MG/ML  SOLN
100.0000 mL | Freq: Once | INTRAMUSCULAR | Status: AC | PRN
  Administered 2018-07-23: 100 mL via INTRAVENOUS

## 2018-07-23 MED ORDER — SODIUM CHLORIDE 0.9 % IV BOLUS
1000.0000 mL | Freq: Once | INTRAVENOUS | Status: AC
  Administered 2018-07-23: 1000 mL via INTRAVENOUS

## 2018-07-23 MED ORDER — DICYCLOMINE HCL 20 MG PO TABS: 20.0000 mg | ORAL_TABLET | Freq: Three times a day (TID) | ORAL | 0 refills | Status: DC

## 2018-07-23 MED ORDER — ONDANSETRON 4 MG PO TBDP: 4.0000 mg | ORAL_TABLET | Freq: Three times a day (TID) | ORAL | 0 refills | Status: DC | PRN

## 2018-07-23 NOTE — ED Triage Notes (Signed)
Pt reports generalized abd pains that started approximately 1500hrs. Pt reports the pain goes back and forth across his abd.

## 2018-07-23 NOTE — ED Notes (Signed)
Signature pad not working, pt verbalized understanding of discharge instructions, follow-up instructions and prescriptions.

## 2018-07-23 NOTE — ED Provider Notes (Signed)
[facility] EMERGENCY DEPARTMENT Provider Note   CSN: [id number] Arrival date & time: 07/23/18  1614    History   Chief Complaint Chief Complaint  Patient presents with  . Abdominal Pain    HPI Andre Decker is a 48 y.o. male with a past medical history of IBS, hypertension, biliary colic, liver hemangioma, fatty liver, who presents today for evaluation of abdominal pain.  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  He states that it is all over and feels like it is radiating into the left lower quadrant.  He says that in the past he has had similar abdominal pains however it has never radiated into the left side before.  He has followed both [facility] and [facility] Dr. Hilarie Fredrickson.  He reports that when this started he felt spasms in his hands, tingling around his mouth, and tingling in his arms and legs bilaterally.  He says he has not had this before.  He reports that at the time people told him he was breathing quickly.  He says that this has fully resolved.  He denies nausea vomiting or diarrhea.       HPI  Past Medical History:  Diagnosis Date  . Biliary colic   . Glaucoma   . Glaucoma of both eyes   . Hypertension   . IBS (irritable bowel syndrome)    chronic  . IT band syndrome   . Liver hemangioma    dx 2002  . OSA on CPAP    moderate osa per study 05-21-2004  . Osteoarthritis   . Wears contact lenses     Patient Active Problem List   Diagnosis Date Noted  . 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 Biliary colic 68/34/1962  . Bradycardia 09/26/2016  . Paresthesia of left foot 04/20/2015  . IRRITABLE BOWEL SYNDROME 05/23/2010  . OBESITY, UNSPECIFIED 03/15/2009  . HEMANGIOMA, HEPATIC 09/06/2007  . HYPERTENSION 09/06/2007    Past Surgical History:  Procedure Laterality Date  . CATARACT EXTRACTION W/ INTRAOCULAR LENS IMPLANT Left 01/18/2012  . CHOLECYSTECTOMY N/A 11/01/2016   Procedure: LAPAROSCOPIC CHOLECYSTECTOMY;  Surgeon: Kinsinger, Arta Bruce, MD;  Location: [facility];  Service: General;  Laterality: N/A;  WILL NEED ANESTHESIA CONSULT PRIOR TO SURGERY - SEE NOTE IN SPECIAL NEEDS SECTION  . COLONOSCOPY WITH ESOPHAGOGASTRODUODENOSCOPY (EGD)  last one 11-24-2012  . RETINAL DETACHMENT REPAIR W/ SCLERAL BUCKLE LE Bilateral 09/23/2009   Left eye and Laser for Retinal breaks Right eye  . UPPER ESOPHAGEAL ENDOSCOPIC ULTRASOUND (EUS)  2017    [facility]        Home Medications    Prior to Admission medications   Medication Sig Start Date End Date Taking? Authorizing Provider  amLODipine (NORVASC) 5 MG tablet Take 5 mg by mouth daily.  07/27/15   [provider]  AZOPT 1 % ophthalmic suspension Place 1 drop into both eyes 2 (two) times daily. 05/27/14   [provider]  bimatoprost (LUMIGAN) 0.01 % SOLN Place 1 drop into both eyes at bedtime.     [provider]  brimonidine-timolol (COMBIGAN) 0.2-0.5 % ophthalmic solution Place 1 drop into both eyes 2 (two) times daily.     [provider]  desonide (DESOWEN) 0.05 % cream Apply 1 application topically daily as needed (rash).    [provider]  dicyclomine (BENTYL) 20 MG tablet Take 1 tablet (20 mg total) by mouth 4 (four)  times daily -  before meals and at bedtime for 10 days. 07/23/18 08/02/18  Lorin Glass, PA-C  Fluocinolone Acetonide Scalp 0.01 % OIL Apply 1 application topically 2 (two) times a week.    [provider]  ketoconazole (NIZORAL) 2 % cream Apply 1 application topically daily.    [provider]  ketoconazole (NIZORAL) 2 % shampoo Apply 1 application topically 2 (two) times a week.    [provider]  LOSARTAN POTASSIUM PO Take by mouth daily.    [provider]  ondansetron (ZOFRAN ODT) 4 MG disintegrating tablet Take 1 tablet (4 mg total) by mouth every 8 (eight) hours as needed for nausea or vomiting. 07/23/18   Lorin Glass, PA-C  oxyCODONE-acetaminophen (PERCOCET/ROXICET) 5-325 MG tablet Take 1 tablet by mouth every 6 (six) hours as needed for severe pain. 07/23/18   Lorin Glass, PA-C  RHOPRESSA 0.02 % SOLN Place 1 drop into both eyes daily. 06/18/18   [provider]  Vitamin D, Ergocalciferol, (DRISDOL) 1.25 MG (50000 UT) CAPS capsule Take 1 capsule by mouth daily. 06/21/18   [provider]    Family History Family History  Problem Relation Age of Onset  . Migraines Mother   . Prostate cancer Maternal Grandfather   . Prostate cancer Maternal Uncle   . Arthritis/Rheumatoid Maternal Grandmother   . Cancer Maternal Grandmother        unknown type  . Arthritis/Rheumatoid Paternal Grandmother   . Stroke Paternal Grandfather   . Colon cancer Neg Hx   . Stomach cancer Neg Hx     Social History Social History   Tobacco Use  . Smoking status: Never Smoker  . Smokeless tobacco: Never Used  Substance Use Topics  . Alcohol use: No    Alcohol/week: 0.0 standard drinks  . Drug use: No     Allergies   Blood-group specific substance   Review of Systems Review of Systems  Constitutional: Negative for chills and fever.  HENT: Negative for congestion.   Respiratory: Negative for chest tightness and shortness of breath.   Cardiovascular: Negative for chest pain.  Gastrointestinal: Positive for abdominal pain. Negative for diarrhea, nausea and vomiting.  Genitourinary: Negative for dysuria.  Neurological:       Tingling  All other systems reviewed and are negative.    Physical Exam Updated Vital Signs BP (!) 141/97   Pulse 64   Temp 99.3 F (37.4 C) (Oral)   Resp (!) 21   Ht 6\' 1"  (1.854 m)   Wt 117.9 kg   SpO2 98%   BMI 34.30 kg/m   Physical Exam Vitals signs and nursing note reviewed.  Constitutional:      Appearance: He is well-developed. He is obese. He is ill-appearing.  HENT:     Head: Normocephalic and atraumatic.  Eyes:      Conjunctiva/sclera: Conjunctivae normal.  Neck:     Musculoskeletal: Neck supple.  Cardiovascular:     Rate and Rhythm: Normal rate and regular rhythm.     Heart sounds: Normal heart sounds. No murmur.  Pulmonary:     Effort: Pulmonary effort is normal. No respiratory distress.     Breath sounds: Normal breath sounds.  Abdominal:     General: Abdomen is flat. Bowel sounds are normal. There is no distension.     Palpations: Abdomen is soft. There is no mass.     Tenderness: There is generalized abdominal tenderness. There is guarding and rebound.  Hernia: No hernia is present.  Skin:    General: Skin is warm and dry.  Neurological:     General: No focal deficit present.     Mental Status: He is alert.     GCS: GCS eye subscore is 4. GCS verbal subscore is 5. GCS motor subscore is 6.     Cranial Nerves: No cranial nerve deficit.     Sensory: Sensation is intact.     Motor: No weakness.  Psychiatric:        Mood and Affect: Mood normal. Mood is not anxious.      ED Treatments / Results  Labs (all labs ordered are listed, but only abnormal results are displayed) Labs Reviewed  COMPREHENSIVE METABOLIC PANEL - Abnormal; Notable for the following components:      Result Value   Creatinine, Ser 1.26 (*)    Total Bilirubin 1.9 (*)    All other components within normal limits  URINALYSIS, ROUTINE W REFLEX MICROSCOPIC - Abnormal; Notable for the following components:   Ketones, ur 5 (*)    All other components within normal limits  LIPASE, BLOOD  CBC  TROPONIN I  LACTIC ACID, PLASMA  LACTIC ACID, PLASMA    EKG EKG Interpretation  Date/Time:  Wednesday July 23 2018 16:15:55 EST Ventricular Rate:  97 PR Interval:  184 QRS Duration: 92 QT Interval:  326 QTC Calculation: 414 R Axis:   81 Text Interpretation:  Normal sinus rhythm T wave abnormality, consider inferior ischemia T wave abnormality, consider anterolateral ischemia Abnormal ECG T wave inversion appear  chronic, slight more pronouced Confirmed by Lennice Sites [phone]) on 07/23/2018 6:51:20 PM   Radiology Ct Abdomen Pelvis W Contrast  Result Date: 07/23/2018 CLINICAL DATA:  Generalized abdominal pain starting at 1500 hours. EXAM: CT ABDOMEN AND PELVIS WITH CONTRAST TECHNIQUE: Multidetector CT imaging of the abdomen and pelvis was performed using the standard protocol following bolus administration of intravenous contrast. CONTRAST:  129mL OMNIPAQUE IOHEXOL 300 MG/ML  SOLN COMPARISON:  None. FINDINGS: Lower chest: No acute abnormality. Hepatobiliary: Ill-defined right hepatic hypodensity measuring approximately 3.5 x 2.8 x 3.9 cm, stable in appearance since 2017 consistent with a benign finding likely hemangioma given gradual peripheral puddling enhancement suggested on repeat delayed imaging through the kidneys which include this lesion. Cholecystectomy. No biliary dilatation. Pancreas: Unremarkable. No pancreatic ductal dilatation or surrounding inflammatory changes. Spleen: Normal in size without focal abnormality. Adrenals/Urinary Tract: Small low-density adrenal nodules similar in appearance to prior consistent with adenomata, the largest is seen on the right measuring 12 mm. Symmetric cortical enhancement of both kidneys with symmetric pyelograms on repeat delayed imaging. The ureters are unremarkable. The bladder is normal. Stomach/Bowel: Stomach is within normal limits. Appendix appears normal. Mild fluid-filled distention of small bowel without mechanical bowel obstruction query enteritis. Unremarkable colon without inflammation or obstruction. A few scattered colonic diverticula are noted in the sigmoid. Vascular/Lymphatic: No significant vascular findings are present. No enlarged abdominal or pelvic lymph nodes. Reproductive: Prostate is unremarkable. Other: No abdominal wall hernia or abnormality. No abdominopelvic ascites. Musculoskeletal: No acute or significant osseous findings. IMPRESSION: 1.  Stable ill-defined right hepatic hypodensity measuring 3.5 x 2.8 x 3.9 cm consistent with a benign finding likely hemangioma given gradual peripheral puddling enhancement suggested on repeat delayed imaging. 2. Stable small low-density adrenal nodules consistent with adenomas. 3. Mild fluid-filled distention of small bowel without mechanical obstruction query enteritis. Electronically Signed   By: Ashley Royalty M.D.   On: 07/23/2018 19:50  Procedures Procedures (including critical care time)  Medications Ordered in ED Medications  sodium chloride flush (NS) 0.9 % injection 3 mL (3 mLs Intravenous Given 07/23/18 1916)  morphine 4 MG/ML injection 4 mg (4 mg Intravenous Given 07/23/18 1901)  sodium chloride 0.9 % bolus 1,000 mL (0 mLs Intravenous Stopped 07/23/18 2139)  ondansetron (ZOFRAN) injection 4 mg (4 mg Intravenous Given 07/23/18 1901)  sodium chloride 0.9 % bolus 500 mL (0 mLs Intravenous Stopped 07/23/18 2228)  iohexol (OMNIPAQUE) 300 MG/ML solution 100 mL (100 mLs Intravenous Contrast Given 07/23/18 1932)  dicyclomine (BENTYL) capsule 20 mg (20 mg Oral Given 07/23/18 2139)     Initial Impression / Assessment and Plan / ED Course  I have reviewed the triage vital signs and the nursing notes.  Pertinent labs & imaging results that were available during my care of the patient were reviewed by me and considered in my medical decision making (see chart for details).  Clinical Course as of Jul 23 2249  Wed Jul 23, 2018  2114 Patient reevaluated, he says that his pain is still at a 7 or an 8 however he wishes to go home as in the past he has felt better after he is able to sleep, uninterrupted, for multiple hours.  Will p.o. challenge which will also give him time to complete saline bolus.   [EH]  2117 Sleep apnea related.   SpO2(!): 87 % [EH]  2213 Patient reevaluated, he says his pain and symptoms are unchanged and still wishes for discharge at this time.  He is on his phone in no obvious  distress, has been able to eat and drink without vomiting.   [EH]    Clinical Course User Index [EH] Lorin Glass, PA-C      On repeat exam patient is nontoxic, nonseptic appearing, in no apparent distress.  Patient's pain and other symptoms adequately managed in emergency department.  Fluid bolus given.  Labs, imaging and vitals reviewed.  Patient does not meet the SIRS or Sepsis criteria.  On repeat exam patient does not have a surgical abdomen and there are no peritoneal signs.  No indication of appendicitis, bowel obstruction, bowel perforation, cholecystitis, diverticulitis.  Given the initial severity of his pain CT scan was ordered showing loops of fluid small bowel with out obstruction.  Patient was re-evaluated, says when his pain is like this he normally needs to go home and sleep.  He requests discharge.  He is followed by GI for this pain and appears reliable.  Patient's wife in room and says she is comfortable taking him home and that this is consistent with his usual episodes.  Patient discharged home with symptomatic treatment and given strict instructions for follow-up with their primary care physician.  I have also discussed reasons to return immediately to the ER.  Patient expresses understanding and agrees with plan.  Final Clinical Impressions(s) / ED Diagnoses   Final diagnoses:  Generalized abdominal pain    ED Discharge Orders         Ordered    dicyclomine (BENTYL) 20 MG tablet  3 times daily before meals & bedtime     07/23/18 2219    oxyCODONE-acetaminophen (PERCOCET/ROXICET) 5-325 MG tablet  Every 6 hours PRN     07/23/18 2219    ondansetron (ZOFRAN ODT) 4 MG disintegrating tablet  Every 8 hours PRN     07/23/18 2219           Lorin Glass, [place] 07/23/18 2254  Lennice Sites, DO 07/23/18 2312

## 2018-07-23 NOTE — Discharge Instructions (Signed)
As we discussed your CT scan showed multiple unchanged abnormalities.  The only new finding today is that you have loops of your small intestine that appear to be fluid-filled.  This is consistent with irritation or inflammation.  I have given you 3 prescriptions today.  1 of them is for pain.  The Percocet can make you sleepy, please use caution while taking this especially at nighttime with your sleep apnea.  Please only take this if you absolutely need to. Zofran is a nausea medicine.  Bentyl is a medicine to help with spasms of the intestines.  Please start by eating bland foods such as bread, bananas, rice, and applesauce.  Today you received medications that may make you sleepy or impair your ability to make decisions.  For the next 24 hours please do not drive, operate heavy machinery, care for a small child with out another adult present, or perform any activities that may cause harm to you or someone else if you were to fall asleep or be impaired.   You are being prescribed a medication which may make you sleepy. Please follow up of listed precautions for at least 24 hours after taking one dose.

## 2018-07-24 ENCOUNTER — Telehealth: Payer: Self-pay | Admitting: Internal Medicine

## 2018-07-24 NOTE — Telephone Encounter (Signed)
Left message to call back to schedule ov.

## 2018-07-24 NOTE — Telephone Encounter (Signed)
Ov scheduled with Amy on 3/2 as Dr. Hilarie Fredrickson did not have anything until April.

## 2018-07-24 NOTE — Telephone Encounter (Signed)
Pt called requesting an ov with Dr. Hilarie Fredrickson, he stated that Dr. Hilarie Fredrickson referred him to [facility]. Pt was seen at [facility] ED yesterday and was told to f/u with his GI. Pls advise if it is ok to schedule him for f/u with Dr. Hilarie Fredrickson.

## 2018-07-24 NOTE — Telephone Encounter (Signed)
Please schedule pt an appointment to see Dr. Hilarie Fredrickson.

## 2018-08-04 ENCOUNTER — Ambulatory Visit: Payer: Self-pay | Admitting: Physician Assistant

## 2018-09-23 DIAGNOSIS — G4733 Obstructive sleep apnea (adult) (pediatric): Secondary | ICD-10-CM | POA: Diagnosis not present

## 2018-10-15 DIAGNOSIS — H401123 Primary open-angle glaucoma, left eye, severe stage: Secondary | ICD-10-CM | POA: Diagnosis not present

## 2018-10-15 DIAGNOSIS — H401112 Primary open-angle glaucoma, right eye, moderate stage: Secondary | ICD-10-CM | POA: Diagnosis not present

## 2018-10-15 DIAGNOSIS — H4423 Degenerative myopia, bilateral: Secondary | ICD-10-CM | POA: Diagnosis not present

## 2018-10-16 DIAGNOSIS — L218 Other seborrheic dermatitis: Secondary | ICD-10-CM | POA: Diagnosis not present

## 2018-10-16 DIAGNOSIS — R7303 Prediabetes: Secondary | ICD-10-CM | POA: Diagnosis not present

## 2018-10-16 DIAGNOSIS — I10 Essential (primary) hypertension: Secondary | ICD-10-CM | POA: Diagnosis not present

## 2018-10-16 DIAGNOSIS — E7800 Pure hypercholesterolemia, unspecified: Secondary | ICD-10-CM | POA: Diagnosis not present

## 2018-10-20 DIAGNOSIS — E7800 Pure hypercholesterolemia, unspecified: Secondary | ICD-10-CM | POA: Diagnosis not present

## 2018-10-20 DIAGNOSIS — Z79899 Other long term (current) drug therapy: Secondary | ICD-10-CM | POA: Diagnosis not present

## 2018-10-20 DIAGNOSIS — R7303 Prediabetes: Secondary | ICD-10-CM | POA: Diagnosis not present

## 2018-11-18 ENCOUNTER — Emergency Department (HOSPITAL_COMMUNITY)
Admission: EM | Admit: 2018-11-18 | Discharge: 2018-11-18 | Disposition: A | Discharge status: Home / Self Care | Payer: 59 | Attending: Emergency Medicine | Admitting: Emergency Medicine

## 2018-11-18 ENCOUNTER — Other Ambulatory Visit: Payer: Self-pay

## 2018-11-18 DIAGNOSIS — I10 Essential (primary) hypertension: Secondary | ICD-10-CM | POA: Diagnosis not present

## 2018-11-18 DIAGNOSIS — M549 Dorsalgia, unspecified: Secondary | ICD-10-CM | POA: Insufficient documentation

## 2018-11-18 DIAGNOSIS — Z79899 Other long term (current) drug therapy: Secondary | ICD-10-CM | POA: Diagnosis not present

## 2018-11-18 DIAGNOSIS — M542 Cervicalgia: Secondary | ICD-10-CM | POA: Insufficient documentation

## 2018-11-18 MED ORDER — CYCLOBENZAPRINE HCL 10 MG PO TABS: 10.0000 mg | ORAL_TABLET | Freq: Two times a day (BID) | ORAL | 0 refills | Status: DC | PRN
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
## 2018-11-18 NOTE — Discharge Instructions (Signed)
Please read attached information. If you experience any new or worsening signs or symptoms please return to the emergency room for evaluation. Please follow-up with your primary care provider or specialist as discussed. Please use medication prescribed only as directed and discontinue taking if you have any concerning signs or symptoms.   °
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
## 2018-12-29 ENCOUNTER — Other Ambulatory Visit: Payer: Self-pay

## 2018-12-29 DIAGNOSIS — Z20822 Contact with and (suspected) exposure to covid-19: Secondary | ICD-10-CM

## 2018-12-30 LAB — NOVEL CORONAVIRUS, NAA: SARS-CoV-2, NAA: NOT DETECTED

## 2019-02-17 ENCOUNTER — Telehealth: Payer: Self-pay | Admitting: Internal Medicine

## 2019-02-17 NOTE — Telephone Encounter (Signed)
Pt was referred for gallbladder surgery due to recurrent RUQ pain that he was having. Pt aware.

## 2019-06-13 IMAGING — CT CT ABD-PELV W/ CM
2 of 5 series · 16 of 46 positions shown, 18 images · IV contrast (omnipaque)
Comparison: None.

CLINICAL DATA: Generalized abdominal pain starting at 1133 hours.

EXAM:
CT ABDOMEN AND PELVIS WITH CONTRAST
TECHNIQUE: Multidetector CT imaging of the abdomen and pelvis was performed
using the standard protocol following bolus administration of
intravenous contrast.
CONTRAST:  100mL OMNIPAQUE IOHEXOL 300 MG/ML  SOLN

[Series 3: a/p w/ 5mm · axial · 0.98mm/px · z∈[+662,+1098]mm · 13 of 97 slices shown, 15 images]
[im 5/97  soft-tissue]
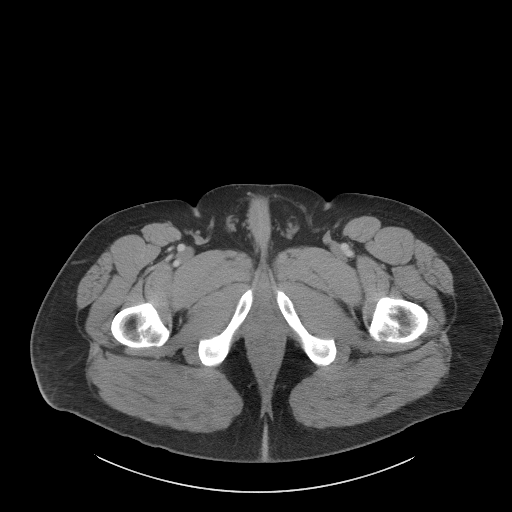
[im 5/97  bone]
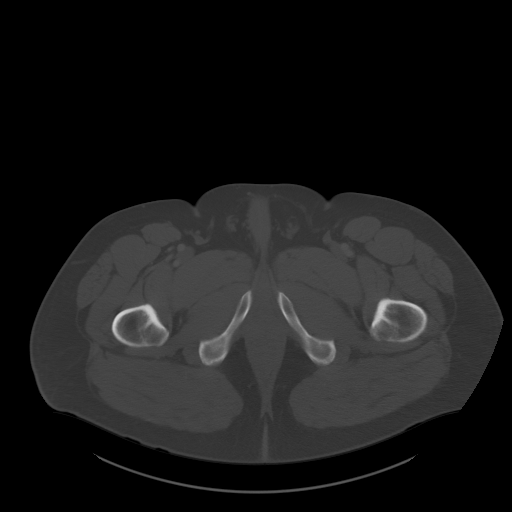
[im 15/97  soft-tissue]
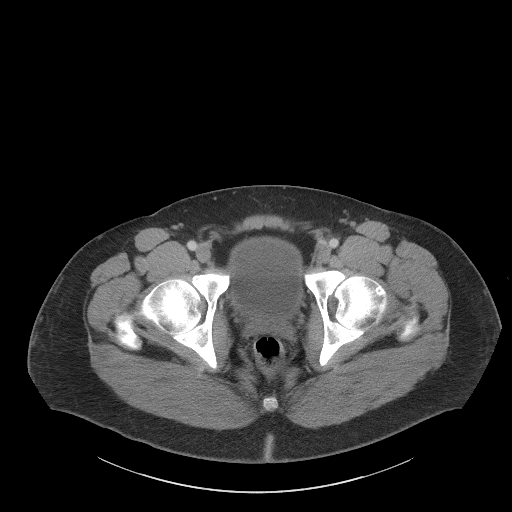
[im 20/97  soft-tissue]
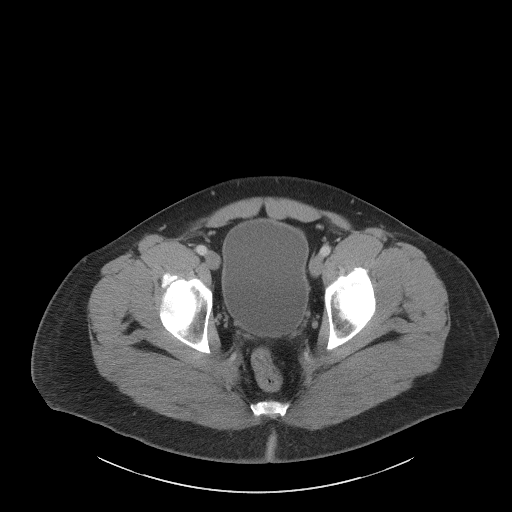
[im 29/97  soft-tissue]
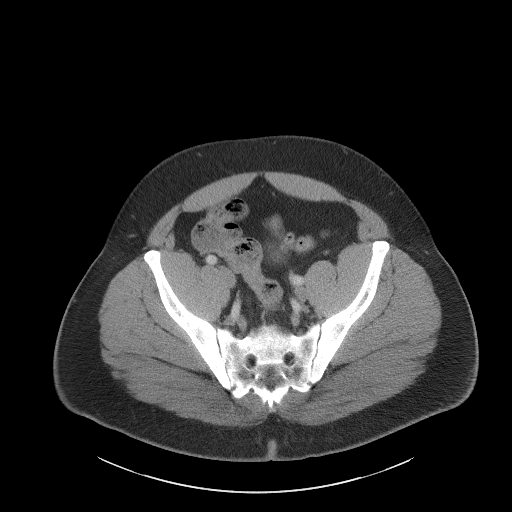
[im 34/97  soft-tissue]
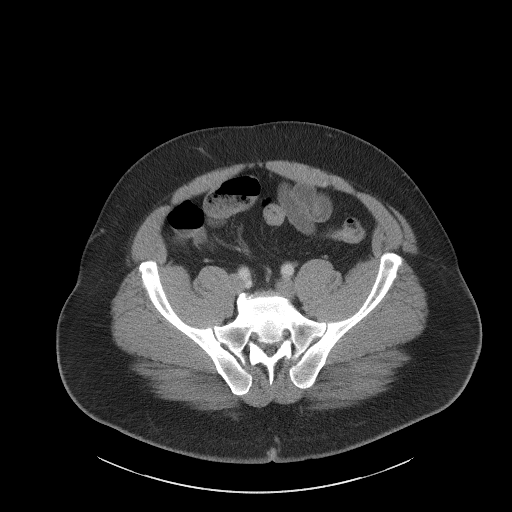
[im 44/97  soft-tissue]
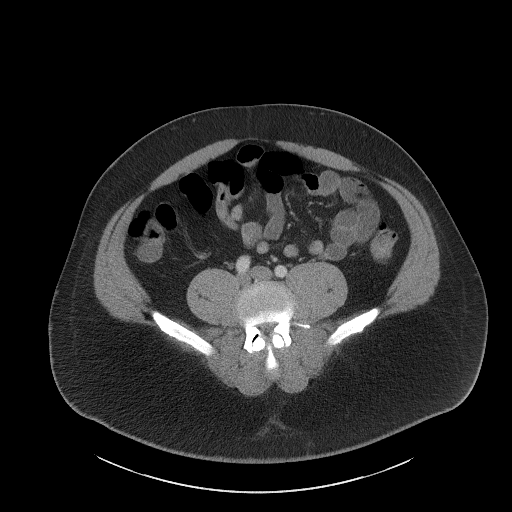
[im 49/97  soft-tissue]
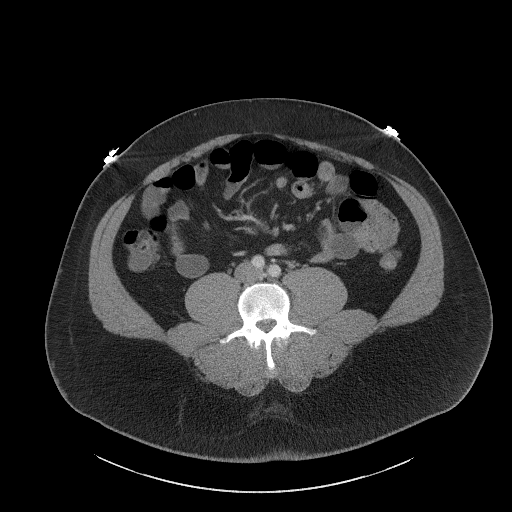
[im 53/97  soft-tissue]
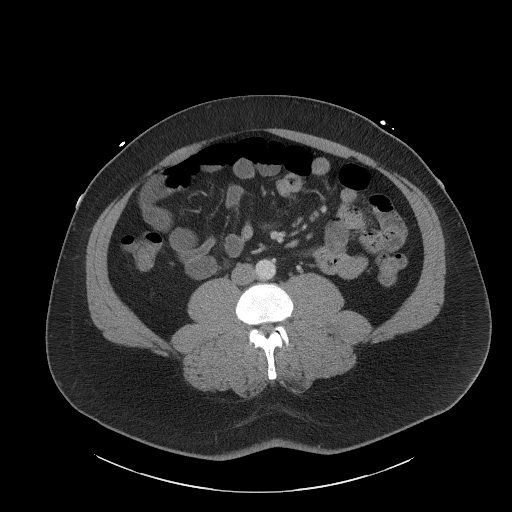
[im 63/97  soft-tissue]
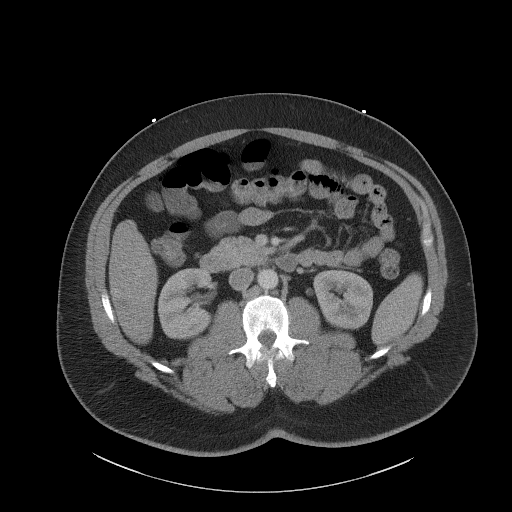
[im 63/97  bone]
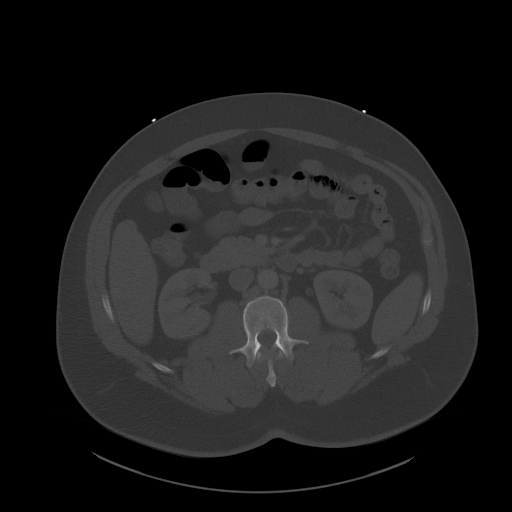
[im 68/97  soft-tissue]
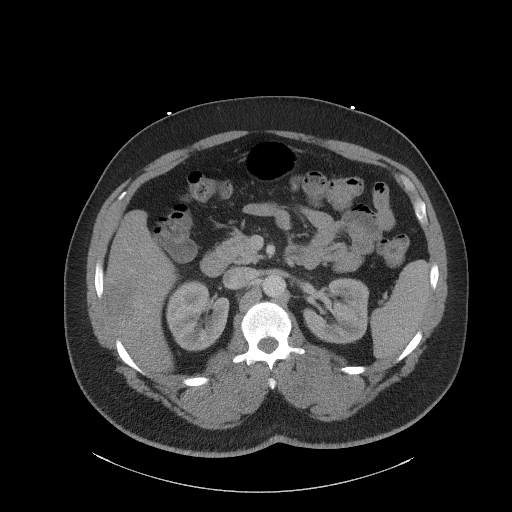
[im 77/97  soft-tissue]
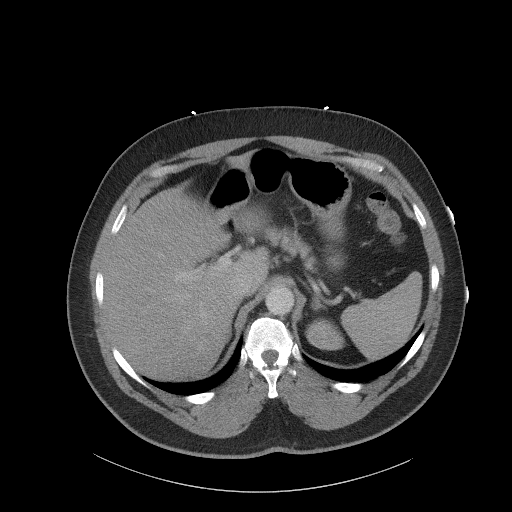
[im 82/97  soft-tissue]
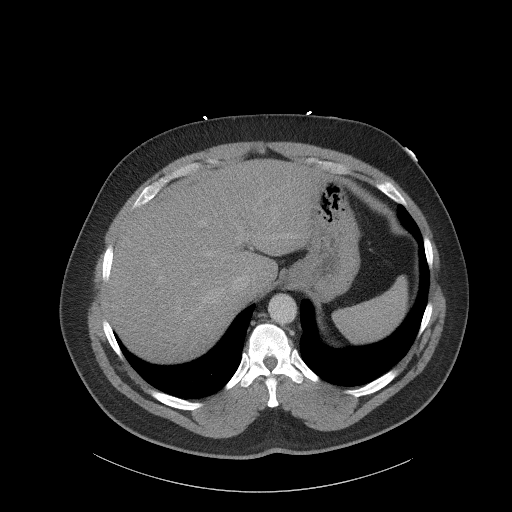
[im 92/97  soft-tissue]
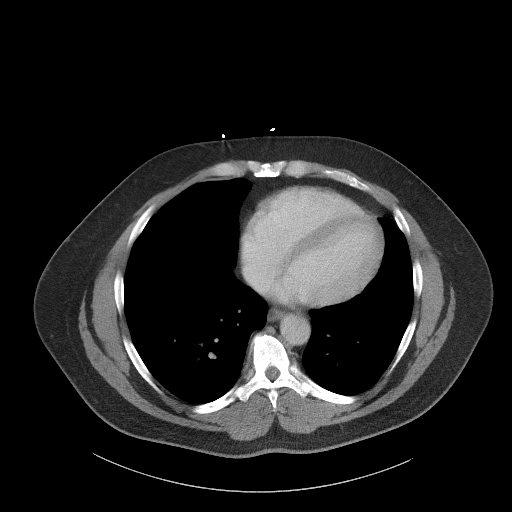

[Series 6: a/p w/ cor · coronal · 0.89mm/px · 3 of 151 slices shown]
[im 51/151  soft-tissue]
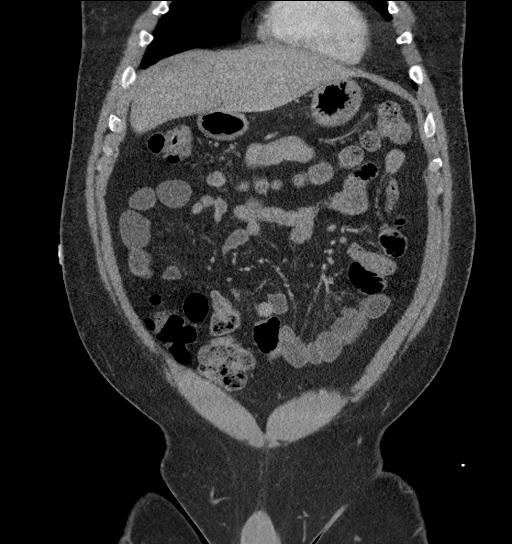
[im 67/151  soft-tissue]
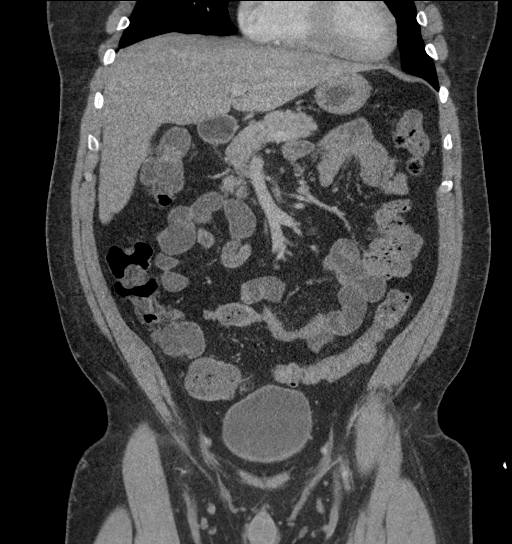
[im 84/151  soft-tissue]
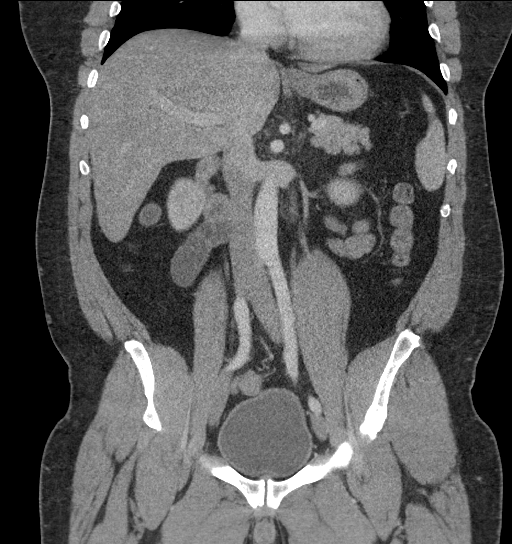

[16 of 46 positions shown; findings below may reference images not displayed]

FINDINGS: Lower chest: No acute abnormality.

Hepatobiliary: Ill-defined right hepatic hypodensity measuring
approximately 3.5 x 2.8 x 3.9 cm, stable in appearance since 0834
consistent with a benign finding likely hemangioma given gradual
peripheral puddling enhancement suggested on repeat delayed imaging
through the kidneys which include this lesion. Cholecystectomy. No
biliary dilatation.

Pancreas: Unremarkable. No pancreatic ductal dilatation or
surrounding inflammatory changes.

Spleen: Normal in size without focal abnormality.

Adrenals/Urinary Tract: Small low-density adrenal nodules similar in
appearance to prior consistent with adenomata, the largest is seen
on the right measuring 12 mm. Symmetric cortical enhancement of both
kidneys with symmetric pyelograms on repeat delayed imaging. The
ureters are unremarkable. The bladder is normal.

Stomach/Bowel: Stomach is within normal limits. Appendix appears
normal. Mild fluid-filled distention of small bowel without
mechanical bowel obstruction query enteritis. Unremarkable colon
without inflammation or obstruction. A few scattered colonic
diverticula are noted in the sigmoid.

Vascular/Lymphatic: No significant vascular findings are present. No
enlarged abdominal or pelvic lymph nodes.

Reproductive: Prostate is unremarkable.

Other: No abdominal wall hernia or abnormality. No abdominopelvic
ascites.

Musculoskeletal: No acute or significant osseous findings.
IMPRESSION: 1. Stable ill-defined right hepatic hypodensity measuring 3.5 x
x 3.9 cm consistent with a benign finding likely hemangioma given
gradual peripheral puddling enhancement suggested on repeat delayed
imaging.
2. Stable small low-density adrenal nodules consistent with
adenomas.
3. Mild fluid-filled distention of small bowel without mechanical
obstruction query enteritis.

## 2019-06-25 ENCOUNTER — Telehealth: Payer: Self-pay | Admitting: Internal Medicine

## 2019-06-25 NOTE — Telephone Encounter (Signed)
Pt was referred to [facility]. He is calling wanting to know if he should have recall colon done prior to 2024. Reports his maternal grandfather had colon cancer, 2 of his mothers brothers had colon cancer and someone on his fathers side also had colon cancer. Also wants to know if he should be posing these questions to [facility] or will he be having the recall colons done with Dr. Hilarie Fredrickson. Please advise.

## 2019-06-26 NOTE — Telephone Encounter (Signed)
Certainly okay to have his colonoscopies for screening done here.  I can forward results to his hepatology doctors at [facility] where he is seen for fatty liver disease.  With the strong family history you documented of colon cancer with multiple second-degree relatives I would recommend a 5-year screening interval. He had a normal colonoscopy in 2014 and so therefore it would be reasonable to repeat at this time  Screening colonoscopy can be scheduled in the [facility] if he is agreeable

## 2019-06-29 ENCOUNTER — Encounter: Payer: Self-pay | Admitting: Internal Medicine

## 2019-06-29 NOTE — Telephone Encounter (Signed)
Please see note below and schedule pt for screening colon in the [place].

## 2019-06-29 NOTE — Telephone Encounter (Signed)
LM on vmail to call back to schedule colonoscopy

## 2019-07-06 ENCOUNTER — Other Ambulatory Visit: Payer: Self-pay

## 2019-07-06 ENCOUNTER — Encounter (INDEPENDENT_AMBULATORY_CARE_PROVIDER_SITE_OTHER): Payer: 59 | Admitting: Ophthalmology

## 2019-07-06 DIAGNOSIS — H33301 Unspecified retinal break, right eye: Secondary | ICD-10-CM

## 2019-07-06 DIAGNOSIS — H35033 Hypertensive retinopathy, bilateral: Secondary | ICD-10-CM | POA: Diagnosis not present

## 2019-07-06 DIAGNOSIS — I10 Essential (primary) hypertension: Secondary | ICD-10-CM

## 2019-07-06 DIAGNOSIS — H4423 Degenerative myopia, bilateral: Secondary | ICD-10-CM

## 2019-07-06 DIAGNOSIS — H338 Other retinal detachments: Secondary | ICD-10-CM

## 2019-07-06 DIAGNOSIS — H43813 Vitreous degeneration, bilateral: Secondary | ICD-10-CM

## 2019-07-08 ENCOUNTER — Other Ambulatory Visit: Payer: Self-pay

## 2019-07-08 ENCOUNTER — Ambulatory Visit (AMBULATORY_SURGERY_CENTER): Payer: Self-pay

## 2019-07-08 VITALS — Temp 98.7°F | Ht 71.0 in | Wt 279.8 lb

## 2019-07-08 DIAGNOSIS — Z1211 Encounter for screening for malignant neoplasm of colon: Secondary | ICD-10-CM

## 2019-07-08 DIAGNOSIS — Z01818 Encounter for other preprocedural examination: Secondary | ICD-10-CM

## 2019-07-08 MED ORDER — NA SULFATE-K SULFATE-MG SULF 17.5-3.13-1.6 GM/177ML PO SOLN: 1.0000 | Freq: Once | ORAL | 0 refills | Status: AC

## 2019-07-08 NOTE — Progress Notes (Signed)

## 2019-07-20 ENCOUNTER — Encounter: Payer: Self-pay | Admitting: Internal Medicine

## 2019-07-21 ENCOUNTER — Other Ambulatory Visit: Payer: Self-pay | Admitting: Internal Medicine

## 2019-07-21 ENCOUNTER — Ambulatory Visit (INDEPENDENT_AMBULATORY_CARE_PROVIDER_SITE_OTHER): Payer: 59

## 2019-07-21 DIAGNOSIS — Z1159 Encounter for screening for other viral diseases: Secondary | ICD-10-CM

## 2019-07-22 LAB — SARS CORONAVIRUS 2 (TAT 6-24 HRS): SARS Coronavirus 2: NEGATIVE

## 2019-07-23 ENCOUNTER — Encounter: Payer: 59 | Admitting: Internal Medicine

## 2019-08-03 ENCOUNTER — Other Ambulatory Visit: Payer: Self-pay

## 2019-08-03 ENCOUNTER — Ambulatory Visit (AMBULATORY_SURGERY_CENTER): Payer: 59 | Admitting: Internal Medicine

## 2019-08-03 ENCOUNTER — Encounter: Payer: Self-pay | Admitting: Internal Medicine

## 2019-08-03 VITALS — BP 113/70 | HR 68 | Temp 97.3°F | Resp 19 | Ht 71.0 in | Wt 279.8 lb

## 2019-08-03 DIAGNOSIS — Z86010 Personal history of colonic polyps: Secondary | ICD-10-CM

## 2019-08-03 DIAGNOSIS — Z1211 Encounter for screening for malignant neoplasm of colon: Secondary | ICD-10-CM

## 2019-08-03 MED ORDER — SODIUM CHLORIDE 0.9 % IV SOLN: 500.0000 mL | Freq: Once | INTRAVENOUS | Status: DC

## 2019-08-03 NOTE — Progress Notes (Signed)
T-LC VS-KA  Pt's states no medical or surgical changes since previsit or office visit.

## 2019-08-03 NOTE — Progress Notes (Signed)
To PACU, VSS. Report to [email or web address] 

## 2019-08-03 NOTE — Patient Instructions (Signed)
You may resume your previous diet and medication schedule.  Thank you for allowing [place] to care for you today!!!   YOU HAD AN ENDOSCOPIC PROCEDURE TODAY AT [place]:   Refer to the procedure report that was given to you for any specific questions about what was found during the examination.  If the procedure report does not answer your questions, please call your gastroenterologist to clarify.  If you requested that your care partner not be given the details of your procedure findings, then the procedure report has been included in a sealed envelope for you to review at your convenience later.  YOU SHOULD EXPECT: Some feelings of bloating in the abdomen. Passage of more gas than usual.  Walking can help get rid of the air that was put into your GI tract during the procedure and reduce the bloating. If you had a lower endoscopy (such as a colonoscopy or flexible sigmoidoscopy) you may notice spotting of blood in your stool or on the toilet paper. If you underwent a bowel prep for your procedure, you may not have a normal bowel movement for a few days.  Please Note:  You might notice some irritation and congestion in your nose or some drainage.  This is from the oxygen used during your procedure.  There is no need for concern and it should clear up in a day or so.  SYMPTOMS TO REPORT IMMEDIATELY:   Following lower endoscopy (colonoscopy or flexible sigmoidoscopy):  Excessive amounts of blood in the stool  Significant tenderness or worsening of abdominal pains  Swelling of the abdomen that is new, acute  Fever of 100F or higher  For urgent or emergent issues, a gastroenterologist can be reached at any hour by calling [phone].   DIET:  We do recommend a small meal at first, but then you may proceed to your regular diet.  Drink plenty of fluids but you should avoid alcoholic beverages for 24 hours.  ACTIVITY:  You should plan to take it easy for the rest of today and you  should NOT DRIVE or use heavy machinery until tomorrow (because of the sedation medicines used during the test).    FOLLOW UP: Our staff will call the number listed on your records 48-72 hours following your procedure to check on you and address any questions or concerns that you may have regarding the information given to you following your procedure. If we do not reach you, we will leave a message.  We will attempt to reach you two times.  During this call, we will ask if you have developed any symptoms of COVID 19. If you develop any symptoms (ie: fever, flu-like symptoms, shortness of breath, cough etc.) before then, please call [phone].  If you test positive for Covid 19 in the 2 weeks post procedure, please call and report this information to [place].    If any biopsies were taken you will be contacted by phone or by letter within the next 1-3 weeks.  Please call us at [phone] if you have not heard about the biopsies in 3 weeks.    SIGNATURES/CONFIDENTIALITY: You and/or your care partner have signed paperwork which will be entered into your electronic medical record.  These signatures attest to the fact that that the information above on your After Visit Summary has been reviewed and is understood.  Full responsibility of the confidentiality of this discharge information lies with you and/or your care-partner.

## 2019-08-03 NOTE — Op Note (Signed)
[name] Procedure Date: 08/03/2019 3:58 PM MRN: [id number] Endoscopist: Jerene Bears , MD Age: 49 Referring MD:  Date of Birth: 19-Sep-1970 Gender: Male Account #: [id number] Procedure:                Colonoscopy Indications:              Colon cancer screening in patient at increased                            risk: Family history of colorectal cancer in                            multiple 2nd degree relatives Medicines:                Monitored Anesthesia Care Procedure:                Pre-Anesthesia Assessment:                           - Prior to the procedure, a History and Physical                            was performed, and patient medications and                            allergies were reviewed. The patient's tolerance of                            previous anesthesia was also reviewed. The risks                            and benefits of the procedure and the sedation                            options and risks were discussed with the patient.                            All questions were answered, and informed consent                            was obtained. Prior Anticoagulants: The patient has                            taken no previous anticoagulant or antiplatelet                            agents. ASA Grade Assessment: III - A patient with                            severe systemic disease. After reviewing the risks                            and benefits, the patient was deemed in  satisfactory condition to undergo the procedure.                           After obtaining informed consent, the colonoscope                            was passed under direct vision. Throughout the                            procedure, the patient's blood pressure, pulse, and                            oxygen saturations were monitored continuously. The                            Colonoscope was introduced through the  anus and                            advanced to the cecum, identified by appendiceal                            orifice and ileocecal valve. The colonoscopy was                            performed without difficulty. The patient tolerated                            the procedure well. The quality of the bowel                            preparation was good. The ileocecal valve,                            appendiceal orifice, and rectum were photographed. Scope In: 4:08:20 PM Scope Out: 4:20:29 PM Scope Withdrawal Time: 0 hours 9 minutes 33 seconds  Total Procedure Duration: 0 hours 12 minutes 9 seconds  Findings:                 The digital rectal exam was normal.                           Scattered small-mouthed diverticula were found in                            the sigmoid colon and descending colon.                           The exam was otherwise without abnormality on                            direct and retroflexion views. Complications:            No immediate complications. Estimated Blood Loss:     Estimated blood loss: none. Impression:               - Diverticulosis in  the sigmoid colon and in the                            descending colon.                           - The examination was otherwise normal on direct                            and retroflexion views.                           - No specimens collected. Recommendation:           - Patient has a contact number available for                            emergencies. The signs and symptoms of potential                            delayed complications were discussed with the                            patient. Return to normal activities tomorrow.                            Written discharge instructions were provided to the                            patient.                           - Resume previous diet.                           - Continue present medications.                           - Repeat colonoscopy  in 5 years for screening                            purposes. Jerene Bears, MD 08/03/2019 4:27:59 PM This report has been signed electronically.

## 2019-08-05 ENCOUNTER — Telehealth: Payer: Self-pay | Admitting: *Deleted

## 2019-08-05 NOTE — Telephone Encounter (Signed)
Message left

## 2019-08-05 NOTE — Telephone Encounter (Signed)
  Follow up Call-  Call back number 08/03/2019  Post procedure Call Back phone  # [phone]  Permission to leave phone message Yes  Some recent data might be hidden     Patient questions:  Do you have a fever, pain , or abdominal swelling? No. Pain Score  0 *  Have you tolerated food without any problems? Yes.    Have you been able to return to your normal activities? Yes.    Do you have any questions about your discharge instructions: Diet   No. Medications  No. Follow up visit  No.  Do you have questions or concerns about your Care? No.  Actions: * If pain score is 4 or above: No action needed, pain <4.  1. Have you developed a fever since your procedure? no  2.   Have you had an respiratory symptoms (SOB or cough) since your procedure? no  3.   Have you tested positive for COVID 19 since your procedure no  4.   Have you had any family members/close contacts diagnosed with the COVID 19 since your procedure?  no   If yes to any of these questions please route to Joylene John, RN and Alphonsa Gin, Therapist, sports.

## 2019-10-15 DIAGNOSIS — M7660 Achilles tendinitis, unspecified leg: Secondary | ICD-10-CM | POA: Insufficient documentation

## 2019-10-15 HISTORY — DX: Achilles tendinitis, unspecified leg: M76.60

## 2019-11-03 DIAGNOSIS — M79672 Pain in left foot: Secondary | ICD-10-CM | POA: Insufficient documentation

## 2019-11-03 HISTORY — DX: Pain in left foot: M79.672

## 2020-02-03 HISTORY — PX: ACHILLES TENDON REPAIR: SUR1153

## 2020-07-06 ENCOUNTER — Other Ambulatory Visit: Payer: Self-pay

## 2020-07-06 ENCOUNTER — Encounter (INDEPENDENT_AMBULATORY_CARE_PROVIDER_SITE_OTHER): Payer: 59 | Admitting: Ophthalmology

## 2020-07-06 DIAGNOSIS — H4423 Degenerative myopia, bilateral: Secondary | ICD-10-CM | POA: Diagnosis not present

## 2020-07-06 DIAGNOSIS — H43813 Vitreous degeneration, bilateral: Secondary | ICD-10-CM

## 2020-07-06 DIAGNOSIS — I10 Essential (primary) hypertension: Secondary | ICD-10-CM

## 2020-07-06 DIAGNOSIS — H35033 Hypertensive retinopathy, bilateral: Secondary | ICD-10-CM

## 2020-07-06 DIAGNOSIS — H33301 Unspecified retinal break, right eye: Secondary | ICD-10-CM | POA: Diagnosis not present

## 2020-08-31 ENCOUNTER — Ambulatory Visit (INDEPENDENT_AMBULATORY_CARE_PROVIDER_SITE_OTHER): Payer: 59

## 2020-08-31 ENCOUNTER — Encounter: Payer: Self-pay | Admitting: Podiatry

## 2020-08-31 ENCOUNTER — Ambulatory Visit: Payer: 59 | Admitting: Podiatry

## 2020-08-31 ENCOUNTER — Other Ambulatory Visit: Payer: Self-pay

## 2020-08-31 DIAGNOSIS — G629 Polyneuropathy, unspecified: Secondary | ICD-10-CM

## 2020-08-31 DIAGNOSIS — M79671 Pain in right foot: Secondary | ICD-10-CM

## 2020-08-31 DIAGNOSIS — M79672 Pain in left foot: Secondary | ICD-10-CM

## 2020-08-31 MED ORDER — GABAPENTIN 300 MG PO CAPS: 300.0000 mg | ORAL_CAPSULE | Freq: Three times a day (TID) | ORAL | 3 refills | Status: DC

## 2020-09-01 NOTE — Progress Notes (Signed)
Subjective:   Patient ID: Andre Decker, male   DOB: 50 y.o.   MRN: [id number]   HPI Patient presents with tingling in his left foot for a year with concerns about this with history of moderate back problems and having had Achilles tendon repair left.  Patient does not smoke likes to be active if possible   Review of Systems  All other systems reviewed and are negative.       Objective:  Physical Exam Vitals and nursing note reviewed.  Constitutional:      Appearance: He is well-developed.  Pulmonary:     Effort: Pulmonary effort is normal.  Musculoskeletal:        General: Normal range of motion.  Skin:    General: Skin is warm.  Neurological:     Mental Status: He is alert.     Neurovascular status intact muscle strength found to be within normal limits with mild reduction of sharp dull vibratory left.  No indications muscle strength loss or DT reflex deficit with history of Achilles tendon repair left primary with partial tear.  Good digital perfusion well oriented x3     Assessment:  Possibility for low-grade neuropathy which may be idiopathic or possibility for issue related to back compression or other pathology      Plan:  H&P x-rays reviewed condition discussed and we will start him on gabapentin to see whether or not this will alleviate symptoms and if not I will consider neurological consult.  Also discussed possibility for nerve biopsies and reviewed different causes of neuropathy and things to watch out for.  No tripping at this time or weakness so we will just put him on watch  X-rays indicate that there is no signs of bony injury or pathology as far as arthritis

## 2020-09-11 ENCOUNTER — Emergency Department (HOSPITAL_BASED_OUTPATIENT_CLINIC_OR_DEPARTMENT_OTHER)
Admission: EM | Admit: 2020-09-11 | Discharge: 2020-09-11 | Disposition: A | Discharge status: Home / Self Care | Payer: 59 | Attending: Emergency Medicine | Admitting: Emergency Medicine

## 2020-09-11 ENCOUNTER — Encounter (HOSPITAL_BASED_OUTPATIENT_CLINIC_OR_DEPARTMENT_OTHER): Payer: Self-pay

## 2020-09-11 ENCOUNTER — Other Ambulatory Visit: Payer: Self-pay

## 2020-09-11 ENCOUNTER — Emergency Department (HOSPITAL_BASED_OUTPATIENT_CLINIC_OR_DEPARTMENT_OTHER): Payer: 59

## 2020-09-11 DIAGNOSIS — Z79899 Other long term (current) drug therapy: Secondary | ICD-10-CM | POA: Insufficient documentation

## 2020-09-11 DIAGNOSIS — X501XXA Overexertion from prolonged static or awkward postures, initial encounter: Secondary | ICD-10-CM | POA: Diagnosis not present

## 2020-09-11 DIAGNOSIS — S93401A Sprain of unspecified ligament of right ankle, initial encounter: Secondary | ICD-10-CM | POA: Diagnosis not present

## 2020-09-11 DIAGNOSIS — I10 Essential (primary) hypertension: Secondary | ICD-10-CM | POA: Insufficient documentation

## 2020-09-11 DIAGNOSIS — Y9381 Activity, refereeing a sports activity: Secondary | ICD-10-CM | POA: Diagnosis not present

## 2020-09-11 DIAGNOSIS — S99911A Unspecified injury of right ankle, initial encounter: Secondary | ICD-10-CM | POA: Diagnosis present

## 2020-09-11 NOTE — ED Triage Notes (Signed)
Patient here POV from Home with Right Ankle Pain.  Patient was referring a [employer] when another player came at him and Patient twisting right ankle avoiding contact.   No other pain. No Head Injury. No medications PTA.

## 2020-09-11 NOTE — Discharge Instructions (Addendum)
Apply ice to help with swelling.  Use the splint for support.  Follow-up with an orthopedic doctor if the symptoms persist.  Take over-the-counter medications as needed for pain

## 2020-09-11 NOTE — ED Provider Notes (Signed)
[facility] EMERGENCY DEPT Provider Note   CSN: [id number] Arrival date & time: 09/11/20  2112     History Chief Complaint  Patient presents with  . Ankle Injury    Andre Decker is a 50 y.o. male.  HPI   Patient presented to the ED for evaluation of right ankle pain.  Patient was a Conservation officer, historic buildings for basketball game.  Patient ended up twisting his ankle as he was trying to get away from a player.  Someone at the game was able to tape up his ankle.  Since then he has been having some persistent pain in his ankle.  No other injuries.  Past Medical History:  Diagnosis Date  . 1st degree AV block   . Biliary colic   . Glaucoma   . Glaucoma of both eyes   . Hypertension   . IBS (irritable bowel syndrome)    chronic  . IT band syndrome   . Liver hemangioma    dx 2002  . OSA on CPAP    moderate osa per study 05-21-2004  . Osteoarthritis   . Sleep apnea   . Wears contact lenses     Patient Active Problem List   Diagnosis Date Noted  . 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 Biliary colic 46/96/2952  . Bradycardia 09/26/2016  . Paresthesia of left foot 04/20/2015  . IRRITABLE BOWEL SYNDROME 05/23/2010  . OBESITY, UNSPECIFIED 03/15/2009  . HEMANGIOMA, HEPATIC 09/06/2007  . HYPERTENSION 09/06/2007    Past Surgical History:  Procedure Laterality Date  . ACHILLES TENDON REPAIR Left 02/2020  . CATARACT EXTRACTION W/ INTRAOCULAR LENS IMPLANT Left 01/18/2012  . CHOLECYSTECTOMY N/A 11/01/2016   Procedure: LAPAROSCOPIC CHOLECYSTECTOMY;  Surgeon: Kinsinger, Arta Bruce, MD;  Location: [facility];  Service: General;  Laterality: N/A;  WILL NEED ANESTHESIA CONSULT PRIOR TO SURGERY - SEE NOTE IN SPECIAL NEEDS SECTION  . COLONOSCOPY  2014  . COLONOSCOPY WITH ESOPHAGOGASTRODUODENOSCOPY (EGD)  last one 11-24-2012  . RETINAL DETACHMENT REPAIR W/ SCLERAL BUCKLE LE Bilateral 09/23/2009   Left eye and Laser for Retinal breaks Right eye  . UPPER ESOPHAGEAL ENDOSCOPIC ULTRASOUND (EUS)   2017    [facility]       Family History  Problem Relation Age of Onset  . Migraines Mother   . Prostate cancer Maternal Grandfather   . Prostate cancer Maternal Uncle   . Arthritis/Rheumatoid Maternal Grandmother   . Cancer Maternal Grandmother        unknown type  . Arthritis/Rheumatoid Paternal Grandmother   . Stroke Paternal Grandfather   . Stomach cancer Neg Hx   . Colon cancer Neg Hx   . Colon polyps Neg Hx   . Rectal cancer Neg Hx   . Esophageal cancer Neg Hx     Social History   Tobacco Use  . Smoking status: Never Smoker  . Smokeless tobacco: Never Used  Vaping Use  . Vaping Use: Never used  Substance Use Topics  . Alcohol use: No    Alcohol/week: 0.0 standard drinks  . Drug use: No    Home Medications Prior to Admission medications   Medication Sig Start Date End Date Taking? Authorizing Provider  amLODipine (NORVASC) 5 MG tablet Take 5 mg by mouth daily.    [provider]  AZOPT 1 % ophthalmic suspension Place 1 drop into both eyes 2 (two) times daily. 05/27/14   [provider]  bimatoprost (LUMIGAN) 0.01 % SOLN Place 1 drop into both eyes at bedtime.  [provider]  brimonidine-timolol (COMBIGAN) 0.2-0.5 % ophthalmic solution Place 1 drop into both eyes 2 (two) times daily.     [provider]  Cholecalciferol 25 MCG (1000 UT) capsule Take by mouth.    [provider]  desonide (DESOWEN) 0.05 % cream Apply 1 application topically daily as needed (rash).    [provider]  dicyclomine (BENTYL) 20 MG tablet Take 1 tablet (20 mg total) by mouth 4 (four) times daily -  before meals and at bedtime for 10 days. 07/23/18 08/02/18  Lorin Glass, PA-C  Fluocinolone Acetonide Scalp 0.01 % OIL Apply 1 application topically 2 (two) times a week.    [provider]  gabapentin (NEURONTIN) 300 MG capsule Take 1 capsule (300 mg total) by mouth 3 (three) times daily. 08/31/20   Wallene Huh, DPM   ketoconazole (NIZORAL) 2 % cream Apply 1 application topically daily.    [provider]  ketoconazole (NIZORAL) 2 % shampoo Apply 1 application topically 2 (two) times a week.    [provider]  losartan-hydrochlorothiazide (HYZAAR) 50-12.5 MG tablet losartan 50 mg-hydrochlorothiazide 12.5 mg tablet    [provider]  Multiple Vitamin (MULTI-VITAMIN) tablet Take by mouth.    [provider]  ondansetron (ZOFRAN ODT) 4 MG disintegrating tablet Take 1 tablet (4 mg total) by mouth every 8 (eight) hours as needed for nausea or vomiting. 07/23/18   Lorin Glass, PA-C  RHOPRESSA 0.02 % SOLN Place 1 drop into both eyes daily. 06/18/18   [provider]    Allergies    Blood-group specific substance  Review of Systems   Review of Systems  All other systems reviewed and are negative.   Physical Exam Updated Vital Signs BP 130/87 (BP Location: Right Arm)   Pulse 75   Temp 98.4 F (36.9 C) (Oral)   Resp 16   Ht 1.854 m (6\' 1" )   Wt 127 kg   SpO2 97%   BMI 36.94 kg/m   Physical Exam Vitals and nursing note reviewed.  Constitutional:      General: He is not in acute distress.    Appearance: He is well-developed.  HENT:     Head: Normocephalic and atraumatic.     Right Ear: External ear normal.     Left Ear: External ear normal.  Eyes:     General: No scleral icterus.       Right eye: No discharge.        Left eye: No discharge.     Conjunctiva/sclera: Conjunctivae normal.  Neck:     Trachea: No tracheal deviation.  Cardiovascular:     Rate and Rhythm: Normal rate.  Pulmonary:     Effort: Pulmonary effort is normal. No respiratory distress.     Breath sounds: No stridor.  Abdominal:     General: There is no distension.  Musculoskeletal:        General: Tenderness present. No swelling or deformity.     Cervical back: Neck supple.     Comments: No tenderness proximal fibular or knee, no tenderness palpation of the foot,  tenderness palpation medial lateral malleolus of the right ankle  Skin:    General: Skin is warm and dry.     Findings: No rash.  Neurological:     Mental Status: He is alert.     Cranial Nerves: Cranial nerve deficit: no gross deficits.     ED Results / Procedures / Treatments   Labs (all  labs ordered are listed, but only abnormal results are displayed) Labs Reviewed - No data to display  EKG None  Radiology DG Ankle Complete Right  Result Date: 09/11/2020 CLINICAL DATA:  50 year old male with right ankle pain. EXAM: RIGHT ANKLE - COMPLETE 3+ VIEW COMPARISON:  None. FINDINGS: There is no acute fracture or dislocation. The bones are well mineralized. No significant arthritic changes. The ankle mortise is intact. The soft tissues are unremarkable IMPRESSION: No acute fracture or dislocation. Electronically Signed   By: Anner Crete M.D.   On: 09/11/2020 21:58    Procedures Procedures   Medications Ordered in ED Medications - No data to display  ED Course  I have reviewed the triage vital signs and the nursing notes.  Pertinent labs & imaging results that were available during my care of the patient were reviewed by me and considered in my medical decision making (see chart for details).    MDM Rules/Calculators/A&P                           No signs of fracture on x-ray.  Consistent with ankle sprain.  Will provide ASO splint.  Patient has crutches at home Final Clinical Impression(s) / ED Diagnoses Final diagnoses:  Sprain of right ankle, unspecified ligament, initial encounter    Rx / DC Orders ED Discharge Orders    None       Dorie Rank, MD 09/11/20 2313

## 2020-11-02 ENCOUNTER — Other Ambulatory Visit: Payer: Self-pay | Admitting: Family Medicine

## 2020-11-02 ENCOUNTER — Ambulatory Visit
Admission: RE | Admit: 2020-11-02 | Discharge: 2020-11-02 | Disposition: A | Discharge status: Home / Self Care | Payer: 59 | Source: Ambulatory Visit | Attending: Family Medicine | Admitting: Family Medicine

## 2020-11-02 ENCOUNTER — Ambulatory Visit: Payer: 59 | Admitting: Podiatry

## 2020-11-02 DIAGNOSIS — S6991XA Unspecified injury of right wrist, hand and finger(s), initial encounter: Secondary | ICD-10-CM

## 2020-11-09 ENCOUNTER — Other Ambulatory Visit: Payer: Self-pay

## 2020-11-09 ENCOUNTER — Ambulatory Visit (INDEPENDENT_AMBULATORY_CARE_PROVIDER_SITE_OTHER): Payer: 59 | Admitting: Podiatry

## 2020-11-09 ENCOUNTER — Encounter: Payer: Self-pay | Admitting: Podiatry

## 2020-11-09 DIAGNOSIS — G629 Polyneuropathy, unspecified: Secondary | ICD-10-CM

## 2020-11-09 DIAGNOSIS — J309 Allergic rhinitis, unspecified: Secondary | ICD-10-CM | POA: Insufficient documentation

## 2020-11-09 DIAGNOSIS — N529 Male erectile dysfunction, unspecified: Secondary | ICD-10-CM | POA: Insufficient documentation

## 2020-11-09 DIAGNOSIS — K828 Other specified diseases of gallbladder: Secondary | ICD-10-CM

## 2020-11-09 DIAGNOSIS — R7303 Prediabetes: Secondary | ICD-10-CM | POA: Insufficient documentation

## 2020-11-09 DIAGNOSIS — G4733 Obstructive sleep apnea (adult) (pediatric): Secondary | ICD-10-CM | POA: Insufficient documentation

## 2020-11-09 DIAGNOSIS — M79672 Pain in left foot: Secondary | ICD-10-CM | POA: Diagnosis not present

## 2020-11-09 DIAGNOSIS — M79671 Pain in right foot: Secondary | ICD-10-CM | POA: Diagnosis not present

## 2020-11-09 DIAGNOSIS — M5430 Sciatica, unspecified side: Secondary | ICD-10-CM

## 2020-11-09 DIAGNOSIS — E7800 Pure hypercholesterolemia, unspecified: Secondary | ICD-10-CM | POA: Insufficient documentation

## 2020-11-09 HISTORY — DX: Pure hypercholesterolemia, unspecified: E78.00

## 2020-11-09 HISTORY — DX: Male erectile dysfunction, unspecified: N52.9

## 2020-11-09 HISTORY — DX: Sciatica, unspecified side: M54.30

## 2020-11-09 HISTORY — DX: Allergic rhinitis, unspecified: J30.9

## 2020-11-09 HISTORY — DX: Prediabetes: R73.03

## 2020-11-09 HISTORY — DX: Other specified diseases of gallbladder: K82.8

## 2020-11-09 HISTORY — DX: Obstructive sleep apnea (adult) (pediatric): G47.33

## 2020-11-09 NOTE — Progress Notes (Signed)
Subjective:   Patient ID: Andre Decker, male   DOB: 50 y.o.   MRN: [id number]   HPI Patient presents stating he is improving but continues to have some discomfort in his left foot and nervelike pain   ROS      Objective:  Physical Exam  Neurovascular status intact with patient's left foot continuing to show mild symptoms but improvement with neuropathic-like symptomatology     Assessment:  Improvement with utilization of gabapentin left foot     Plan:  H&P reviewed condition do not recommend further treatment but take 3 gabapentin a day we may have to increase the dose someday and I advised him if he is not getting good relief to contact us and we will try to increase the dose and see if we can get him pain-free.  Also discussed the possibility of back being part of his problem

## 2021-02-20 ENCOUNTER — Other Ambulatory Visit: Payer: Self-pay

## 2021-02-20 ENCOUNTER — Ambulatory Visit: Payer: 59 | Admitting: Podiatry

## 2021-02-20 ENCOUNTER — Encounter: Payer: Self-pay | Admitting: Podiatry

## 2021-02-20 DIAGNOSIS — M79671 Pain in right foot: Secondary | ICD-10-CM | POA: Diagnosis not present

## 2021-02-20 DIAGNOSIS — G629 Polyneuropathy, unspecified: Secondary | ICD-10-CM | POA: Diagnosis not present

## 2021-02-20 DIAGNOSIS — M79672 Pain in left foot: Secondary | ICD-10-CM

## 2021-02-20 NOTE — Progress Notes (Signed)
Subjective:   Patient ID: Andre Decker, male   DOB: 50 y.o.   MRN: [id number]   HPI Patient continues to experience tingling burning left foot and states that the medicine that he is on is only been minimally effective and that he has seen a neurologist to give him a diagnosis of neuropathy and it is bothersome for him for the last several years   ROS      Objective:  Physical Exam  No change vascular status diminishment of sharp dull vibratory bilateral with patient who has symptoms which occur across the entire bottom of his left foot and has a negative Tinel's sign currently     Assessment:  Chronic neuropathy left foot failure to respond to medication and physical therapy shoe gear modifications.     Plan:  I discussed this with patient at great length.  I think the consideration there is to try to utilize [place] therapy which may help alleviate the neuropathy that the patient is experiencing. Educated him on this and will get scheduled

## 2021-02-23 ENCOUNTER — Other Ambulatory Visit: Payer: Self-pay | Admitting: Podiatry

## 2021-07-06 ENCOUNTER — Other Ambulatory Visit: Payer: Self-pay

## 2021-07-06 ENCOUNTER — Encounter (INDEPENDENT_AMBULATORY_CARE_PROVIDER_SITE_OTHER): Payer: 59 | Admitting: Ophthalmology

## 2021-07-06 DIAGNOSIS — H338 Other retinal detachments: Secondary | ICD-10-CM

## 2021-07-06 DIAGNOSIS — H35033 Hypertensive retinopathy, bilateral: Secondary | ICD-10-CM

## 2021-07-06 DIAGNOSIS — I10 Essential (primary) hypertension: Secondary | ICD-10-CM | POA: Diagnosis not present

## 2021-07-06 DIAGNOSIS — H43813 Vitreous degeneration, bilateral: Secondary | ICD-10-CM

## 2021-07-06 DIAGNOSIS — H4423 Degenerative myopia, bilateral: Secondary | ICD-10-CM

## 2021-07-06 DIAGNOSIS — H33301 Unspecified retinal break, right eye: Secondary | ICD-10-CM

## 2021-07-30 ENCOUNTER — Encounter (HOSPITAL_BASED_OUTPATIENT_CLINIC_OR_DEPARTMENT_OTHER): Payer: Self-pay

## 2021-07-30 ENCOUNTER — Emergency Department (HOSPITAL_BASED_OUTPATIENT_CLINIC_OR_DEPARTMENT_OTHER)
Admission: EM | Admit: 2021-07-30 | Discharge: 2021-07-31 | Disposition: A | Discharge status: Home / Self Care | Payer: 59 | Attending: Emergency Medicine | Admitting: Emergency Medicine

## 2021-07-30 ENCOUNTER — Other Ambulatory Visit: Payer: Self-pay

## 2021-07-30 DIAGNOSIS — R110 Nausea: Secondary | ICD-10-CM | POA: Insufficient documentation

## 2021-07-30 DIAGNOSIS — Z79899 Other long term (current) drug therapy: Secondary | ICD-10-CM | POA: Diagnosis not present

## 2021-07-30 DIAGNOSIS — I10 Essential (primary) hypertension: Secondary | ICD-10-CM | POA: Diagnosis not present

## 2021-07-30 DIAGNOSIS — R1084 Generalized abdominal pain: Secondary | ICD-10-CM | POA: Diagnosis not present

## 2021-07-30 DIAGNOSIS — R109 Unspecified abdominal pain: Secondary | ICD-10-CM

## 2021-07-30 LAB — COMPREHENSIVE METABOLIC PANEL
ALT: 29 U/L (ref 0–44)
AST: 23 U/L (ref 15–41)
Albumin: 4.2 g/dL (ref 3.5–5.0)
Alkaline Phosphatase: 46 U/L (ref 38–126)
Anion gap: 8 (ref 5–15)
BUN: 15 mg/dL (ref 6–20)
CO2: 25 mmol/L (ref 22–32)
Calcium: 9.6 mg/dL (ref 8.9–10.3)
Chloride: 108 mmol/L (ref 98–111)
Creatinine, Ser: 1.1 mg/dL (ref 0.61–1.24)
GFR, Estimated: >60 mL/min (ref >60)
Glucose, Bld: 116 mg/dL — ABNORMAL HIGH (ref 70–99)
Potassium: 3.6 mmol/L (ref 3.5–5.1)
Sodium: 141 mmol/L (ref 135–145)
Total Bilirubin: 0.9 mg/dL (ref 0.3–1.2)
Total Protein: 6.6 g/dL (ref 6.5–8.1)

## 2021-07-30 LAB — CBC WITH DIFFERENTIAL/PLATELET
Abs Immature Granulocytes: 0.03 10*3/uL (ref 0.00–0.07)
Basophils Absolute: 0 10*3/uL (ref 0.0–0.1)
Basophils Relative: 0 %
Eosinophils Absolute: 0 10*3/uL (ref 0.0–0.5)
Eosinophils Relative: 0 %
HCT: 39.6 % (ref 39.0–52.0)
Hemoglobin: 13 g/dL (ref 13.0–17.0)
Immature Granulocytes: 0 %
Lymphocytes Relative: 25 %
Lymphs Abs: 1.7 10*3/uL (ref 0.7–4.0)
MCH: 26.6 pg (ref 26.0–34.0)
MCHC: 32.8 g/dL (ref 30.0–36.0)
MCV: 81.1 fL (ref 80.0–100.0)
Monocytes Absolute: 0.5 10*3/uL (ref 0.1–1.0)
Monocytes Relative: 7 %
Neutro Abs: 4.6 10*3/uL (ref 1.7–7.7)
Neutrophils Relative %: 68 %
Platelets: 206 10*3/uL (ref 150–400)
RBC: 4.88 MIL/uL (ref 4.22–5.81)
RDW: 13.3 % (ref 11.5–15.5)
WBC: 6.9 10*3/uL (ref 4.0–10.5)
nRBC: 0 % (ref 0.0–0.2)

## 2021-07-30 LAB — LIPASE, BLOOD: Lipase: 14 U/L (ref 11–51)

## 2021-07-30 NOTE — ED Triage Notes (Signed)
Pt reports abdominal that started this evening Sharp and generalized +nausea Pt does have hx of IBS

## 2021-07-31 ENCOUNTER — Telehealth: Payer: Self-pay | Admitting: Internal Medicine

## 2021-07-31 ENCOUNTER — Emergency Department (HOSPITAL_BASED_OUTPATIENT_CLINIC_OR_DEPARTMENT_OTHER): Payer: 59

## 2021-07-31 LAB — URINALYSIS, ROUTINE W REFLEX MICROSCOPIC
Bilirubin Urine: NEGATIVE
Glucose, UA: NEGATIVE mg/dL
Hgb urine dipstick: NEGATIVE
Ketones, ur: NEGATIVE mg/dL
Leukocytes,Ua: NEGATIVE
Nitrite: NEGATIVE
Specific Gravity, Urine: 1.011 (ref 1.005–1.030)
pH: 5.5 (ref 5.0–8.0)

## 2021-07-31 MED ORDER — IOHEXOL 300 MG/ML  SOLN
100.0000 mL | Freq: Once | INTRAMUSCULAR | Status: AC | PRN
  Administered 2021-07-31: 100 mL via INTRAVENOUS

## 2021-07-31 MED ORDER — KETOROLAC TROMETHAMINE 30 MG/ML IJ SOLN
30.0000 mg | Freq: Once | INTRAMUSCULAR | Status: AC
  Administered 2021-07-31: 30 mg via INTRAVENOUS
  Filled 2021-07-31: qty 1

## 2021-07-31 MED ORDER — MORPHINE SULFATE (PF) 4 MG/ML IV SOLN
4.0000 mg | Freq: Once | INTRAVENOUS | Status: AC
  Administered 2021-07-31: 4 mg via INTRAVENOUS
  Filled 2021-07-31: qty 1

## 2021-07-31 MED ORDER — ONDANSETRON HCL 4 MG/2ML IJ SOLN
4.0000 mg | Freq: Once | INTRAMUSCULAR | Status: AC
  Administered 2021-07-31: 4 mg via INTRAVENOUS
  Filled 2021-07-31: qty 2

## 2021-07-31 MED ORDER — SODIUM CHLORIDE 0.9 % IV BOLUS
1000.0000 mL | Freq: Once | INTRAVENOUS | Status: AC
  Administered 2021-07-31: 1000 mL via INTRAVENOUS

## 2021-07-31 NOTE — Telephone Encounter (Signed)
Scheduled pt to see Nicoletta Ba PA 08/03/21 at 1:30pm. Please call pt and notify them of appt. See note below stating ok to schedule by Dr. Hilarie Fredrickson.

## 2021-07-31 NOTE — ED Notes (Signed)
Pt was able to ambulate to and from the bathroom without assistance. Verbalized improvement of pain. Rating pain 1 out of 10. EDP Delo informed.

## 2021-07-31 NOTE — ED Notes (Signed)
Discharge instructions discussed with pt. Pt verbalized understanding with no questions at this time. Pt to go home with spouse at bedside.

## 2021-07-31 NOTE — Telephone Encounter (Signed)
Called patient and made him aware of appointment.

## 2021-07-31 NOTE — Telephone Encounter (Signed)
Patient called wanting to make an appointment with Dr. Hilarie Fredrickson or with Nicoletta Ba for abd pain and cramping. Patient stated that he went to the ER last night and they wanted him to follow up with GI.   Patient was last seen with [place] on 3/21 for a procedure but last was seen 06/28/21 with [facility]. Looks like Dr. Hilarie Fredrickson referred him to [facility].   Can patient be seen with [place]?

## 2021-07-31 NOTE — Discharge Instructions (Signed)
Continue home medications as previously prescribed.  Follow-up with primary doctor in the next few days, and return to the ER if symptoms significantly worsen or change.

## 2021-07-31 NOTE — ED Notes (Signed)
Patient transported to CT 

## 2021-07-31 NOTE — ED Notes (Signed)
Rounded on pt d/t alarm PR 38. Pt placed on cardiac monitor. HR 30-50s. Pt states history of low heart rate in the past, denies CP/SOB/lightlessness/dizziness. EDP Delo informed. BP stable 132/84. No new orders

## 2021-07-31 NOTE — ED Notes (Signed)
RT placed pt on [place] 3 Lpm d/t desaturation when falling to sleep into the high 70's. Pt normally wears a cpap at home at night. Pt sats post placement 98%. Pt respiratory status stable w/no distress noted at this time. RT will continue to monitor.

## 2021-07-31 NOTE — ED Provider Notes (Signed)
[place] EMERGENCY DEPT Provider Note   CSN: [id number] Arrival date & time: 07/30/21  2147     History  Chief Complaint  Patient presents with   Abdominal Pain    Andre Decker is a 51 y.o. male.  Patient is a 51 year old male with past medical history of hypertension, irritable bowel, obstructive sleep apnea, hyperlipidemia.  Patient is also status post appendectomy and cholecystectomy.  He reports a history of flareups of abdominal pain felt to be related to irritable bowel, but Crohn's disease also suspected.  He is apparently taking part in some study at [facility] for his issues.  Patient started with abdominal cramping and pain earlier this evening.  He denies any fevers or chills.  He feels nauseated, but has not had any vomiting or diarrhea.  He has hycosamine at home, however this does not seem to help.  He denies having eaten any suspicious foods.  He denies any ill contacts.  The history is provided by the patient.  Abdominal Pain Pain location:  Generalized Pain quality: cramping   Pain radiates to:  Does not radiate Pain severity:  Moderate Onset quality:  Sudden Duration:  4 hours Timing:  Constant Progression:  Worsening Chronicity:  Recurrent Relieved by:  Nothing Worsened by:  Nothing     Home Medications Prior to Admission medications   Medication Sig Start Date End Date Taking? Authorizing Provider  amLODipine (NORVASC) 5 MG tablet Take 5 mg by mouth daily.    [provider]  AZOPT 1 % ophthalmic suspension Place 1 drop into both eyes 2 (two) times daily. 05/27/14   [provider]  bimatoprost (LUMIGAN) 0.01 % SOLN Place 1 drop into both eyes at bedtime.     [provider]  brimonidine-timolol (COMBIGAN) 0.2-0.5 % ophthalmic solution Place 1 drop into both eyes 2 (two) times daily.     [provider]  Cholecalciferol 25 MCG (1000 UT) capsule Take by mouth.    [provider]  desonide  (DESOWEN) 0.05 % cream Apply 1 application topically daily as needed (rash).    [provider]  dicyclomine (BENTYL) 20 MG tablet Take 1 tablet (20 mg total) by mouth 4 (four) times daily -  before meals and at bedtime for 10 days. 07/23/18 08/02/18  Lorin Glass, PA-C  Fluocinolone Acetonide Scalp 0.01 % OIL Apply 1 application topically 2 (two) times a week.    [provider]  gabapentin (NEURONTIN) 300 MG capsule TAKE 1 CAPSULE BY MOUTH THREE TIMES A DAY 02/23/21   Trula Slade, DPM  Hyoscyamine Sulfate SL 0.125 MG SUBL hyoscyamine 0.125 mg sublingual tablet    [provider]  ketoconazole (NIZORAL) 2 % cream Apply 1 application topically daily.    [provider]  ketoconazole (NIZORAL) 2 % shampoo Apply 1 application topically 2 (two) times a week.    [provider]  losartan-hydrochlorothiazide (HYZAAR) 50-12.5 MG tablet losartan 50 mg-hydrochlorothiazide 12.5 mg tablet    [provider]  methylPREDNISolone (MEDROL DOSEPAK) 4 MG TBPK tablet Take by mouth. 07/02/20   [provider]  Multiple Vitamin (MULTI-VITAMIN) tablet Take by mouth.    [provider]  nitroGLYCERIN (NITRODUR - DOSED IN MG/24 HR) 0.2 mg/hr patch nitroglycerin 0.2 mg/hr transdermal 24 hour patch  APPLY 1 PATCH EVERY DAY BY TRANSDERMAL ROUTE FOR 30 DAYS.    [provider]  Nutritional Supplements (MALE SUPPORT) CAPS See admin instructions.    [provider]  ondansetron (  ZOFRAN ODT) 4 MG disintegrating tablet Take 1 tablet (4 mg total) by mouth every 8 (eight) hours as needed for nausea or vomiting. 07/23/18   Lorin Glass, PA-C  oxyCODONE (OXY IR/ROXICODONE) 5 MG immediate release tablet oxycodone 5 mg tablet  TAKE 1 TABLET BY MOUTH EVERY 4 HOURS AS NEEDED FOR 5 DAYS    [provider]  predniSONE (STERAPRED UNI-PAK 21 TAB) 10 MG (21) TBPK tablet See admin instructions. 11/02/20   [provider]   RHOPRESSA 0.02 % SOLN Place 1 drop into both eyes daily. 06/18/18   [provider]  tadalafil (CIALIS) 5 MG tablet 1 tablet as needed 09/30/19   [provider]      Allergies    Blood-group specific substance and Lisinopril-hydrochlorothiazide    Review of Systems   Review of Systems  Gastrointestinal:  Positive for abdominal pain.   Physical Exam Updated Vital Signs BP (!) 164/107    Pulse (!) 54    Temp 98.6 F (37 C) (Oral)    Resp 20    Ht 5\' 11"  (1.803 m)    Wt 124.7 kg    SpO2 96%    BMI 38.35 kg/m  Physical Exam  ED Results / Procedures / Treatments   Labs (all labs ordered are listed, but only abnormal results are displayed) Labs Reviewed  COMPREHENSIVE METABOLIC PANEL - Abnormal; Notable for the following components:      Result Value   Glucose, Bld 116 (*)    All other components within normal limits  LIPASE, BLOOD  CBC WITH DIFFERENTIAL/PLATELET  URINALYSIS, ROUTINE W REFLEX MICROSCOPIC    EKG None  Radiology No results found.  Procedures Procedures    Medications Ordered in ED Medications  sodium chloride 0.9 % bolus 1,000 mL (has no administration in time range)  ondansetron (ZOFRAN) injection 4 mg (has no administration in time range)  morphine (PF) 4 MG/ML injection 4 mg (has no administration in time range)  ketorolac (TORADOL) 30 MG/ML injection 30 mg (has no administration in time range)    ED Course/ Medical Decision Making/ A&P  This patient presents to the ED for concern of abdominal pain, this involves an extensive number of treatment options, and is a complaint that carries with it a high risk of complications and morbidity.  The differential diagnosis includes small bowel obstruction, perforated viscus, acute cholecystitis, peptic ulcer disease, appendicitis, diverticulitis   Co morbidities that complicate the patient evaluation  None   Additional history obtained:  No additional history or external records  needed   Lab Tests:  I Ordered, and personally interpreted labs.  The pertinent results include: Unremarkable CBC, metabolic panel, liver functions, and lipase.   Imaging Studies ordered:  I ordered imaging studies including CT scan of the abdomen and pelvis I independently visualized and interpreted imaging which showed no acute process I agree with the radiologist interpretation   Cardiac Monitoring:  The patient was maintained on a cardiac monitor.  I personally viewed and interpreted the cardiac monitored which showed an underlying rhythm of: Sinus rhythm with episodes of sinus bradycardia while sleeping (patient states that this is normal for him).   Medicines ordered and prescription drug management:  I ordered medication including morphine for pain and Zofran for nausea Reevaluation of the patient after these medicines showed that the patient improved I have reviewed the patients home medicines and have made adjustments as needed   Test Considered:  No other test considered   Critical  Interventions:  IV fluids and pain medication   Consultations Obtained:  No consultations needed   Problem List / ED Course:  Patient with history of recurrent abdominal pain presenting with complaints of abdominal pain.  He describes his pain as severe and throughout his abdomen.  He is tender in all 4 quadrants, but laboratory studies and CT scan are unremarkable.  He reports a history of similar episodes in the past and has been told maybe he has irritable bowel or possibly even Crohn's disease.  Patient's presentation not consistent with Crohn's.  Patient feeling better after receiving medications here and I feel can safely be discharged to home.   Reevaluation:  After the interventions noted above, I reevaluated the patient and found that they have : Improved   Social Determinants of Health:  None   Dispostion:  After consideration of the diagnostic results and the  patients response to treatment, I feel that the patent would benefit from discharge to home.  Final Clinical Impression(s) / ED Diagnoses Final diagnoses:  None    Rx / DC Orders ED Discharge Orders     None         Veryl Speak, MD 07/31/21 [phone]

## 2021-07-31 NOTE — Telephone Encounter (Signed)
Yes, ok for an appt

## 2021-07-31 NOTE — Telephone Encounter (Signed)
Pt was seen in the ED yesterday for abd pain. He has been seen at [facility] regarding fatty liver. States he was told by the ER to follow-up with GI. Please advise if ok to schedule pt for follow-up here or if he needs to be seen at [facility]. ER note mentions a study at [facility]. Please advise.

## 2021-08-03 ENCOUNTER — Encounter: Payer: Self-pay | Admitting: Physician Assistant

## 2021-08-03 ENCOUNTER — Ambulatory Visit (INDEPENDENT_AMBULATORY_CARE_PROVIDER_SITE_OTHER): Payer: 59 | Admitting: Physician Assistant

## 2021-08-03 VITALS — BP 144/90 | HR 68 | Ht 71.0 in | Wt 284.2 lb

## 2021-08-03 DIAGNOSIS — K7581 Nonalcoholic steatohepatitis (NASH): Secondary | ICD-10-CM

## 2021-08-03 DIAGNOSIS — R1084 Generalized abdominal pain: Secondary | ICD-10-CM | POA: Diagnosis not present

## 2021-08-03 MED ORDER — PANTOPRAZOLE SODIUM 40 MG PO TBEC: 40.0000 mg | DELAYED_RELEASE_TABLET | Freq: Every day | ORAL | 6 refills | Status: DC

## 2021-08-03 MED ORDER — TRAMADOL HCL 50 MG PO TABS: 50.0000 mg | ORAL_TABLET | ORAL | 0 refills | Status: DC | PRN

## 2021-08-03 NOTE — Patient Instructions (Addendum)
If you are age 50 or younger, your body mass index should be between 19-25. Your Body mass index is 39.64 kg/m?Marland Kitchen If this is out of the aformentioned range listed, please consider follow up with your Primary Care Provider.  ?________________________________________________________ ? ?The [facility] providers would like to encourage you to use [facility] to communicate with providers for non-urgent requests or questions.  Due to long hold times on the telephone, sending your provider a message by [facility] may be a faster and more efficient way to get a response.  Please allow 48 business hours for a response.  Please remember that this is for non-urgent requests.  ?_______________________________________________________ ? ? You have been scheduled for an endoscopy. Please follow written instructions given to you at your visit today. ?If you use inhalers (even only as needed), please bring them with you on the day of your procedure. ? ?STOP Famotidine  ? ?START Pantoprazole 40 mg 1 tablet before breakfast ? ?Try to decrease your Meloxicam to once daily. ? ?START Tramadol 50 mg 1 tablet every 4 hours as needed for severe pain. ? ?Follow up pending the results of your Endoscopy. ? ?Thank you for entrusting me with your care and choosing [facility]. ? ?Nicoletta Ba, PA-C ?

## 2021-08-03 NOTE — Progress Notes (Signed)
Subjective:    Patient ID: Andre Decker, male    DOB: July 12, 1970, 51 y.o.   MRN: [id number]  HPI Andre Decker is a 51 year old African-American male, known to Dr. Hilarie Fredrickson, who comes in today for follow-up after recent ER visit on 07/30/2021 with acute abdominal pain. Patient has history of several episodes of very similar recurring abdominal pain which he says is happening less frequently now than it used to but may occur every 4 to 5 months..  He had a severe episode on 07/30/2021 that he says started about 3:30 PM, became progressively worse and eventually went to the emergency room at about 7 PM.  He describes pain which is constant and intense across the entire upper abdomen without radiation.  He typically gets nauseated but does not have any vomiting, no diarrhea, no diaphoresis, no fever.  He required a couple of rounds of pain medication in the emergency room and eventually symptoms settle down and he was sent home about 5 AM.  He had taken to [place] prior to making decision to go to the emergency room. He underwent evaluation with labs including CBC, lipase and c-Met all of which were normal. CT of the abdomen pelvis was done with contrast that showed an ill-defined 3 x 4 cm hypodense lesion in the right lobe of the liver with peripheral enhancement slightly larger than previously documented hemangioma noted on MRI in 2017.  Pancreas was normal, they mention possible bladder wall thickening versus under distention and otherwise negative. Patient has not had any recurrent episodes since.  He says he had a similar but less severe episode in November and the one prior to that may have been 6 months previous.  He is not aware of any triggers for these episodes.  He says if its not horrible he will usually take Levsin, lay down and curl in a fetal position and pain will eventually abate.  He has not had a clear etiology given for these episodes.  His chart mentions biliary dyskinesia but I do not  think this has been proven, he does have history of pancreas disease him, is status post cholecystectomy and appendectomy and has history of IBS. Patient last had EGD in 2014 for dyspepsia, this was negative exam, biopsies from the duodenum showed some chronic peptic duodenitis, no H. pylori and gastric biopsies were negative. Last colonoscopy March 2021 with diverticulosis, no polyps and was scheduled for 5-year interval follow-up. Patient also has history of Andre Decker and had been followed at [facility].  He had at 1 point been enrolled in a study and had undergone a liver biopsy as part of that in 2021. He was last seen there in January 2023.  FibroScan most recently showed a stiffness of 6.4K PA, and CAP score 220 DB/M indicating no significant hepatic steatosis and no advanced fibrosis or cirrhosis.  Their recommendation was that he did not need to follow-up at [facility] but to continue to monitor LFTs every 6 to 12 months and platelets and to periodically calculate fib 4  Reviewing patient's meds he has been on meloxicam long-term 15 mg 3 times daily which she takes for arthritis.  He was given a prescription for Pepcid by the orthopedist 40 mg daily.  Review of Systems.Pertinent positive and negative review of systems were noted in the above HPI section.  All other review of systems was otherwise negative.   Outpatient Encounter Medications as of 08/03/2021  Medication Sig   amLODipine (NORVASC) 5 MG tablet Take 5  mg by mouth daily.   AZOPT 1 % ophthalmic suspension Place 1 drop into both eyes 2 (two) times daily.   bimatoprost (LUMIGAN) 0.01 % SOLN Place 1 drop into both eyes at bedtime.    brimonidine-timolol (COMBIGAN) 0.2-0.5 % ophthalmic solution Place 1 drop into both eyes 2 (two) times daily.    Cholecalciferol 25 MCG (1000 UT) capsule Take by mouth.   Fluocinolone Acetonide Scalp 0.01 % OIL Apply 1 application topically 2 (two) times a week.   gabapentin (NEURONTIN) 300 MG capsule  TAKE 1 CAPSULE BY MOUTH THREE TIMES A DAY   Hyoscyamine Sulfate SL 0.125 MG SUBL hyoscyamine 0.125 mg sublingual tablet   ketoconazole (NIZORAL) 2 % cream Apply 1 application topically daily.   ketoconazole (NIZORAL) 2 % shampoo Apply 1 application topically 2 (two) times a week.   losartan-hydrochlorothiazide (HYZAAR) 50-12.5 MG tablet losartan 50 mg-hydrochlorothiazide 12.5 mg tablet   meloxicam (MOBIC) 15 MG tablet Take 15 mg by mouth 3 (three) times daily.   Multiple Vitamin (MULTI-VITAMIN) tablet Take by mouth.   pantoprazole (PROTONIX) 40 MG tablet Take 1 tablet (40 mg total) by mouth daily before breakfast.   RHOPRESSA 0.02 % SOLN Place 1 drop into both eyes daily.   tacrolimus (PROTOPIC) 0.1 % ointment Apply 1 application topically 2 (two) times daily.   tadalafil (CIALIS) 5 MG tablet 1 tablet as needed   traMADol (ULTRAM) 50 MG tablet Take 1 tablet (50 mg total) by mouth every 4 (four) hours as needed.   [DISCONTINUED] famotidine (PEPCID) 40 MG tablet Take 40 mg by mouth daily.   [DISCONTINUED] desonide (DESOWEN) 0.05 % cream Apply 1 application topically daily as needed (rash).   [DISCONTINUED] dicyclomine (BENTYL) 20 MG tablet Take 1 tablet (20 mg total) by mouth 4 (four) times daily -  before meals and at bedtime for 10 days.   [DISCONTINUED] methylPREDNISolone (MEDROL DOSEPAK) 4 MG TBPK tablet Take by mouth.   [DISCONTINUED] nitroGLYCERIN (NITRODUR - DOSED IN MG/24 HR) 0.2 mg/hr patch nitroglycerin 0.2 mg/hr transdermal 24 hour patch  APPLY 1 PATCH EVERY DAY BY TRANSDERMAL ROUTE FOR 30 DAYS.   [DISCONTINUED] Nutritional Supplements (MALE SUPPORT) CAPS See admin instructions.   [DISCONTINUED] ondansetron (ZOFRAN ODT) 4 MG disintegrating tablet Take 1 tablet (4 mg total) by mouth every 8 (eight) hours as needed for nausea or vomiting.   [DISCONTINUED] oxyCODONE (OXY IR/ROXICODONE) 5 MG immediate release tablet oxycodone 5 mg tablet  TAKE 1 TABLET BY MOUTH EVERY 4 HOURS AS NEEDED FOR 5  DAYS   [DISCONTINUED] predniSONE (STERAPRED UNI-PAK 21 TAB) 10 MG (21) TBPK tablet See admin instructions.   No facility-administered encounter medications on file as of 08/03/2021.   Allergies  Allergen Reactions   Blood-Group Specific Substance     Pt is one of Jehovah's Witnesses   Lisinopril-Hydrochlorothiazide Cough   Patient Active Problem List   Diagnosis Date Noted   Allergic rhinitis 11/09/2020   Biliary dyskinesia 11/09/2020   Erectile dysfunction 11/09/2020   Obstructive sleep apnea syndrome 11/09/2020   Prediabetes 11/09/2020   Pure hypercholesterolemia 11/09/2020   Sciatica 11/09/2020   Pain of left heel 11/03/2019   Non-insertional Achilles tendinopathy 10/15/2019   Fatty liver 06/25/2018   H/O vitamin D deficiency 06/25/2018   Impaired glucose metabolism 06/25/2018   S/P cholecystectomy 03/12/2017   Vagal bradycardia 11/73/5670   Biliary colic 14/03/3012   Bradycardia 09/26/2016   No advance directive on file 01/03/2016   Refusal of blood transfusions as patient is Jehovah's Witness 01/03/2016  Abdominal pain 12/19/2015   Abnormal LFTs (liver function tests) 12/19/2015   Pancreas divisum 12/19/2015   Paresthesia of left foot 04/20/2015   IRRITABLE BOWEL SYNDROME 05/23/2010   OBESITY, UNSPECIFIED 03/15/2009   HEMANGIOMA, HEPATIC 09/06/2007   HYPERTENSION 09/06/2007   Social History   Socioeconomic History   Marital status: Married    Spouse name: Rip Harbour   Number of children: 1   Years of education: 12   Highest education level: Not on file  Occupational History   Occupation: TRANSPORTATION    Employer: [employer]  Tobacco Use   Smoking status: Never   Smokeless tobacco: Never  Vaping Use   Vaping Use: Never used  Substance and Sexual Activity   Alcohol use: No    Alcohol/week: 0.0 standard drinks   Drug use: No   Sexual activity: Yes    Partners: Female  Other Topics Concern   Not on file  Social History Narrative   Lives with wife,  Rip Harbour   Patient does not drink caffeine.   Patient is left handed.    Social Determinants of Health   Financial Resource Strain: Not on file  Food Insecurity: Not on file  Transportation Needs: Not on file  Physical Activity: Not on file  Stress: Not on file  Social Connections: Not on file  Intimate Partner Violence: Not on file    Mr. Brashier's family history includes Arthritis/Rheumatoid in his maternal grandmother and paternal grandmother; Cancer in his maternal grandmother; Migraines in his mother; Prostate cancer in his maternal grandfather and maternal uncle; Stroke in his paternal grandfather.      Objective:    Vitals:   08/03/21 1334  BP: (!) 144/90  Pulse: 68    Physical Exam Well-developed well-nourished older African-American male in no acute distress.  Height, Weight 284, BMI 39.6  HEENT; nontraumatic normocephalic, EOMI, PE R LA, sclera anicteric. Oropharynx; not examined today Neck; supple, no JVD Cardiovascular; regular rate and rhythm with S1-S2, no murmur rub or gallop Pulmonary; Clear bilaterally Abdomen; soft, obese, minimal tenderness in the epigastrium and hypogastrium, nondistended, no palpable mass or hepatosplenomegaly, bowel sounds are active Rectal; not done today Skin; benign exam, no jaundice rash or appreciable lesions Extremities; no clubbing cyanosis or edema skin warm and dry Neuro/Psych; alert and oriented x4, grossly nonfocal mood and affect appropriate        Assessment & Plan:   #79 51 year old African-American male with intermittent episodes of upper/mid abdominal pain that radiates across the abdomen, at times intense, associated with nausea, no vomiting.  Patient had a prolonged intense episode on 07/30/2021 which took him to the emergency room.  He has not had recurrence since and previous episode had occurred in November which was not as bad.  Labs and imaging were reassuring, no acute findings on CT scan, he does have a  previously documented hepatic hemangioma right lobe 3 x 4 cm.  Etiology for this abdominal pain is not clear, seems intense for what we typically see with IBS, question consider biliary dyskinesia, consider gastropathy/peptic ulcer disease with regular use of higher dose prescription NSAID  Patient is status postcholecystectomy  #2 status post appendectomy #3 history of pancreas divisum #4 diverticulosis #5.  Family history of colon cancer in 2 second-degree relatives-indicated for follow-up colonoscopy March 2026 #6.  Obesity-BMI 39 #7Sleep apnea no CPAP use #8Hypertension #9 NASH -recent elastography done at [facility] showed no significant hepatic steatosis, no advanced fibrosis or cirrhosis and recommendation was for continued every 6 to  86-monthLFTs and platelets and periodic fib 4 score  Plan; patient will be scheduled for EGD with Dr. PHilarie Fredrickson  Procedure was discussed in detail with the patient including indications risk and benefits and he is agreeable to proceed. Advised to try to wean himself off of meloxicam or use as little as possible Start Protonix 40 mg p.o. every morning AC breakfast Refill Levsin for as needed use Stop famotidine/Pepcid once he starts Protonix.   Elizebath Wever S Adhrit Krenz PA-C 08/03/2021   Cc: WJonathon Jordan MD

## 2021-08-07 NOTE — Progress Notes (Signed)
Addendum: Reviewed and agree with assessment and management plan. Jailee Jaquez M, MD  

## 2021-08-09 DIAGNOSIS — R1013 Epigastric pain: Secondary | ICD-10-CM | POA: Diagnosis not present

## 2021-08-09 DIAGNOSIS — K760 Fatty (change of) liver, not elsewhere classified: Secondary | ICD-10-CM | POA: Diagnosis not present

## 2021-08-09 DIAGNOSIS — R1031 Right lower quadrant pain: Secondary | ICD-10-CM | POA: Diagnosis not present

## 2021-08-16 ENCOUNTER — Ambulatory Visit (AMBULATORY_SURGERY_CENTER): Payer: 59 | Admitting: Internal Medicine

## 2021-08-16 ENCOUNTER — Encounter: Payer: Self-pay | Admitting: Internal Medicine

## 2021-08-16 ENCOUNTER — Other Ambulatory Visit: Payer: Self-pay

## 2021-08-16 ENCOUNTER — Other Ambulatory Visit: Payer: Self-pay | Admitting: Internal Medicine

## 2021-08-16 VITALS — BP 144/90 | HR 55 | Temp 96.6°F | Resp 16 | Ht 71.0 in | Wt 284.0 lb

## 2021-08-16 DIAGNOSIS — K229 Disease of esophagus, unspecified: Secondary | ICD-10-CM

## 2021-08-16 DIAGNOSIS — K219 Gastro-esophageal reflux disease without esophagitis: Secondary | ICD-10-CM | POA: Diagnosis not present

## 2021-08-16 DIAGNOSIS — K2950 Unspecified chronic gastritis without bleeding: Secondary | ICD-10-CM

## 2021-08-16 DIAGNOSIS — R1013 Epigastric pain: Secondary | ICD-10-CM

## 2021-08-16 MED ORDER — SODIUM CHLORIDE 0.9 % IV SOLN: 500.0000 mL | INTRAVENOUS | Status: DC

## 2021-08-16 NOTE — Progress Notes (Signed)
See office note dated 08/09/2021 for details ?He remains appropriate for [facility] EGD today to evaluate upper abdominal pain ? ? ?

## 2021-08-16 NOTE — Op Note (Signed)
[facility] ?Patient Name: Andre Decker ?Procedure Date: 08/16/2021 3:04 PM ?MRN: [id number] ?Endoscopist: Jerene Bears , MD ?Age: 51 ?Referring MD:  ?Date of Birth: May 10, 1971 ?Gender: Male ?Account #: [id number] ?Procedure:                Upper GI endoscopy ?Indications:              Epigastric abdominal pain (intermittent and  ?                          intense, also an issue years ago and evaluated at  ?                          [facility] in 2017 by EUS which was unremarkable with  ?                          exception of divisum pancreatitic anatomy; prior to  ?                          this EUS his LFTs did bump with pain attacks) ?Medicines:                Monitored Anesthesia Care ?Procedure:                Pre-Anesthesia Assessment: ?                          - Prior to the procedure, a History and Physical  ?                          was performed, and patient medications and  ?                          allergies were reviewed. The patient's tolerance of  ?                          previous anesthesia was also reviewed. The risks  ?                          and benefits of the procedure and the sedation  ?                          options and risks were discussed with the patient.  ?                          All questions were answered, and informed consent  ?                          was obtained. Prior Anticoagulants: The patient has  ?                          taken no previous anticoagulant or antiplatelet  ?                          agents. ASA Grade Assessment: III - A patient with  ?  severe systemic disease. After reviewing the risks  ?                          and benefits, the patient was deemed in  ?                          satisfactory condition to undergo the procedure. ?                          After obtaining informed consent, the endoscope was  ?                          passed under direct vision. Throughout the  ?                          procedure, the  patient's blood pressure, pulse, and  ?                          oxygen saturations were monitored continuously. The  ?                          GIF [id number] #[id number] was introduced through the  ?                          mouth, and advanced to the third part of duodenum.  ?                          The upper GI endoscopy was accomplished without  ?                          difficulty. The patient tolerated the procedure  ?                          well. ?Scope In: ?Scope Out: ?Findings:                 Islands of salmon-colored mucosa were present at 41  ?                          cm. No other visible abnormalities were present.  ?                          Biopsies were taken with a cold forceps for  ?                          histology. Z-line is regular at 43 cm. ?                          The exam of the esophagus was otherwise normal. ?                          The entire examined stomach was normal. ?                          The examined duodenum  was normal. ?Complications:            No immediate complications. ?Estimated Blood Loss:     Estimated blood loss was minimal. ?Impression:               - Salmon-colored mucosal island in the distal  ?                          esophagus. Biopsied to exclude Barrett's. ?                          - Normal stomach. ?                          - Normal examined duodenum. ?Recommendation:           - Patient has a contact number available for  ?                          emergencies. The signs and symptoms of potential  ?                          delayed complications were discussed with the  ?                          patient. Return to normal activities tomorrow.  ?                          Written discharge instructions were provided to the  ?                          patient. ?                          - Resume previous diet. ?                          - Continue present medications. ?                          - Await pathology results. ?                          - If  recurrent attacks of upper abdominal pain then  ?                          consideration of ERCP with manometry to evaluate  ?                          for sphincter of Oddi dysfunction. This could be  ?                          considered at [facility] where he is already seen in [facility]  ?                          clinic. ?Jerene Bears, MD ?08/16/2021 3:28:40 PM ?This report has been signed electronically. ?

## 2021-08-16 NOTE — Progress Notes (Signed)
Report to PACU, RN, vss, BBS= Clear.  

## 2021-08-16 NOTE — Progress Notes (Signed)
Called to room to assist during endoscopic procedure.  Patient ID and intended procedure confirmed with present staff. Received instructions for my participation in the procedure from the performing physician.  

## 2021-08-16 NOTE — Patient Instructions (Signed)
Please read handouts provided. Continue present medications. Await pathology results.    YOU HAD AN ENDOSCOPIC PROCEDURE TODAY AT THE [facility]:   Refer to the procedure report that was given to you for any specific questions about what was found during the examination.  If the procedure report does not answer your questions, please call your gastroenterologist to clarify.  If you requested that your care partner not be given the details of your procedure findings, then the procedure report has been included in a sealed envelope for you to review at your convenience later.  YOU SHOULD EXPECT: Some feelings of bloating in the abdomen. Passage of more gas than usual.  Walking can help get rid of the air that was put into your GI tract during the procedure and reduce the bloating. If you had a lower endoscopy (such as a colonoscopy or flexible sigmoidoscopy) you may notice spotting of blood in your stool or on the toilet paper. If you underwent a bowel prep for your procedure, you may not have a normal bowel movement for a few days.  Please Note:  You might notice some irritation and congestion in your nose or some drainage.  This is from the oxygen used during your procedure.  There is no need for concern and it should clear up in a day or so.  SYMPTOMS TO REPORT IMMEDIATELY:    Following upper endoscopy (EGD)  Vomiting of blood or coffee ground material  New chest pain or pain under the shoulder blades  Painful or persistently difficult swallowing  New shortness of breath  Fever of 100F or higher  Black, tarry-looking stools  For urgent or emergent issues, a gastroenterologist can be reached at any hour by calling [phone]. Do not use MyChart messaging for urgent concerns.    DIET:  We do recommend a small meal at first, but then you may proceed to your regular diet.  Drink plenty of fluids but you should avoid alcoholic beverages for 24 hours.  ACTIVITY:  You  should plan to take it easy for the rest of today and you should NOT DRIVE or use heavy machinery until tomorrow (because of the sedation medicines used during the test).    FOLLOW UP: Our staff will call the number listed on your records 48-72 hours following your procedure to check on you and address any questions or concerns that you may have regarding the information given to you following your procedure. If we do not reach you, we will leave a message.  We will attempt to reach you two times.  During this call, we will ask if you have developed any symptoms of COVID 19. If you develop any symptoms (ie: fever, flu-like symptoms, shortness of breath, cough etc.) before then, please call [phone].  If you test positive for Covid 19 in the 2 weeks post procedure, please call and report this information to us.    If any biopsies were taken you will be contacted by phone or by letter within the next 1-3 weeks.  Please call us at [phone] if you have not heard about the biopsies in 3 weeks.    SIGNATURES/CONFIDENTIALITY: You and/or your care partner have signed paperwork which will be entered into your electronic medical record.  These signatures attest to the fact that that the information above on your After Visit Summary has been reviewed and is understood.  Full responsibility of the confidentiality of this discharge information lies with you and/or your care-partner. 

## 2021-08-17 ENCOUNTER — Telehealth: Payer: Self-pay

## 2021-08-17 NOTE — Telephone Encounter (Signed)
Procedure report from 08/16/21 faxed to [facility] [place] at [phone]. ?

## 2021-08-17 NOTE — Telephone Encounter (Signed)
-----   Message from Venda Rodes, RN sent at 08/16/2021  4:41 PM EDT ----- ?Regarding: Report to [facility] ?Patient would like report from today to also be sent to " Butch Penny " at the [facility]. ? ?  Thank you, B.Schwartz RN. ? ?

## 2021-08-18 ENCOUNTER — Telehealth: Payer: Self-pay

## 2021-08-18 NOTE — Telephone Encounter (Signed)
?  Follow up Call- ? ?Call back number 08/16/2021 08/03/2019  ?Post procedure Call Back phone  # [phone]  ?Permission to leave phone message Yes Yes  ?Some recent data might be hidden  ?  ? ?Patient questions: ? ?Do you have a fever, pain , or abdominal swelling? No. ?Pain Score  0 * ? ?Have you tolerated food without any problems? Yes.   ? ?Have you been able to return to your normal activities? Yes.   ? ?Do you have any questions about your discharge instructions: ?Diet   No. ?Medications  No. ?Follow up visit  No. ? ?Do you have questions or concerns about your Care? No. ? ?Actions: ?* If pain score is 4 or above: ?No action needed, pain <4. ? ? ?

## 2021-08-29 DIAGNOSIS — J309 Allergic rhinitis, unspecified: Secondary | ICD-10-CM | POA: Diagnosis not present

## 2021-08-29 DIAGNOSIS — I10 Essential (primary) hypertension: Secondary | ICD-10-CM | POA: Diagnosis not present

## 2021-08-29 DIAGNOSIS — R109 Unspecified abdominal pain: Secondary | ICD-10-CM | POA: Diagnosis not present

## 2021-08-29 DIAGNOSIS — R051 Acute cough: Secondary | ICD-10-CM | POA: Diagnosis not present

## 2021-08-29 DIAGNOSIS — R1010 Upper abdominal pain, unspecified: Secondary | ICD-10-CM | POA: Diagnosis not present

## 2021-10-16 DIAGNOSIS — R1011 Right upper quadrant pain: Secondary | ICD-10-CM | POA: Diagnosis not present

## 2022-04-30 ENCOUNTER — Encounter: Payer: Self-pay | Admitting: Podiatry

## 2022-04-30 ENCOUNTER — Ambulatory Visit (INDEPENDENT_AMBULATORY_CARE_PROVIDER_SITE_OTHER): Payer: 59 | Admitting: Podiatry

## 2022-04-30 DIAGNOSIS — G5752 Tarsal tunnel syndrome, left lower limb: Secondary | ICD-10-CM

## 2022-04-30 DIAGNOSIS — G629 Polyneuropathy, unspecified: Secondary | ICD-10-CM

## 2022-04-30 MED ORDER — TRIAMCINOLONE ACETONIDE 10 MG/ML IJ SUSP
10.0000 mg | Freq: Once | INTRAMUSCULAR | Status: AC
  Administered 2022-04-30: 10 mg

## 2022-04-30 NOTE — Progress Notes (Signed)
Subjective:   Patient ID: Andre Decker, male   DOB: 51 y.o.   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  Encouraged him to call questions concerns prior

## 2022-05-10 ENCOUNTER — Other Ambulatory Visit: Payer: Self-pay | Admitting: Podiatry

## 2022-05-21 ENCOUNTER — Ambulatory Visit: Payer: 59 | Admitting: Podiatry

## 2022-05-30 ENCOUNTER — Encounter: Payer: Self-pay | Admitting: Podiatry

## 2022-05-30 ENCOUNTER — Ambulatory Visit (INDEPENDENT_AMBULATORY_CARE_PROVIDER_SITE_OTHER): Payer: 59 | Admitting: Podiatry

## 2022-05-30 DIAGNOSIS — G629 Polyneuropathy, unspecified: Secondary | ICD-10-CM

## 2022-05-30 NOTE — Progress Notes (Signed)
Subjective:   Patient ID: Andre Decker, male   DOB: 51 y.o.   MRN: [id number]   HPI Patient presents stating I am still getting a lot of tingling and burning in the bottom of my left foot and again we have tried medication in the past and the injection helped temporarily but then the pain reoccurred   ROS      Objective:  Physical Exam  No change in neurovascular with patient showing multiple signs of neuropathy of the left foot chronic in nature and bothersome for sleep     Assessment:  Cannot rule out tarsal tunnel but neuropathy continues to be the overriding issue     Plan:  H&P discussed tarsal tunnel and we could consider surgery but he does not want this if we can avoid it and I have recommended Qutenza treatment to try to reduce the symptoms present.  That was applied today 30 minutes and patient tolerated it well will be seen back in 3 months for repeat and hopefully we can get his symptoms under control

## 2022-06-20 ENCOUNTER — Encounter: Payer: Self-pay | Admitting: Internal Medicine

## 2022-06-20 ENCOUNTER — Ambulatory Visit: Payer: 59 | Attending: Internal Medicine | Admitting: Internal Medicine

## 2022-06-20 VITALS — BP 138/90 | HR 72 | Ht 73.0 in | Wt 286.4 lb

## 2022-06-20 DIAGNOSIS — R42 Dizziness and giddiness: Secondary | ICD-10-CM

## 2022-06-20 DIAGNOSIS — I10 Essential (primary) hypertension: Secondary | ICD-10-CM | POA: Diagnosis not present

## 2022-06-20 DIAGNOSIS — R001 Bradycardia, unspecified: Secondary | ICD-10-CM | POA: Diagnosis not present

## 2022-06-20 NOTE — Progress Notes (Signed)
OFFICE FOLLOW-UP NOTE  Chief Complaint:  Follow-up studies  Primary Care Physician: Jonathon Jordan, MD  HPI:  Andre Decker is a 52 y.o. male recently seen by myself for bradycardia at the request of Dr. Kieth Brightly. Andre Decker was at the outpatient surgery center at [facility], about to undergo cholecystectomy. He was induced with by anesthesia and noted to become bradycardic with heart rate in the 40s and apparently ultimately dropped into the 20s at which time he was given Robinul which quickly reversed his bradycardia. The case was then canceled. I was consult said for management recommendations. There was no evidence of any chest pain or associated cardiac symptoms. He was completely asymptomatic after anesthesia wore off and wanted to go home. As part of a comprehensive workup I recommended an echocardiogram and exercise treadmill stress test. There is report performed in our office and I personally reviewed those studies. The stress test was negative for skin and his echocardiogram demonstrated an EF of 65-70% with normal wall motion and normal diastolic function.  06/23/2018  Andre Decker is seen today in follow-up.  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  He continues to have bradycardia with heart rate in the 40s today.  He seems asymptomatic with it.  He thought he needed to follow-up for routine monitoring.  He denies any new chest pain or worsening shortness of breath.  He did have a exercise stress testing which showed normal augmentation of heart rate.  His EKG today shows a sinus bradycardia with nonspecific T wave changes at 48.  Lab work from March 2019 showed total cholesterol 174, HDL 43, LDL 117 triglycerides 75.  Hemoglobin A1c of 6.1 creatinine 1.09.  06/20/2022  Andre Decker is seen today and considered a new patient since I last saw him in 2020.  At that time he had had bradycardia but it was considered to be likely  related to a vagal mediated response.  He was noted also to have a first-degree AV block.  He underwent exercise stress testing which showed augmentation of his heart rate and had an echo which was unremarkable.  Follow-up was to be as needed.  Subsequently was seen at [facility] and was seen by cardiology there who had no further recommendations regarding his first-degree block or bradycardia for which she was felt to be asymptomatic.  Recently he has had some issues with dizziness.  His blood pressure had been quite high in fact I reviewed home blood pressure readings in December which showed his blood pressure to be 1 65-5 70 systolic over 374 diastolic.  His initial blood pressure today was 150/100 however recheck came down to 138/90.  He tells me recently his PCP had doubled his amlodipine.  This might be responsible for his dizziness.  Heart rate today is 72 showing normal sinus rhythm with a normal PR interval and voltage criteria for LVH.  There are some inferior T wave inversions of unknown significance.  He has denied any chest pain.  PMHx:  Past Medical History:  Diagnosis Date   1st degree AV block    Biliary colic    Cataract    Glaucoma    Glaucoma of both eyes    Hypertension    IBS (irritable bowel syndrome)    chronic   IT band syndrome    Liver hemangioma    dx 2002   OSA on CPAP    moderate osa per  study 05-21-2004   Osteoarthritis    Sleep apnea    Wears contact lenses     Past Surgical History:  Procedure Laterality Date   ACHILLES TENDON REPAIR Left 02/2020   CATARACT EXTRACTION W/ INTRAOCULAR LENS IMPLANT Left 01/18/2012   CHOLECYSTECTOMY N/A 11/01/2016   Procedure: LAPAROSCOPIC CHOLECYSTECTOMY;  Surgeon: Mickeal Skinner, MD;  Location: [facility];  Service: General;  Laterality: N/A;  WILL NEED ANESTHESIA CONSULT PRIOR TO SURGERY - SEE NOTE IN SPECIAL NEEDS SECTION   COLONOSCOPY  2014   2nd colonoscopy 08/2019   COLONOSCOPY WITH ESOPHAGOGASTRODUODENOSCOPY (EGD)  last  one 11-24-2012   RETINAL DETACHMENT REPAIR W/ SCLERAL BUCKLE LE Bilateral 09/23/2009   Left eye and Laser for Retinal breaks Right eye   UPPER ESOPHAGEAL ENDOSCOPIC ULTRASOUND (EUS)  2017    [facility]   WRIST FRACTURE SURGERY Right     FAMHx:  Family History  Problem Relation Age of Onset   Migraines Mother    Prostate cancer Maternal Grandfather    Prostate cancer Maternal Uncle    Arthritis/Rheumatoid Maternal Grandmother    Cancer Maternal Grandmother        unknown type   Arthritis/Rheumatoid Paternal Grandmother    Stroke Paternal Grandfather    Stomach cancer Neg Hx    Colon cancer Neg Hx    Colon polyps Neg Hx    Rectal cancer Neg Hx    Esophageal cancer Neg Hx     SOCHx:   reports that he has never smoked. He has never used smokeless tobacco. He reports that he does not drink alcohol and does not use drugs.  ALLERGIES:  Allergies  Allergen Reactions   Blood-Group Specific Substance     Pt is one of Jehovah's Witnesses    ROS: Pertinent items noted in HPI and remainder of comprehensive ROS otherwise negative.  HOME MEDS: Current Outpatient Medications on File Prior to Visit  Medication Sig Dispense Refill   amLODipine (NORVASC) 5 MG tablet Take 5 mg by mouth daily.     AZOPT 1 % ophthalmic suspension Place 1 drop into both eyes 2 (two) times daily.     bimatoprost (LUMIGAN) 0.01 % SOLN Place 1 drop into both eyes at bedtime.      brimonidine-timolol (COMBIGAN) 0.2-0.5 % ophthalmic solution Place 1 drop into both eyes 2 (two) times daily.      Cholecalciferol 25 MCG (1000 UT) capsule Take by mouth.     Fluocinolone Acetonide Scalp 0.01 % OIL Apply 1 application topically 2 (two) times a week.     gabapentin (NEURONTIN) 300 MG capsule TAKE 1 CAPSULE BY MOUTH THREE TIMES A DAY 90 capsule 3   Hyoscyamine Sulfate SL 0.125 MG SUBL hyoscyamine 0.125 mg sublingual tablet     losartan-hydrochlorothiazide (HYZAAR) 50-12.5 MG tablet losartan 50 mg-hydrochlorothiazide 12.5 mg  tablet     meloxicam (MOBIC) 15 MG tablet Take 15 mg by mouth 3 (three) times daily.     Multiple Vitamin (MULTI-VITAMIN) tablet Take by mouth.     pantoprazole (PROTONIX) 40 MG tablet Take 1 tablet (40 mg total) by mouth daily before breakfast. 30 tablet 6   RHOPRESSA 0.02 % SOLN Place 1 drop into both eyes daily.     tacrolimus (PROTOPIC) 0.1 % ointment Apply 1 application topically 2 (two) times daily.     tadalafil (CIALIS) 5 MG tablet 1 tablet as needed     traMADol (ULTRAM) 50 MG tablet Take 1 tablet (50 mg total) by mouth every 4 (four)  hours as needed. 10 tablet 0   No current facility-administered medications on file prior to visit.    LABS/IMAGING: No results found for this or any previous visit (from the past 48 hour(s)). No results found.  LIPID PANEL: No results found for: "CHOL", "TRIG", "HDL", "CHOLHDL", "VLDL", "LDLCALC", "LDLDIRECT"   WEIGHTS: Wt Readings from Last 3 Encounters:  06/20/22 286 lb 6.4 oz (129.9 kg)  08/16/21 284 lb (128.8 kg)  08/03/21 284 lb 4 oz (128.9 kg)    VITALS: BP (!) 138/90   Pulse 72   Ht '6\' 1"'$  (1.854 m)   Wt 286 lb 6.4 oz (129.9 kg)   SpO2 96%   BMI 37.79 kg/m   EXAM: General appearance: alert and no distress Neck: no carotid bruit, no JVD and thyroid not enlarged, symmetric, no tenderness/mass/nodules Lungs: clear to auscultation bilaterally Heart: regular bradycrardia Abdomen: soft, non-tender; bowel sounds normal; no masses,  no organomegaly Extremities: extremities normal, atraumatic, no cyanosis or edema Pulses: 2+ and symmetric Skin: Skin color, texture, turgor normal. No rashes or lesions Neurologic: Grossly normal Psych: Pleasant  EKG: Normal sinus rhythm with sinus arrhythmia at 72, voltage criteria for LVH and inferior T wave changes (not new compared to 2020)-personally reviewed  ASSESSMENT: Vagally-mediated bradycardia Dizziness, possibly due to uncontrolled hypertension Hypertension  PLAN: 1.   Mr. Kropf  has had some issues with dizziness which may be due to uncontrolled hypertension.  His blood pressure appears to have improved somewhat with doubling his amlodipine.  He may need additional therapy.  1 option might be hydralazine which would help with the blood pressure and as a side effect could increase heart rate.  I would avoid AV nodal blocking agents given his history of bradycardia.  I advised him to purchase a heart rate monitor that he can wear when exerting himself or to try to correlate any symptoms of dizziness with possible significant bradycardia.  Plan follow-up with me as needed.  Pixie Casino, MD, [facility], [place] Director of the Advanced Lipid Disorders &  Cardiovascular Risk Reduction Clinic Diplomate of the American Board of Clinical Lipidology Attending Cardiologist  Direct Dial: [phone]  Fax: [phone]  Website:  [email or web address] 06/20/2022, 6:51 PM

## 2022-06-20 NOTE — Patient Instructions (Signed)
Medication Instructions:  NO CHANGES  *If you need a refill on your cardiac medications before your next appointment, please call your pharmacy*    Follow-Up: At [facility], you and your health needs are our priority.  As part of our continuing mission to provide you with exceptional heart care, we have created designated Provider Care Teams.  These Care Teams include your primary Cardiologist (physician) and Advanced Practice Providers (APPs -  Physician Assistants and Nurse Practitioners) who all work together to provide you with the care you need, when you need it.  We recommend signing up for the patient portal called "MyChart".  Sign up information is provided on this After Visit Summary.  MyChart is used to connect with patients for Virtual Visits (Telemedicine).  Patients are able to view lab/test results, encounter notes, upcoming appointments, etc.  Non-urgent messages can be sent to your provider as well.   To learn more about what you can do with MyChart, go to [email or web address].    Your next appointment:    AS NEEDED with Dr. Hilty  

## 2022-07-06 ENCOUNTER — Encounter (INDEPENDENT_AMBULATORY_CARE_PROVIDER_SITE_OTHER): Payer: 59 | Admitting: Ophthalmology

## 2022-07-06 DIAGNOSIS — H43813 Vitreous degeneration, bilateral: Secondary | ICD-10-CM | POA: Diagnosis not present

## 2022-07-06 DIAGNOSIS — H338 Other retinal detachments: Secondary | ICD-10-CM | POA: Diagnosis not present

## 2022-07-06 DIAGNOSIS — H4423 Degenerative myopia, bilateral: Secondary | ICD-10-CM

## 2022-07-06 DIAGNOSIS — H33301 Unspecified retinal break, right eye: Secondary | ICD-10-CM

## 2022-08-04 ENCOUNTER — Other Ambulatory Visit: Payer: Self-pay | Admitting: Physician Assistant

## 2022-08-30 ENCOUNTER — Other Ambulatory Visit: Payer: 59

## 2022-09-01 ENCOUNTER — Other Ambulatory Visit: Payer: Self-pay | Admitting: Physician Assistant

## 2022-09-07 ENCOUNTER — Telehealth: Payer: Self-pay | Admitting: Podiatry

## 2022-09-07 NOTE — Telephone Encounter (Signed)
Pt is calling to check on the authorization status for Qutenza treatment.  Please advise

## 2022-09-10 NOTE — Telephone Encounter (Signed)
What is his status for qutenza treatment thru insurance?

## 2022-09-14 NOTE — Telephone Encounter (Signed)
Patient called back and stated he got the Qutenza treatment the last time he  was in office and it was covered. He was calling to ask why that has changed

## 2023-07-08 ENCOUNTER — Encounter (INDEPENDENT_AMBULATORY_CARE_PROVIDER_SITE_OTHER): Payer: 59 | Admitting: Ophthalmology

## 2023-07-08 ENCOUNTER — Encounter (INDEPENDENT_AMBULATORY_CARE_PROVIDER_SITE_OTHER): Payer: Self-pay

## 2023-07-15 ENCOUNTER — Encounter (INDEPENDENT_AMBULATORY_CARE_PROVIDER_SITE_OTHER): Payer: 59 | Admitting: Ophthalmology

## 2023-07-15 DIAGNOSIS — H35033 Hypertensive retinopathy, bilateral: Secondary | ICD-10-CM | POA: Diagnosis not present

## 2023-07-15 DIAGNOSIS — I10 Essential (primary) hypertension: Secondary | ICD-10-CM | POA: Diagnosis not present

## 2023-07-15 DIAGNOSIS — H43813 Vitreous degeneration, bilateral: Secondary | ICD-10-CM | POA: Diagnosis not present

## 2023-07-15 DIAGNOSIS — H338 Other retinal detachments: Secondary | ICD-10-CM | POA: Diagnosis not present

## 2023-07-15 DIAGNOSIS — H33301 Unspecified retinal break, right eye: Secondary | ICD-10-CM

## 2023-07-15 DIAGNOSIS — H4423 Degenerative myopia, bilateral: Secondary | ICD-10-CM

## 2023-11-28 ENCOUNTER — Encounter (HOSPITAL_COMMUNITY): Payer: Self-pay | Admitting: Emergency Medicine

## 2023-11-28 ENCOUNTER — Emergency Department (HOSPITAL_COMMUNITY): Payer: Self-pay

## 2023-11-28 ENCOUNTER — Emergency Department (HOSPITAL_COMMUNITY)
Admission: EM | Admit: 2023-11-28 | Discharge: 2023-11-29 | Disposition: A | Discharge status: Home / Self Care | Payer: Self-pay | Attending: Emergency Medicine | Admitting: Emergency Medicine

## 2023-11-28 ENCOUNTER — Other Ambulatory Visit: Payer: Self-pay

## 2023-11-28 DIAGNOSIS — F419 Anxiety disorder, unspecified: Secondary | ICD-10-CM | POA: Insufficient documentation

## 2023-11-28 DIAGNOSIS — I10 Essential (primary) hypertension: Secondary | ICD-10-CM | POA: Insufficient documentation

## 2023-11-28 DIAGNOSIS — I471 Supraventricular tachycardia, unspecified: Secondary | ICD-10-CM | POA: Insufficient documentation

## 2023-11-28 DIAGNOSIS — E876 Hypokalemia: Secondary | ICD-10-CM | POA: Insufficient documentation

## 2023-11-28 LAB — TROPONIN I (HIGH SENSITIVITY)
Troponin I (High Sensitivity): 41 ng/L — ABNORMAL HIGH (ref <18)
Troponin I (High Sensitivity): 409 ng/L (ref <18)

## 2023-11-28 LAB — CBC
HCT: 37 % — ABNORMAL LOW (ref 39.0–52.0)
Hemoglobin: 12.4 g/dL — ABNORMAL LOW (ref 13.0–17.0)
MCH: 28.1 pg (ref 26.0–34.0)
MCHC: 33.5 g/dL (ref 30.0–36.0)
MCV: 83.9 fL (ref 80.0–100.0)
Platelets: 188 10*3/uL (ref 150–400)
RBC: 4.41 MIL/uL (ref 4.22–5.81)
RDW: 13 % (ref 11.5–15.5)
WBC: 5.8 10*3/uL (ref 4.0–10.5)
nRBC: 0 % (ref 0.0–0.2)

## 2023-11-28 LAB — I-STAT CHEM 8, ED
BUN: 10 mg/dL (ref 6–20)
Calcium, Ion: 1.11 mmol/L — ABNORMAL LOW (ref 1.15–1.40)
Chloride: 113 mmol/L — ABNORMAL HIGH (ref 98–111)
Creatinine, Ser: 1.1 mg/dL (ref 0.61–1.24)
Glucose, Bld: 94 mg/dL (ref 70–99)
HCT: 34 % — ABNORMAL LOW (ref 39.0–52.0)
Hemoglobin: 11.6 g/dL — ABNORMAL LOW (ref 13.0–17.0)
Potassium: 2.9 mmol/L — ABNORMAL LOW (ref 3.5–5.1)
Sodium: 148 mmol/L — ABNORMAL HIGH (ref 135–145)
TCO2: 18 mmol/L — ABNORMAL LOW (ref 22–32)

## 2023-11-28 LAB — COMPREHENSIVE METABOLIC PANEL WITH GFR
ALT: 30 U/L (ref 0–44)
AST: 38 U/L (ref 15–41)
Albumin: 3.3 g/dL — ABNORMAL LOW (ref 3.5–5.0)
Alkaline Phosphatase: 35 U/L — ABNORMAL LOW (ref 38–126)
Anion gap: 9 (ref 5–15)
BUN: 11 mg/dL (ref 6–20)
CO2: 18 mmol/L — ABNORMAL LOW (ref 22–32)
Calcium: 8.3 mg/dL — ABNORMAL LOW (ref 8.9–10.3)
Chloride: 117 mmol/L — ABNORMAL HIGH (ref 98–111)
Creatinine, Ser: 1.18 mg/dL (ref 0.61–1.24)
GFR, Estimated: >60 mL/min (ref >60)
Glucose, Bld: 94 mg/dL (ref 70–99)
Potassium: 3.1 mmol/L — ABNORMAL LOW (ref 3.5–5.1)
Sodium: 144 mmol/L (ref 135–145)
Total Bilirubin: 0.7 mg/dL (ref 0.0–1.2)
Total Protein: 5.6 g/dL — ABNORMAL LOW (ref 6.5–8.1)

## 2023-11-28 LAB — MAGNESIUM: Magnesium: 1.6 mg/dL — ABNORMAL LOW (ref 1.7–2.4)

## 2023-11-28 LAB — TSH: TSH: 3.387 u[IU]/mL (ref 0.350–4.500)

## 2023-11-28 MED ORDER — MAGNESIUM SULFATE 2 GM/50ML IV SOLN
2.0000 g | Freq: Once | INTRAVENOUS | Status: AC
  Administered 2023-11-28: 2 g via INTRAVENOUS
  Filled 2023-11-28: qty 50

## 2023-11-28 MED ORDER — POTASSIUM CHLORIDE CRYS ER 20 MEQ PO TBCR
40.0000 meq | EXTENDED_RELEASE_TABLET | Freq: Once | ORAL | Status: AC
  Administered 2023-11-28: 40 meq via ORAL
  Filled 2023-11-28: qty 2

## 2023-11-28 NOTE — ED Triage Notes (Signed)
 Pt having chest pain and sob that started at church. Pt was found to be in svt by EMS. Pt got 6mg  adenosine and converted to sinus rhythm

## 2023-11-28 NOTE — ED Provider Notes (Signed)
 [facility] Provider Note   CSN: [id number] Arrival date & time: 11/28/23  2008     History Chief Complaint  Patient presents with   Chest Pain    Andre Decker is a 53 y.o. male w/ PMHx obesity, hypertension, IBS, biliary dyskinesia, who presents to the ED for evaluation of chest pain and palpitations.  Patient states he began feeling his heart racing followed by chest pain.  EMS found patient to be in SVT.  Patient received 6 of adenosine and returned to normal sinus rhythm.  Patient continues to endorse chest pressure.  No radiation of pain.  No shortness of breath.  Patient denies any medication changes Intake or dehydration.       Physical Exam Updated Vital Signs BP (!) 146/105   Pulse 70   Temp 97.8 F (36.6 C)   Resp 13   SpO2 100%  Physical Exam Vitals and nursing note reviewed.  Constitutional:      General: He is not in acute distress.    Appearance: He is well-developed. He is obese.  HENT:     Head: Normocephalic and atraumatic.   Eyes:     Conjunctiva/sclera: Conjunctivae normal.    Cardiovascular:     Rate and Rhythm: Normal rate and regular rhythm.     Pulses:          Radial pulses are 2+ on the right side and 2+ on the left side.       Posterior tibial pulses are 2+ on the right side and 2+ on the left side.     Heart sounds: Normal heart sounds. No murmur heard. Pulmonary:     Effort: Pulmonary effort is normal. No respiratory distress.     Breath sounds: Normal breath sounds. No wheezing.  Abdominal:     Palpations: Abdomen is soft.     Tenderness: There is no abdominal tenderness.   Musculoskeletal:        General: No swelling.     Cervical back: Neck supple.   Skin:    General: Skin is warm and dry.     Capillary Refill: Capillary refill takes less than 2 seconds.   Neurological:     Mental Status: He is alert.   Psychiatric:        Mood and Affect: Mood is anxious.     ED  Results / Procedures / Treatments   Labs (all labs ordered are listed, but only abnormal results are displayed) Labs Reviewed  CBC - Abnormal; Notable for the following components:      Result Value   Hemoglobin 12.4 (*)    HCT 37.0 (*)    All other components within normal limits  COMPREHENSIVE METABOLIC PANEL WITH GFR - Abnormal; Notable for the following components:   Potassium 3.1 (*)    Chloride 117 (*)    CO2 18 (*)    Calcium 8.3 (*)    Total Protein 5.6 (*)    Albumin 3.3 (*)    Alkaline Phosphatase 35 (*)    All other components within normal limits  MAGNESIUM  - Abnormal; Notable for the following components:   Magnesium  1.6 (*)    All other components within normal limits  I-STAT CHEM 8, ED - Abnormal; Notable for the following components:   Sodium 148 (*)    Potassium 2.9 (*)    Chloride 113 (*)    Calcium, Ion 1.11 (*)    TCO2 18 (*)  Hemoglobin 11.6 (*)    HCT 34.0 (*)    All other components within normal limits  TROPONIN I (HIGH SENSITIVITY) - Abnormal; Notable for the following components:   Troponin I (High Sensitivity) 41 (*)    All other components within normal limits  TSH  TROPONIN I (HIGH SENSITIVITY)    EKG EKG Interpretation Date/Time:  Thursday November 28 2023 20:12:12 EDT Ventricular Rate:  76 PR Interval:  189 QRS Duration:  108 QT Interval:  393 QTC Calculation: 442 R Axis:   68  Text Interpretation: Sinus rhythm Confirmed by Francesca Fallow ([id number]) on 11/28/2023 9:35:57 PM  Radiology DG Chest Port 1 View Result Date: 11/28/2023 CLINICAL DATA:  Chest pain 06/17/2014 EXAM: PORTABLE CHEST 1 VIEW COMPARISON:  None Available. FINDINGS: The heart size and mediastinal contours are within normal limits. Both lungs are clear. The visualized skeletal structures are unremarkable. IMPRESSION: No active disease. Electronically Signed   By: Dorethia Molt M.D.   On: 11/28/2023 21:12    Medications Ordered in ED Medications  magnesium  sulfate IVPB  2 g 50 mL (2 g Intravenous New Bag/Given 11/28/23 2213)  potassium chloride  SA (KLOR-CON  M) CR tablet 40 mEq (40 mEq Oral Given 11/28/23 2213)    ED Course/ Medical Decision Making/ A&P  Andre Decker is a 53 y.o. male presents as detailed above  Differential ddx: Arrhythmia, electrolyte abnormalities, endocrine abnormality, ACS  On arrival, patient afebrile hemodynamically stable no hypoxia or respiratory distress.  ED Work-up: Please see details of labs and imaging listed above. Patient found to have hypomagnesemia and hypokalemia.  Both replaced in ED.  TSH within normal limits.  No significant abnormality on CBC. Initial troponin 41. Will repeat in 2 hours  2nd trop pending at time of handoff   Patient seen with supervising physician who agrees with plan.  Final Clinical Impression(s) / ED Diagnoses Final diagnoses:  None    Rx / DC Orders ED Discharge Orders     None       Waddell Seats, DO PGY-3 Emergency Medicine    Seats Waddell, DO 11/28/23 2330    Francesca Fallow CROME, MD 11/30/23 [phone]

## 2023-11-29 NOTE — ED Provider Notes (Signed)
 Assumed care at shift change.  See prior note for full H&P.  Briefly, 53 year old male presenting to the ED after episode of SVT.  Converted with 6 mg of adenosine with EMS.  Endorse some chest pressure upon arrival but seems to have resolved now.  Did have some electrolyte derangements, these were replaced here.  Plan: Delta Cam is pending.  Results for orders placed or performed during the hospital encounter of 11/28/23  CBC   Collection Time: 11/28/23  8:20 PM  Result Value Ref Range   WBC 5.8 4.0 - 10.5 K/uL   RBC 4.41 4.22 - 5.81 MIL/uL   Hemoglobin 12.4 (L) 13.0 - 17.0 g/dL   HCT 62.9 (L) 60.9 - 47.9 %   MCV 83.9 80.0 - 100.0 fL   MCH 28.1 26.0 - 34.0 pg   MCHC 33.5 30.0 - 36.0 g/dL   RDW 86.9 88.4 - 84.4 %   Platelets 188 150 - 400 K/uL   nRBC 0.0 0.0 - 0.2 %  Comprehensive metabolic panel   Collection Time: 11/28/23  8:20 PM  Result Value Ref Range   Sodium 144 135 - 145 mmol/L   Potassium 3.1 (L) 3.5 - 5.1 mmol/L   Chloride 117 (H) 98 - 111 mmol/L   CO2 18 (L) 22 - 32 mmol/L   Glucose, Bld 94 70 - 99 mg/dL   BUN 11 6 - 20 mg/dL   Creatinine, Ser 8.81 0.61 - 1.24 mg/dL   Calcium 8.3 (L) 8.9 - 10.3 mg/dL   Total Protein 5.6 (L) 6.5 - 8.1 g/dL   Albumin 3.3 (L) 3.5 - 5.0 g/dL   AST 38 15 - 41 U/L   ALT 30 0 - 44 U/L   Alkaline Phosphatase 35 (L) 38 - 126 U/L   Total Bilirubin 0.7 0.0 - 1.2 mg/dL   GFR, Estimated >39 >39 mL/min   Anion gap 9 5 - 15  Magnesium    Collection Time: 11/28/23  8:20 PM  Result Value Ref Range   Magnesium  1.6 (L) 1.7 - 2.4 mg/dL  Troponin I (High Sensitivity)   Collection Time: 11/28/23  8:20 PM  Result Value Ref Range   Troponin I (High Sensitivity) 41 (H) <18 ng/L  TSH   Collection Time: 11/28/23  8:21 PM  Result Value Ref Range   TSH 3.387 0.350 - 4.500 uIU/mL  I-Stat Chem 8, ED   Collection Time: 11/28/23  8:59 PM  Result Value Ref Range   Sodium 148 (H) 135 - 145 mmol/L   Potassium 2.9 (L) 3.5 - 5.1 mmol/L   Chloride 113 (H)  98 - 111 mmol/L   BUN 10 6 - 20 mg/dL   Creatinine, Ser 8.89 0.61 - 1.24 mg/dL   Glucose, Bld 94 70 - 99 mg/dL   Calcium, Ion 8.88 (L) 1.15 - 1.40 mmol/L   TCO2 18 (L) 22 - 32 mmol/L   Hemoglobin 11.6 (L) 13.0 - 17.0 g/dL   HCT 65.9 (L) 60.9 - 47.9 %  Troponin I (High Sensitivity)   Collection Time: 11/28/23 10:20 PM  Result Value Ref Range   Troponin I (High Sensitivity) 409 (HH) <18 ng/L   DG Chest Port 1 View Result Date: 11/28/2023 CLINICAL DATA:  Chest pain 06/17/2014 EXAM: PORTABLE CHEST 1 VIEW COMPARISON:  None Available. FINDINGS: The heart size and mediastinal contours are within normal limits. Both lungs are clear. The visualized skeletal structures are unremarkable. IMPRESSION: No active disease. Electronically Signed   By: Dorethia Kimberlee HERO.D.  On: 11/28/2023 21:12   Delta trop 409.  Suspect this is rate related.    Discussed with cardiology, Dr. Gladis-- agrees due to SVT.  Does not recommend admission/observation, feels he can follow-up OP.  Will place ambulatory referral to cardiology.  Patient reassessed and he is resting comfortably.  VS remain stable.  He is comfortable with d/c and OP follow-up.  Encouraged to follow-up with PCP in the interim.  Can return here for any new/acute changes.   Jarold Olam HERO, PA-C 11/29/23 0026    Haze Lonni PARAS, MD 11/29/23 [phone]

## 2023-11-29 NOTE — Discharge Instructions (Signed)
 I have placed a referral to cardiology clinic-- they should be contacting you with an appt. If you do not hear from them in the next few days, please follow-up with clinic about this.  I have attached their information. Return to the ED for new or worsening symptoms.

## 2023-11-29 NOTE — ED Notes (Signed)
Pt discharged. Pt given discharge papers and papers explained. Pt in NAD at this time

## 2023-12-17 ENCOUNTER — Ambulatory Visit: Payer: Self-pay | Attending: Cardiology | Admitting: Cardiology

## 2023-12-17 ENCOUNTER — Encounter: Payer: Self-pay | Admitting: Cardiology

## 2023-12-17 VITALS — BP 160/90 | HR 81 | Ht 73.0 in | Wt 283.8 lb

## 2023-12-17 DIAGNOSIS — R0609 Other forms of dyspnea: Secondary | ICD-10-CM

## 2023-12-17 DIAGNOSIS — R011 Cardiac murmur, unspecified: Secondary | ICD-10-CM

## 2023-12-17 DIAGNOSIS — G4733 Obstructive sleep apnea (adult) (pediatric): Secondary | ICD-10-CM

## 2023-12-17 DIAGNOSIS — E7800 Pure hypercholesterolemia, unspecified: Secondary | ICD-10-CM

## 2023-12-17 DIAGNOSIS — I10 Essential (primary) hypertension: Secondary | ICD-10-CM

## 2023-12-17 HISTORY — DX: Other forms of dyspnea: R06.09

## 2023-12-17 MED ORDER — METOPROLOL TARTRATE 100 MG PO TABS: 100.0000 mg | ORAL_TABLET | Freq: Once | ORAL | 0 refills | Status: DC

## 2023-12-17 NOTE — Progress Notes (Signed)
 Cardiology Office Note:    Date:  12/17/2023   ID:  Andre Decker, DOB Nov 15, 1970, MRN [id number]  PCP:  Verena Mems, MD  Cardiologist:  Jennifer JONELLE Crape, MD   Referring MD: Jarold Olam HERO, PA-C    ASSESSMENT:    1. DOE (dyspnea on exertion)   2. Murmur, cardiac   3. HYPERTENSION   4. Obstructive sleep apnea syndrome   5. Pure hypercholesterolemia    PLAN:    In order of problems listed above:  Primary prevention stressed with the patient.  Importance of compliance with diet medication stressed and patient verbalized standing. Dyspnea on exertion: Has multiple risk factors for coronary artery disease I did discuss histo of coronary disease.  I discussed CT angiography with FFR.  He is agreeable.  His troponin was elevated during the hospital stay. History of SVT: He has not had any recent events.  Will monitor this.  He may need medications if this recurs. Essential hypertension: Blood pressure is stable and diet was emphasized.  Will keep a track of his blood pressures.  Salt intake and lifestyle modification issues were discussed. Cardiac murmur: Echocardiogram will be done to assess murmur heard on auscultation. Patient will be seen in follow-up appointment in 6 months or earlier if the patient has any concerns.   Medication Adjustments/Labs and Tests Ordered: Current medicines are reviewed at length with the patient today.  Concerns regarding medicines are outlined above.  Orders Placed This Encounter  Procedures   CT CORONARY MORPH W/CTA COR W/SCORE W/CA W/CM &/OR WO/CM   ECHOCARDIOGRAM COMPLETE   Meds ordered this encounter  Medications   metoprolol  tartrate (LOPRESSOR ) 100 MG tablet    Sig: Take 1 tablet (100 mg total) by mouth once for 1 dose. Take 2 hours prior to your CT if your heart rate is greater than 55    Dispense:  1 tablet    Refill:  0     History of Present Illness:    Andre Decker is a 54 y.o. male who is being seen today for  the evaluation of supraventricular tachycardia and dyspnea on exertion at the request of Jarold Olam HERO, PA-C.  Patient is a pleasant 53 year old male.  He has past medical history of essential hypertension.  Overall he leads a sedentary lifestyle.  He mentions to me that he went to the hospital for SVT and was given adenosine and this helped him.  He denies any chest pain orthopnea or PND.  He has some dyspnea on exertion.  His palpitations lasted about 45 minutes before he went to the ER.  No syncope.  At the time of my evaluation, the patient is alert awake oriented and in no distress.  Past Medical History:  Diagnosis Date   1st degree AV block    Biliary colic    Cataract    Glaucoma    Glaucoma of both eyes    Hypertension    IBS (irritable bowel syndrome)    chronic   IT band syndrome    Liver hemangioma    dx 2002   OSA on CPAP    moderate osa per study 05-21-2004   Osteoarthritis    Sleep apnea    Wears contact lenses     Past Surgical History:  Procedure Laterality Date   ACHILLES TENDON REPAIR Left 02/2020   CATARACT EXTRACTION W/ INTRAOCULAR LENS IMPLANT Left 01/18/2012   CHOLECYSTECTOMY N/A 11/01/2016   Procedure: LAPAROSCOPIC CHOLECYSTECTOMY;  Surgeon: Stevie Herlene Righter,  MD;  Location: [facility];  Service: General;  Laterality: N/A;  WILL NEED ANESTHESIA CONSULT PRIOR TO SURGERY - SEE NOTE IN SPECIAL NEEDS SECTION   COLONOSCOPY  2014   2nd colonoscopy 08/2019   COLONOSCOPY WITH ESOPHAGOGASTRODUODENOSCOPY (EGD)  last one 11-24-2012   RETINAL DETACHMENT REPAIR W/ SCLERAL BUCKLE LE Bilateral 09/23/2009   Left eye and Laser for Retinal breaks Right eye   UPPER ESOPHAGEAL ENDOSCOPIC ULTRASOUND (EUS)  2017    [facility]   WRIST FRACTURE SURGERY Right     Current Medications: Current Meds  Medication Sig   bimatoprost (LUMIGAN) 0.01 % SOLN Place 1 drop into both eyes at bedtime.    brimonidine-timolol (COMBIGAN) 0.2-0.5 % ophthalmic solution Place 1 drop into both eyes 2  (two) times daily.    Cholecalciferol 25 MCG (1000 UT) capsule Take by mouth.   metoprolol  tartrate (LOPRESSOR ) 100 MG tablet Take 1 tablet (100 mg total) by mouth once for 1 dose. Take 2 hours prior to your CT if your heart rate is greater than 55   RHOPRESSA 0.02 % SOLN Place 1 drop into both eyes daily.   tacrolimus (PROTOPIC) 0.1 % ointment Apply 1 application topically 2 (two) times daily.   tadalafil (CIALIS) 5 MG tablet 1 tablet as needed     Allergies:   Blood-group specific substance   Social History   Socioeconomic History   Marital status: Married    Spouse name: Newell   Number of children: 1   Years of education: 12   Highest education level: Not on file  Occupational History   Occupation: TRANSPORTATION    Employer: [employer]  Tobacco Use   Smoking status: Never   Smokeless tobacco: Never  Vaping Use   Vaping status: Never Used  Substance and Sexual Activity   Alcohol use: No    Alcohol/week: 0.0 standard drinks of alcohol   Drug use: No   Sexual activity: Yes    Partners: Female  Other Topics Concern   Not on file  Social History Narrative   Lives with wife, Newell   Patient does not drink caffeine.   Patient is left handed.    Social Drivers of Corporate investment banker Strain: Not on file  Food Insecurity: Not on file  Transportation Needs: Not on file  Physical Activity: Not on file  Stress: Not on file  Social Connections: Not on file     Family History: The patient's family history includes Arthritis/Rheumatoid in his maternal grandmother and paternal grandmother; Cancer in his maternal grandmother; Migraines in his mother; Prostate cancer in his maternal grandfather and maternal uncle; Stroke in his paternal grandfather. There is no history of Stomach cancer, Colon cancer, Colon polyps, Rectal cancer, or Esophageal cancer.  ROS:   Please see the history of present illness.    All other systems reviewed and are  negative.  EKGs/Labs/Other Studies Reviewed:    The following studies were reviewed today: .SABRA          Recent Labs: 11/28/2023: ALT 30; BUN 10; Creatinine, Ser 1.10; Hemoglobin 11.6; Magnesium  1.6; Platelets 188; Potassium 2.9; Sodium 148; TSH 3.387  Recent Lipid Panel No results found for: CHOL, TRIG, HDL, CHOLHDL, VLDL, LDLCALC, LDLDIRECT  Physical Exam:    VS:  BP (!) 160/90   Pulse 81   Ht 6' 1 (1.854 m)   Wt 283 lb 12.8 oz (128.7 kg)   SpO2 97%   BMI 37.44 kg/m     Wt Readings from  Last 3 Encounters:  12/17/23 283 lb 12.8 oz (128.7 kg)  06/20/22 286 lb 6.4 oz (129.9 kg)  08/16/21 284 lb (128.8 kg)     GEN: Patient is in no acute distress HEENT: Normal NECK: No JVD; No carotid bruits LYMPHATICS: No lymphadenopathy CARDIAC: S1 S2 regular, 2/6 systolic murmur at the apex. RESPIRATORY:  Clear to auscultation without rales, wheezing or rhonchi  ABDOMEN: Soft, non-tender, non-distended MUSCULOSKELETAL:  No edema; No deformity  SKIN: Warm and dry NEUROLOGIC:  Alert and oriented x 3 PSYCHIATRIC:  Normal affect    Signed, Jennifer JONELLE Crape, MD  12/17/2023 1:57 PM    [facility]

## 2023-12-17 NOTE — Patient Instructions (Signed)
 Please keep a BP log for 2 weeks and send by MyChart or mail.                         Name and DOB__________________________ Dr. Edwyna [address] [place], [place] [address]  Blood Pressure Record Sheet To take your blood pressure, you will need a blood pressure machine. You can buy a blood pressure machine (blood pressure monitor) at your clinic, drug store, or online. When choosing one, consider: An automatic monitor that has an arm cuff. A cuff that wraps snugly around your upper arm. You should be able to fit only one finger between your arm and the cuff. A device that stores blood pressure reading results. Do not choose a monitor that measures your blood pressure from your wrist or finger. Follow your health care provider's instructions for how to take your blood pressure. To use this form: Get one reading in the morning (a.m.) 1-2 hours after you take any medicines. Get one reading in the evening (p.m.) before supper.   Blood pressure log Date: _______________________  a.m. _____________________(1st reading) HR___________            p.m. _____________________(2nd reading) HR__________  Date: _______________________  a.m. _____________________(1st reading) HR___________            p.m. _____________________(2nd reading) HR__________  Date: _______________________  a.m. _____________________(1st reading) HR___________            p.m. _____________________(2nd reading) HR__________  Date: _______________________  a.m. _____________________(1st reading) HR___________            p.m. _____________________(2nd reading) HR__________  Date: _______________________  a.m. _____________________(1st reading) HR___________            p.m. _____________________(2nd reading) HR__________  Date: _______________________  a.m. _____________________(1st reading) HR___________            p.m. _____________________(2nd reading) HR__________  Date: _______________________  a.m.  _____________________(1st reading) HR___________            p.m. _____________________(2nd reading) HR__________   This information is not intended to replace advice given to you by your health care provider. Make sure you discuss any questions you have with your health care provider. Document Revised: 09/09/2019 Document Reviewed: 09/09/2019 Elsevier Patient Education  2021 Elsevier Inc.   Medication Instructions:  Your physician recommends that you continue on your current medications as directed. Please refer to the Current Medication list given to you today.  *If you need a refill on your cardiac medications before your next appointment, please call your pharmacy*   Lab Work: None ordered If you have labs (blood work) drawn today and your tests are completely normal, you will receive your results only by: MyChart Message (if you have MyChart) OR A paper copy in the mail If you have any lab test that is abnormal or we need to change your treatment, we will call you to review the results.  Testing/Procedures: Your physician has requested that you have an echocardiogram. Echocardiography is a painless test that uses sound waves to create images of your heart. It provides your doctor with information about the size and shape of your heart and how well your heart's chambers and valves are working. This procedure takes approximately one hour. There are no restrictions for this procedure. Please do NOT wear cologne, perfume, aftershave, or lotions (deodorant is allowed). Please arrive 15 minutes prior to your appointment time.  Please note: We ask at that you not bring children with you  during ultrasound (echo/ vascular) testing. Due to room size and safety concerns, children are not allowed in the ultrasound rooms during exams. Our front office staff cannot provide observation of children in our lobby area while testing is being conducted. An adult accompanying a patient to their  appointment will only be allowed in the ultrasound room at the discretion of the ultrasound technician under special circumstances. We apologize for any inconvenience.    Your cardiac CT will be scheduled at one of the below locations:   [facility] [address]  [place], [place] [address]  If scheduled at the [facility] at [address], please enter the parking lot using the [address] entrance and use the FREE valet service at the patient drop-off area. Enter the buidling and check-in with registration on the main floor.  Please follow these instructions carefully (unless otherwise directed):  An IV will be required for this test and Nitroglycerin will be given.  Hold all erectile dysfunction medications at least 3 days (72 hrs) prior to test. (Ie viagra, cialis, sildenafil, tadalafil, etc)   On the Night Before the Test: Be sure to Drink plenty of water. Do not consume any caffeinated/decaffeinated beverages or chocolate 12 hours prior to your test. Do not take any antihistamines 12 hours prior to your test.  On the Day of the Test: Drink plenty of water until 1 hour prior to the test. Do not eat any food 1 hour prior to test. You may take your regular medications prior to the test.  Take metoprolol  (Lopressor ) 100 mg two hours prior to test. This is a one time dose.  After the Test: Drink plenty of water. After receiving IV contrast, you may experience a mild flushed feeling. This is normal. On occasion, you may experience a mild rash up to 24 hours after the test. This is not dangerous. If this occurs, you can take Benadryl 25 mg, Zyrtec, Claritin, or Allegra and increase your fluid intake. (Patients taking Tikosyn should avoid Benadryl, and may take Zyrtec, Claritin, or Allegra) If you experience trouble breathing, this can be serious. If it is severe call 911 IMMEDIATELY. If it is mild, please call our office.  We will call  to schedule your test 2-4 weeks out understanding that some insurance companies will need an authorization prior to the service being performed.   For more information and frequently asked questions, please visit our website : [email or web address]  For non-scheduling related questions, please contact the cardiac imaging nurse navigator should you have any questions/concerns: Cardiac Imaging Nurse Navigators Direct Office Dial: [phone]   For scheduling needs, including cancellations and rescheduling, please call [place], [phone].   Follow-Up: At [facility], you and your health needs are our priority.  As part of our continuing mission to provide you with exceptional heart care, we have created designated Provider Care Teams.  These Care Teams include your primary Cardiologist (physician) and Advanced Practice Providers (APPs -  Physician Assistants and Nurse Practitioners) who all work together to provide you with the care you need, when you need it.  We recommend signing up for the patient portal called MyChart.  Sign up information is provided on this After Visit Summary.  MyChart is used to connect with patients for Virtual Visits (Telemedicine).  Patients are able to view lab/test results, encounter notes, upcoming appointments, etc.  Non-urgent messages can be sent to your provider as well.   To learn more about what you  can do with MyChart, go to [email or web address].    Your next appointment:   1 month(s)  The format for your next appointment:   In Person  Provider:   Jennifer Crape, MD   Other Instructions Echocardiogram An echocardiogram is a test that uses sound waves (ultrasound) to produce images of the heart. Images from an echocardiogram can provide important information about: Heart size and shape. The size and thickness and movement of your heart's walls. Heart muscle function and strength. Heart valve function or if you have stenosis.  Stenosis is when the heart valves are too narrow. If blood is flowing backward through the heart valves (regurgitation). A tumor or infectious growth around the heart valves. Areas of heart muscle that are not working well because of poor blood flow or injury from a heart attack. Aneurysm detection. An aneurysm is a weak or damaged part of an artery wall. The wall bulges out from the normal force of blood pumping through the body. Tell a health care provider about: Any allergies you have. All medicines you are taking, including vitamins, herbs, eye drops, creams, and over-the-counter medicines. Any blood disorders you have. Any surgeries you have had. Any medical conditions you have. Whether you are pregnant or may be pregnant. What are the risks? Generally, this is a safe test. However, problems may occur, including an allergic reaction to dye (contrast) that may be used during the test. What happens before the test? No specific preparation is needed. You may eat and drink normally. What happens during the test? You will take off your clothes from the waist up and put on a hospital gown. Electrodes or electrocardiogram (ECG)patches may be placed on your chest. The electrodes or patches are then connected to a device that monitors your heart rate and rhythm. You will lie down on a table for an ultrasound exam. A gel will be applied to your chest to help sound waves pass through your skin. A handheld device, called a transducer, will be pressed against your chest and moved over your heart. The transducer produces sound waves that travel to your heart and bounce back (or echo back) to the transducer. These sound waves will be captured in real-time and changed into images of your heart that can be viewed on a video monitor. The images will be recorded on a computer and reviewed by your health care provider. You may be asked to change positions or hold your breath for a short time. This makes it  easier to get different views or better views of your heart. In some cases, you may receive contrast through an IV in one of your veins. This can improve the quality of the pictures from your heart. The procedure may vary among health care providers and hospitals.   What can I expect after the test? You may return to your normal, everyday life, including diet, activities, and medicines, unless your health care provider tells you not to do that. Follow these instructions at home: It is up to you to get the results of your test. Ask your health care provider, or the department that is doing the test, when your results will be ready. Keep all follow-up visits. This is important. Summary An echocardiogram is a test that uses sound waves (ultrasound) to produce images of the heart. Images from an echocardiogram can provide important information about the size and shape of your heart, heart muscle function, heart valve function, and other possible heart problems. You do not need to  do anything to prepare before this test. You may eat and drink normally. After the echocardiogram is completed, you may return to your normal, everyday life, unless your health care provider tells you not to do that. This information is not intended to replace advice given to you by your health care provider. Make sure you discuss any questions you have with your health care provider. Document Revised: 01/12/2020 Document Reviewed: 01/12/2020 Elsevier Patient Education  2021 Elsevier Inc.  Cardiac CT Angiogram A cardiac CT angiogram is a procedure to look at the heart and the area around the heart. It may be done to help find the cause of chest pains or other symptoms of heart disease. During this procedure, a substance called contrast dye is injected into a vein in the arm. The contrast highlights the blood vessels in the area to be checked. A large X-ray machine (CT scanner), then takes detailed pictures of the heart and  the surrounding area. The procedure is also sometimes called a coronary CT angiogram, coronary artery scanning, or CTA. A cardiac CT angiogram allows the health care provider to see how well blood is flowing to and from the heart. The provider will be able to see if there are any problems, such as: Blockage or narrowing of the arteries in the heart. Fluid around the heart. Signs of weakness or disease in the muscles, valves, and tissues of the heart. Tell a health care provider about: Any allergies you have. This is especially important if you have had a previous allergic reaction to medicines, contrast dye, or iodine. All medicines you are taking, including vitamins, herbs, eye drops, creams, and over-the-counter medicines. Any bleeding problems you have. Any surgeries you have had. Any medical conditions you have, including kidney problems or kidney failure. Whether you are pregnant or may be pregnant. Any anxiety disorders, chronic pain, or other conditions you have. These may increase your stress or prevent you from lying still. Any history of abnormal heart rhythms or heart procedures. What are the risks? Your provider will talk with you about risks. These may include: Bleeding. Infection. Allergic reactions to medicines or dyes. Damage to other structures or organs. Kidney damage from the contrast dye. Increased risk of cancer from radiation exposure. This risk is low. Talk with your provider about: The risks and benefits of testing. How you can receive the lowest dose of radiation. What happens before the procedure? Wear comfortable clothing and remove any jewelry, glasses, dentures, and hearing aids. Follow instructions from your provider about eating and drinking. These may include: 12 hours before the procedure Avoid caffeine. This includes tea, coffee, soda, energy drinks, and diet pills. Drink plenty of water or other fluids that do not have caffeine in them. Being well  hydrated can prevent complications. 4-6 hours before the procedure Stop eating and drinking. This will reduce the risk of nausea from the contrast dye. Ask your provider about changing or stopping your regular medicines. These include: Diabetes medicines. Medicines to treat problems with erections (erectile dysfunction). If you have kidney problems, you may need to receive IV hydration before and after the test. What happens during the procedure?  Hair on your chest may need to be removed so that small sticky patches called electrodes can be placed on your chest. These will transmit information that helps to monitor your heart during the procedure. An IV will be inserted into one of your veins. You might be given a medicine to control your heart rate during the procedure.  This will help to ensure that good images are obtained. You will be asked to lie on an exam table. This table will slide in and out of the CT machine during the procedure. Contrast dye will be injected into the IV. You might feel warm, or you may get a metallic taste in your mouth. You may be given medicines to relax or dilate the arteries in your heart. If you are allergic to contrast dyes or iodine you may be given medicine before the test to reduce the risk of an allergic reaction. The table that you are lying on will move into the CT machine tunnel for the scan. The person running the machine will give you instructions while the scans are being done. You may be asked to: Keep your arms above your head. Hold your breath for short periods. Stay very still, even if the table is moving. The procedure may vary among providers and hospitals. What can I expect after the procedure? After your procedure, it is common to have: A metallic taste in your mouth from the contrast dye. A feeling of warmth. A headache from the heart medicine. Follow these instructions at home: Take over-the-counter and prescription medicines only as  told by your provider. If you are told, drink enough fluid to keep your pee pale yellow. This will help to flush the contrast dye out of your body. Most people can return to their normal activities right after the procedure. Ask your provider what activities are safe for you. It is up to you to get the results of your procedure. Ask your provider, or the department that is doing the procedure, when your results will be ready. Contact a health care provider if: You have any symptoms of allergy to the contrast dye. These include: Shortness of breath. Rash or hives. A racing heartbeat. You notice a change in your peeing (urination). This information is not intended to replace advice given to you by your health care provider. Make sure you discuss any questions you have with your health care provider. Document Revised: 12/22/2021 Document Reviewed: 12/22/2021 Elsevier Patient Education  2024 Elsevier Inc. Important Information About Sugar

## 2023-12-30 ENCOUNTER — Ambulatory Visit (HOSPITAL_COMMUNITY): Payer: Self-pay

## 2023-12-31 ENCOUNTER — Encounter (HOSPITAL_COMMUNITY): Payer: Self-pay

## 2023-12-31 LAB — LAB REPORT - SCANNED
A1c: 6.4
EGFR: 77

## 2024-01-01 ENCOUNTER — Telehealth (HOSPITAL_COMMUNITY): Payer: Self-pay | Admitting: Emergency Medicine

## 2024-01-01 NOTE — Telephone Encounter (Signed)
 Reaching out to patient to offer assistance regarding upcoming cardiac imaging study; pt verbalizes understanding of appt date/time, parking situation and where to check in, pre-test NPO status and medications ordered, and verified current allergies; name and call back number provided for further questions should they arise [place]well [place] RN Navigator Cardiac Imaging [facility] [phone] office [phone] cell

## 2024-01-03 ENCOUNTER — Ambulatory Visit (HOSPITAL_BASED_OUTPATIENT_CLINIC_OR_DEPARTMENT_OTHER)
Admission: RE | Admit: 2024-01-03 | Discharge: 2024-01-03 | Disposition: A | Discharge status: Home / Self Care | Payer: Self-pay | Source: Ambulatory Visit | Attending: Cardiology | Admitting: Cardiology

## 2024-01-03 ENCOUNTER — Ambulatory Visit (HOSPITAL_COMMUNITY)
Admission: RE | Admit: 2024-01-03 | Discharge: 2024-01-03 | Disposition: A | Discharge status: Home / Self Care | Payer: Self-pay | Source: Ambulatory Visit | Attending: Cardiology | Admitting: Cardiology

## 2024-01-03 ENCOUNTER — Other Ambulatory Visit: Payer: Self-pay | Admitting: Cardiology

## 2024-01-03 DIAGNOSIS — R931 Abnormal findings on diagnostic imaging of heart and coronary circulation: Secondary | ICD-10-CM

## 2024-01-03 DIAGNOSIS — I2510 Atherosclerotic heart disease of native coronary artery without angina pectoris: Secondary | ICD-10-CM

## 2024-01-03 DIAGNOSIS — R0609 Other forms of dyspnea: Secondary | ICD-10-CM | POA: Insufficient documentation

## 2024-01-03 MED ORDER — NITROGLYCERIN 0.4 MG SL SUBL
SUBLINGUAL_TABLET | SUBLINGUAL | Status: AC
  Filled 2024-01-03: qty 2

## 2024-01-03 MED ORDER — NITROGLYCERIN 0.4 MG SL SUBL
0.8000 mg | SUBLINGUAL_TABLET | Freq: Once | SUBLINGUAL | Status: AC
  Administered 2024-01-03: 0.8 mg via SUBLINGUAL

## 2024-01-03 MED ORDER — IOHEXOL 350 MG/ML SOLN
100.0000 mL | Freq: Once | INTRAVENOUS | Status: AC | PRN
  Administered 2024-01-03: 100 mL via INTRAVENOUS

## 2024-01-05 ENCOUNTER — Ambulatory Visit: Payer: Self-pay | Admitting: Cardiology

## 2024-01-07 ENCOUNTER — Ambulatory Visit: Payer: Self-pay | Admitting: Cardiology

## 2024-01-07 ENCOUNTER — Ambulatory Visit: Payer: Self-pay | Attending: Cardiology | Admitting: Cardiology

## 2024-01-07 ENCOUNTER — Other Ambulatory Visit: Payer: Self-pay

## 2024-01-07 VITALS — BP 150/99 | HR 83 | Ht 73.0 in | Wt 287.0 lb

## 2024-01-07 DIAGNOSIS — D1803 Hemangioma of intra-abdominal structures: Secondary | ICD-10-CM | POA: Insufficient documentation

## 2024-01-07 DIAGNOSIS — H409 Unspecified glaucoma: Secondary | ICD-10-CM | POA: Insufficient documentation

## 2024-01-07 DIAGNOSIS — Z973 Presence of spectacles and contact lenses: Secondary | ICD-10-CM | POA: Insufficient documentation

## 2024-01-07 DIAGNOSIS — R0609 Other forms of dyspnea: Secondary | ICD-10-CM

## 2024-01-07 DIAGNOSIS — Z68.41 Body mass index (BMI) pediatric, 120% of the 95th percentile for age to less than 140% of the 95th percentile for age: Secondary | ICD-10-CM

## 2024-01-07 DIAGNOSIS — M7630 Iliotibial band syndrome, unspecified leg: Secondary | ICD-10-CM | POA: Insufficient documentation

## 2024-01-07 DIAGNOSIS — I440 Atrioventricular block, first degree: Secondary | ICD-10-CM | POA: Insufficient documentation

## 2024-01-07 DIAGNOSIS — H269 Unspecified cataract: Secondary | ICD-10-CM | POA: Insufficient documentation

## 2024-01-07 DIAGNOSIS — R931 Abnormal findings on diagnostic imaging of heart and coronary circulation: Secondary | ICD-10-CM

## 2024-01-07 DIAGNOSIS — E7800 Pure hypercholesterolemia, unspecified: Secondary | ICD-10-CM

## 2024-01-07 DIAGNOSIS — I2510 Atherosclerotic heart disease of native coronary artery without angina pectoris: Secondary | ICD-10-CM

## 2024-01-07 DIAGNOSIS — M1990 Unspecified osteoarthritis, unspecified site: Secondary | ICD-10-CM | POA: Insufficient documentation

## 2024-01-07 DIAGNOSIS — G4733 Obstructive sleep apnea (adult) (pediatric): Secondary | ICD-10-CM | POA: Insufficient documentation

## 2024-01-07 HISTORY — DX: Atherosclerotic heart disease of native coronary artery without angina pectoris: I25.10

## 2024-01-07 MED ORDER — ASPIRIN 81 MG PO TBEC: 81.0000 mg | DELAYED_RELEASE_TABLET | Freq: Every day | ORAL | 3 refills | Status: AC

## 2024-01-07 MED ORDER — NITROGLYCERIN 0.4 MG SL SUBL: 0.4000 mg | SUBLINGUAL_TABLET | SUBLINGUAL | 6 refills | Status: AC | PRN

## 2024-01-07 MED ORDER — ROSUVASTATIN CALCIUM 20 MG PO TABS
20.0000 mg | ORAL_TABLET | Freq: Every day | ORAL | 3 refills | Status: AC
Start: 2024-01-07 — End: 2024-04-06

## 2024-01-07 NOTE — Telephone Encounter (Signed)
-----   Message from Jennifer SAUNDERS Revankar sent at 01/05/2024  2:06 PM EDT ----- Needs ecasa and ntg. Needs appt to see me Jennifer SAUNDERS Crape, MD 01/05/2024 2:05 PM  ----- Message ----- From: Kate Lonni CROME, MD Sent: 01/03/2024   6:00 PM EDT To: Jennifer SAUNDERS Crape, MD   ----- Message ----- From: Interface, Rad Results In Sent: 01/03/2024   5:52 PM EDT To: Lonni CROME Kate, MD

## 2024-01-07 NOTE — H&P (View-Only) (Signed)
 Cardiology Office Note:    Date:  01/07/2024   ID:  Andre Decker, DOB 07-30-1970, MRN [id number]  PCP:  Verena Mems, MD  Cardiologist:  Jennifer JONELLE Crape, MD   Referring MD: Verena Mems, MD    ASSESSMENT:    1. Pure hypercholesterolemia   2. DOE (dyspnea on exertion)   3. Abnormal findings diagnostic imaging of heart and coronary circulation   4. Coronary artery disease involving native coronary artery of native heart, unspecified whether angina present   5. Severe obesity due to excess calories with serious comorbidity and body mass index (BMI) 120% of 95th percentile to less than 140% of 95th percentile for age in pediatric patient [facility])    PLAN:    In order of problems listed above:  Dyspnea on exertion: CT coronary angiography: Patient's findings are significant.  Patient does symptomatic.  Following recommendations were made to the patient for this reason.  He was advised to take a coated baby aspirin  on a daily basis.  Sublingual nitroglycerin  prescription was sent, its protocol and 911 protocol explained and the patient vocalized understanding questions were answered to the patient's satisfaction.I discussed coronary angiography and left heart catheterization with the patient at extensive length. Procedure, benefits and potential risks were explained. Patient had multiple questions which were answered to the patient's satisfaction. Patient agreed and consented for the procedure. Further recommendations will be made based on the findings of the coronary angiography. In the interim. The patient has any significant symptoms he knows to go to the nearest emergency room. Essential hypertension: Blood pressure stable and diet was emphasized. Statin therapy: He needs it by guidelines.  I will do blood work today I will check his LFTs.  He will initiate himself on statin therapy if his LFTs are fine. Obesity: Weight reduction stressed diet emphasized and he promises to do  better.  He will be seen in follow-up appointment after the coronary angiography.    Medication Adjustments/Labs and Tests Ordered: Current medicines are reviewed at length with the patient today.  Concerns regarding medicines are outlined above.  Orders Placed This Encounter  Procedures   EKG 12-Lead   No orders of the defined types were placed in this encounter.    No chief complaint on file.    History of Present Illness:    Andre Decker is a 53 y.o. male.  Patient was evaluated for dyspnea on exertion.  Patient has history of essential hypertension.  He is obese and leads a sedentary lifestyle.  His evaluation revealed significant coronary artery disease with possibility of obstructive disease.  Patient was called to review this findings.  He is accompanied by his mother and stepdad.  His dad was on the phone.  At the time of my evaluation, the patient is alert awake oriented and in no distress.  Past Medical History:  Diagnosis Date   1st degree AV block    Abdominal pain 12/19/2015   Abnormal LFTs (liver function tests) 12/19/2015   Allergic rhinitis 11/09/2020   Biliary colic    Biliary dyskinesia 11/09/2020   Bradycardia 09/26/2016   Cataract    DOE (dyspnea on exertion) 12/17/2023   Erectile dysfunction 11/09/2020   Fatty liver 06/25/2018   Glaucoma of both eyes    H/O vitamin D deficiency 06/25/2018   HEMANGIOMA, HEPATIC 09/06/2007   Qualifier: Diagnosis of   By: Myriam RN, Maudie         HYPERTENSION 09/06/2007   Qualifier: Diagnosis of   By:  Myriam RN, Maudie      Replacing diagnoses that were inactivated after the 09/03/22 regulatory import     Impaired glucose metabolism 06/25/2018   IRRITABLE BOWEL SYNDROME 05/23/2010   Qualifier: Diagnosis of   By: Jakie MD NOLIA Lenis R        IT band syndrome    Liver hemangioma    dx 2002   No advance directive on file 01/03/2016   Non-insertional Achilles tendinopathy 10/15/2019   OBESITY, UNSPECIFIED  03/15/2009   Qualifier: Diagnosis of   By: Jakie MD NOLIA Lenis R        Obstructive sleep apnea syndrome 11/09/2020   OSA on CPAP    moderate osa per study 05-21-2004   Osteoarthritis    Pain of left heel 11/03/2019   Pancreas divisum 12/19/2015   Paresthesia of left foot 04/20/2015   Prediabetes 11/09/2020   Pure hypercholesterolemia 11/09/2020   S/P cholecystectomy 03/12/2017   Sciatica 11/09/2020   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   Wears contact lenses     Past Surgical History:  Procedure Laterality Date   ACHILLES TENDON REPAIR Left 02/2020   CATARACT EXTRACTION W/ INTRAOCULAR LENS IMPLANT Left 01/18/2012   CHOLECYSTECTOMY N/A 11/01/2016   Procedure: LAPAROSCOPIC CHOLECYSTECTOMY;  Surgeon: Stevie Herlene Righter, MD;  Location: [facility];  Service: General;  Laterality: N/A;  WILL NEED ANESTHESIA CONSULT PRIOR TO SURGERY - SEE NOTE IN SPECIAL NEEDS SECTION   COLONOSCOPY  2014   2nd colonoscopy 08/2019   COLONOSCOPY WITH ESOPHAGOGASTRODUODENOSCOPY (EGD)  last one 11-24-2012   RETINAL DETACHMENT REPAIR W/ SCLERAL BUCKLE LE Bilateral 09/23/2009   Left eye and Laser for Retinal breaks Right eye   UPPER ESOPHAGEAL ENDOSCOPIC ULTRASOUND (EUS)  2017    [facility]   WRIST FRACTURE SURGERY Right     Current Medications: Current Meds  Medication Sig   bimatoprost (LUMIGAN) 0.01 % SOLN Place 1 drop into both eyes at bedtime.    brimonidine-timolol (COMBIGAN) 0.2-0.5 % ophthalmic solution Place 1 drop into both eyes 2 (two) times daily.    hydrALAZINE (APRESOLINE) 25 MG tablet Take 25 mg by mouth 3 (three) times daily.   RHOPRESSA 0.02 % SOLN Place 1 drop into both eyes daily.   tacrolimus (PROTOPIC) 0.1 % ointment Apply 1 application topically 2 (two) times daily.   valsartan-hydrochlorothiazide (DIOVAN-HCT) 320-12.5 MG tablet Take 1 tablet by mouth daily.     Allergies:   Amlodipine and Blood-group specific substance   Social History   Socioeconomic History   Marital status: Married     Spouse name: Newell   Number of children: 1   Years of education: 12   Highest education level: Not on file  Occupational History   Occupation: TRANSPORTATION    Employer: [employer]  Tobacco Use   Smoking status: Never   Smokeless tobacco: Never  Vaping Use   Vaping status: Never Used  Substance and Sexual Activity   Alcohol use: No    Alcohol/week: 0.0 standard drinks of alcohol   Drug use: No   Sexual activity: Yes    Partners: Female  Other Topics Concern   Not on file  Social History Narrative   Lives with wife, Newell   Patient does not drink caffeine.   Patient is left handed.    Social Drivers of Corporate investment banker Strain: Not on file  Food Insecurity: Not on file  Transportation Needs: Not on file  Physical Activity: Not on file  Stress: Not on file  Social  Connections: Not on file     Family History: The patient's family history includes Arthritis/Rheumatoid in his maternal grandmother and paternal grandmother; Cancer in his maternal grandmother; Migraines in his mother; Prostate cancer in his maternal grandfather and maternal uncle; Stroke in his paternal grandfather. There is no history of Stomach cancer, Colon cancer, Colon polyps, Rectal cancer, or Esophageal cancer.  ROS:   Please see the history of present illness.    All other systems reviewed and are negative.  EKGs/Labs/Other Studies Reviewed:    The following studies were reviewed today: .SABRAEKG Interpretation Date/Time:  Tuesday January 07 2024 15:50:31 EDT Ventricular Rate:  83 PR Interval:  192 QRS Duration:  94 QT Interval:  372 QTC Calculation: 437 R Axis:   76  Text Interpretation: Normal sinus rhythm Inferior infarct , age undetermined Cannot rule out Anterior infarct , age undetermined When compared with ECG of 28-Nov-2023 20:12, PREVIOUS ECG IS PRESENT Confirmed by Edwyna Backers [id number]) on 01/07/2024 4:03:29 PM     Recent Labs: 11/28/2023: ALT 30; BUN 10;  Creatinine, Ser 1.10; Hemoglobin 11.6; Magnesium  1.6; Platelets 188; Potassium 2.9; Sodium 148; TSH 3.387  Recent Lipid Panel No results found for: CHOL, TRIG, HDL, CHOLHDL, VLDL, LDLCALC, LDLDIRECT  Physical Exam:    VS:  BP (!) 150/99   Pulse 83   Ht 6' 1 (1.854 m)   Wt 287 lb (130.2 kg)   SpO2 96%   BMI 37.87 kg/m     Wt Readings from Last 3 Encounters:  01/07/24 287 lb (130.2 kg)  12/17/23 283 lb 12.8 oz (128.7 kg)  06/20/22 286 lb 6.4 oz (129.9 kg)     GEN: Patient is in no acute distress HEENT: Normal NECK: No JVD; No carotid bruits LYMPHATICS: No lymphadenopathy CARDIAC: Hear sounds regular, 2/6 systolic murmur at the apex. RESPIRATORY:  Clear to auscultation without rales, wheezing or rhonchi  ABDOMEN: Soft, non-tender, non-distended MUSCULOSKELETAL:  No edema; No deformity  SKIN: Warm and dry NEUROLOGIC:  Alert and oriented x 3 PSYCHIATRIC:  Normal affect   Signed, Backers JONELLE Edwyna, MD  01/07/2024 4:16 PM    [facility]

## 2024-01-07 NOTE — Telephone Encounter (Signed)
 Left voice message to return call.  Pt needs an appointment to discuss cardiac CT results as soon as possible.

## 2024-01-07 NOTE — Progress Notes (Signed)
 Cardiology Office Note:    Date:  01/07/2024   ID:  Andre Decker, DOB 07-30-1970, MRN [id number]  PCP:  Verena Mems, MD  Cardiologist:  Jennifer JONELLE Crape, MD   Referring MD: Verena Mems, MD    ASSESSMENT:    1. Pure hypercholesterolemia   2. DOE (dyspnea on exertion)   3. Abnormal findings diagnostic imaging of heart and coronary circulation   4. Coronary artery disease involving native coronary artery of native heart, unspecified whether angina present   5. Severe obesity due to excess calories with serious comorbidity and body mass index (BMI) 120% of 95th percentile to less than 140% of 95th percentile for age in pediatric patient [facility])    PLAN:    In order of problems listed above:  Dyspnea on exertion: CT coronary angiography: Patient's findings are significant.  Patient does symptomatic.  Following recommendations were made to the patient for this reason.  He was advised to take a coated baby aspirin  on a daily basis.  Sublingual nitroglycerin  prescription was sent, its protocol and 911 protocol explained and the patient vocalized understanding questions were answered to the patient's satisfaction.I discussed coronary angiography and left heart catheterization with the patient at extensive length. Procedure, benefits and potential risks were explained. Patient had multiple questions which were answered to the patient's satisfaction. Patient agreed and consented for the procedure. Further recommendations will be made based on the findings of the coronary angiography. In the interim. The patient has any significant symptoms he knows to go to the nearest emergency room. Essential hypertension: Blood pressure stable and diet was emphasized. Statin therapy: He needs it by guidelines.  I will do blood work today I will check his LFTs.  He will initiate himself on statin therapy if his LFTs are fine. Obesity: Weight reduction stressed diet emphasized and he promises to do  better.  He will be seen in follow-up appointment after the coronary angiography.    Medication Adjustments/Labs and Tests Ordered: Current medicines are reviewed at length with the patient today.  Concerns regarding medicines are outlined above.  Orders Placed This Encounter  Procedures   EKG 12-Lead   No orders of the defined types were placed in this encounter.    No chief complaint on file.    History of Present Illness:    Andre Decker is a 53 y.o. male.  Patient was evaluated for dyspnea on exertion.  Patient has history of essential hypertension.  He is obese and leads a sedentary lifestyle.  His evaluation revealed significant coronary artery disease with possibility of obstructive disease.  Patient was called to review this findings.  He is accompanied by his mother and stepdad.  His dad was on the phone.  At the time of my evaluation, the patient is alert awake oriented and in no distress.  Past Medical History:  Diagnosis Date   1st degree AV block    Abdominal pain 12/19/2015   Abnormal LFTs (liver function tests) 12/19/2015   Allergic rhinitis 11/09/2020   Biliary colic    Biliary dyskinesia 11/09/2020   Bradycardia 09/26/2016   Cataract    DOE (dyspnea on exertion) 12/17/2023   Erectile dysfunction 11/09/2020   Fatty liver 06/25/2018   Glaucoma of both eyes    H/O vitamin D deficiency 06/25/2018   HEMANGIOMA, HEPATIC 09/06/2007   Qualifier: Diagnosis of   By: Myriam RN, Maudie         HYPERTENSION 09/06/2007   Qualifier: Diagnosis of   By:  Myriam RN, Maudie      Replacing diagnoses that were inactivated after the 09/03/22 regulatory import     Impaired glucose metabolism 06/25/2018   IRRITABLE BOWEL SYNDROME 05/23/2010   Qualifier: Diagnosis of   By: Jakie MD NOLIA Lenis R        IT band syndrome    Liver hemangioma    dx 2002   No advance directive on file 01/03/2016   Non-insertional Achilles tendinopathy 10/15/2019   OBESITY, UNSPECIFIED  03/15/2009   Qualifier: Diagnosis of   By: Jakie MD NOLIA Lenis R        Obstructive sleep apnea syndrome 11/09/2020   OSA on CPAP    moderate osa per study 05-21-2004   Osteoarthritis    Pain of left heel 11/03/2019   Pancreas divisum 12/19/2015   Paresthesia of left foot 04/20/2015   Prediabetes 11/09/2020   Pure hypercholesterolemia 11/09/2020   S/P cholecystectomy 03/12/2017   Sciatica 11/09/2020   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   Wears contact lenses     Past Surgical History:  Procedure Laterality Date   ACHILLES TENDON REPAIR Left 02/2020   CATARACT EXTRACTION W/ INTRAOCULAR LENS IMPLANT Left 01/18/2012   CHOLECYSTECTOMY N/A 11/01/2016   Procedure: LAPAROSCOPIC CHOLECYSTECTOMY;  Surgeon: Stevie Herlene Righter, MD;  Location: [facility];  Service: General;  Laterality: N/A;  WILL NEED ANESTHESIA CONSULT PRIOR TO SURGERY - SEE NOTE IN SPECIAL NEEDS SECTION   COLONOSCOPY  2014   2nd colonoscopy 08/2019   COLONOSCOPY WITH ESOPHAGOGASTRODUODENOSCOPY (EGD)  last one 11-24-2012   RETINAL DETACHMENT REPAIR W/ SCLERAL BUCKLE LE Bilateral 09/23/2009   Left eye and Laser for Retinal breaks Right eye   UPPER ESOPHAGEAL ENDOSCOPIC ULTRASOUND (EUS)  2017    [facility]   WRIST FRACTURE SURGERY Right     Current Medications: Current Meds  Medication Sig   bimatoprost (LUMIGAN) 0.01 % SOLN Place 1 drop into both eyes at bedtime.    brimonidine-timolol (COMBIGAN) 0.2-0.5 % ophthalmic solution Place 1 drop into both eyes 2 (two) times daily.    hydrALAZINE (APRESOLINE) 25 MG tablet Take 25 mg by mouth 3 (three) times daily.   RHOPRESSA 0.02 % SOLN Place 1 drop into both eyes daily.   tacrolimus (PROTOPIC) 0.1 % ointment Apply 1 application topically 2 (two) times daily.   valsartan-hydrochlorothiazide (DIOVAN-HCT) 320-12.5 MG tablet Take 1 tablet by mouth daily.     Allergies:   Amlodipine and Blood-group specific substance   Social History   Socioeconomic History   Marital status: Married     Spouse name: Newell   Number of children: 1   Years of education: 12   Highest education level: Not on file  Occupational History   Occupation: TRANSPORTATION    Employer: [employer]  Tobacco Use   Smoking status: Never   Smokeless tobacco: Never  Vaping Use   Vaping status: Never Used  Substance and Sexual Activity   Alcohol use: No    Alcohol/week: 0.0 standard drinks of alcohol   Drug use: No   Sexual activity: Yes    Partners: Female  Other Topics Concern   Not on file  Social History Narrative   Lives with wife, Newell   Patient does not drink caffeine.   Patient is left handed.    Social Drivers of Corporate investment banker Strain: Not on file  Food Insecurity: Not on file  Transportation Needs: Not on file  Physical Activity: Not on file  Stress: Not on file  Social  Connections: Not on file     Family History: The patient's family history includes Arthritis/Rheumatoid in his maternal grandmother and paternal grandmother; Cancer in his maternal grandmother; Migraines in his mother; Prostate cancer in his maternal grandfather and maternal uncle; Stroke in his paternal grandfather. There is no history of Stomach cancer, Colon cancer, Colon polyps, Rectal cancer, or Esophageal cancer.  ROS:   Please see the history of present illness.    All other systems reviewed and are negative.  EKGs/Labs/Other Studies Reviewed:    The following studies were reviewed today: .SABRAEKG Interpretation Date/Time:  Tuesday January 07 2024 15:50:31 EDT Ventricular Rate:  83 PR Interval:  192 QRS Duration:  94 QT Interval:  372 QTC Calculation: 437 R Axis:   76  Text Interpretation: Normal sinus rhythm Inferior infarct , age undetermined Cannot rule out Anterior infarct , age undetermined When compared with ECG of 28-Nov-2023 20:12, PREVIOUS ECG IS PRESENT Confirmed by Edwyna Backers [id number]) on 01/07/2024 4:03:29 PM     Recent Labs: 11/28/2023: ALT 30; BUN 10;  Creatinine, Ser 1.10; Hemoglobin 11.6; Magnesium  1.6; Platelets 188; Potassium 2.9; Sodium 148; TSH 3.387  Recent Lipid Panel No results found for: CHOL, TRIG, HDL, CHOLHDL, VLDL, LDLCALC, LDLDIRECT  Physical Exam:    VS:  BP (!) 150/99   Pulse 83   Ht 6' 1 (1.854 m)   Wt 287 lb (130.2 kg)   SpO2 96%   BMI 37.87 kg/m     Wt Readings from Last 3 Encounters:  01/07/24 287 lb (130.2 kg)  12/17/23 283 lb 12.8 oz (128.7 kg)  06/20/22 286 lb 6.4 oz (129.9 kg)     GEN: Patient is in no acute distress HEENT: Normal NECK: No JVD; No carotid bruits LYMPHATICS: No lymphadenopathy CARDIAC: Hear sounds regular, 2/6 systolic murmur at the apex. RESPIRATORY:  Clear to auscultation without rales, wheezing or rhonchi  ABDOMEN: Soft, non-tender, non-distended MUSCULOSKELETAL:  No edema; No deformity  SKIN: Warm and dry NEUROLOGIC:  Alert and oriented x 3 PSYCHIATRIC:  Normal affect   Signed, Backers JONELLE Edwyna, MD  01/07/2024 4:16 PM    [facility]

## 2024-01-07 NOTE — Patient Instructions (Signed)
 Medication Instructions:  Your physician has recommended you make the following change in your medication:   Take 81 mg coated aspirin  daily.  Start Rosuvastatin  20 mg daily  Use nitroglycerin  1 tablet placed under the tongue at the first sign of chest pain or an angina attack. 1 tablet may be used every 5 minutes as needed, for up to 15 minutes. Do not take more than 3 tablets in 15 minutes. If pain persist call 911 or go to the nearest ED.   *If you need a refill on your cardiac medications before your next appointment, please call your pharmacy*   Lab Work: Your physician recommends that you have a BMET and CBC today in the office for your upcoming procedure.  [facility] [address] [place], [place] [address]  If you have labs (blood work) drawn today and your tests are completely normal, you will receive your results only by: MyChart Message (if you have MyChart) OR A paper copy in the mail If you have any lab test that is abnormal or we need to change your treatment, we will call you to review the results.   Testing/Procedures:  [facility] DEPT OF [facility] [place] [address] [place], [place] [address] [place] [address] Dept: [phone] Loc: [phone]  Andre Decker  01/07/2024  You are scheduled for a Cardiac Catheterization on Thursday, August 7 with Dr. Deatrice Cage.  1. Please arrive at the [facility] (Main Entrance A) at [facility]: [address] [place], [place] [address] at 8:30 AM (This time is 2 hour(s) before your procedure to ensure your preparation).   Free valet parking service is available. You will check in at ADMITTING. The support person will be asked to wait in the waiting room.  It is OK to have someone drop you off and come back when you are ready to be discharged.    Special note: Every effort is made to have your procedure done on time. Please understand that  emergencies sometimes delay scheduled procedures.  2. Diet: No solid foods after midnight. You may have clear liquids until you arrive at the hospital.  List of approved liquids water , clear juice, clear tea, black coffee, fruit juices, non-citric and without pulp, carbonated beverages, Gatorade, Kool -Aid, plain Jello-O and plain ice popsicles.   3. Hydration: You need to be well hydrated before your procedure time. You may drink approved liquids (see below) until you arrive at the hospital. On the way to the hospital, please drink a 16-oz (1 plastic bottle) of water .   List of approved liquids water , clear juice, clear tea, black coffee, fruit juices, non-citric and without pulp, carbonated beverages, Gatorade, Kool -Aid, plain Jello-O and plain ice popsicles.   4. Labs: You had labs done today  5. Medication instructions in preparation for your procedure:   Contrast Allergy: No  Stop taking, Diovan (Valsartan) Thursday, August 7,  On the morning of your procedure, take your Aspirin  81 mg and any morning medicines NOT listed above.  You may use sips of water .  6. Plan to go home the same day, you will only stay overnight if medically necessary. 7. Bring a current list of your medications and current insurance cards. 8. You MUST have a responsible person to drive you home. 9. Someone MUST be with you the first 24 hours after you arrive home or your discharge will be delayed. 10. Please wear  clothes that are easy to get on and off and wear slip-on shoes.  Thank you for allowing us  to care for you!   -- [facility]    Follow-Up: At [facility], you and your health needs are our priority.  As part of our continuing mission to provide you with exceptional heart care, we have created designated Provider Care Teams.  These Care Teams include your primary Cardiologist (physician) and Advanced Practice Providers (APPs -  Physician Assistants and Nurse  Practitioners) who all work together to provide you with the care you need, when you need it.  We recommend signing up for the patient portal called MyChart.  Sign up information is provided on this After Visit Summary.  MyChart is used to connect with patients for Virtual Visits (Telemedicine).  Patients are able to view lab/test results, encounter notes, upcoming appointments, etc.  Non-urgent messages can be sent to your provider as well.   To learn more about what you can do with MyChart, go to [email or web address].    Your next appointment:   4 week(s)  The format for your next appointment:   In Person  Provider:   Jennifer Crape, MD   Other Instructions  Coronary Angiogram With Stent Coronary angiogram with stent placement is a procedure to widen or open a narrow blood vessel of the heart (coronary artery). Arteries may become blocked by cholesterol buildup (plaques) in the lining of the artery wall. When a coronary artery becomes partially blocked, blood flow to that area decreases. This may lead to chest pain or a heart attack (myocardial infarction). A stent is a small piece of metal that looks like mesh or spring. Stent placement may be done as treatment after a heart attack, or to prevent a heart attack if a blocked artery is found by a coronary angiogram. Let your health care provider know about: Any allergies you have, including allergies to medicines or contrast dye. All medicines you are taking, including vitamins, herbs, eye drops, creams, and over-the-counter medicines. Any problems you or family members have had with anesthetic medicines. Any blood disorders you have. Any surgeries you have had. Any medical conditions you have, including kidney problems or kidney failure. Whether you are pregnant or may be pregnant. Whether you are breastfeeding. What are the risks? Generally, this is a safe procedure. However, serious problems may occur, including: Damage to  nearby structures or organs, such as the heart, blood vessels, or kidneys. A return of blockage. Bleeding, infection, or bruising at the insertion site. A collection of blood under the skin (hematoma) at the insertion site. A blood clot in another part of the body. Allergic reaction to medicines or dyes. Bleeding into the abdomen (retroperitoneal bleeding). Stroke (rare). Heart attack (rare). What happens before the procedure? Staying hydrated Follow instructions from your health care provider about hydration, which may include: Up to 2 hours before the procedure - you may continue to drink clear liquids, such as water , clear fruit juice, black coffee, and plain tea.    Eating and drinking restrictions Follow instructions from your health care provider about eating and drinking, which may include: 8 hours before the procedure - stop eating heavy meals or foods, such as meat, fried foods, or fatty foods. 6 hours before the procedure - stop eating light meals or foods, such as toast or cereal. 2 hours before the procedure - stop drinking clear liquids. Medicines Ask your health care provider about: Changing or stopping your regular medicines.  This is especially important if you are taking diabetes medicines or blood thinners. Taking medicines such as aspirin  and ibuprofen . These medicines can thin your blood. Do not take these medicines unless your health care provider tells you to take them. Generally, aspirin  is recommended before a thin tube, called a catheter, is passed through a blood vessel and inserted into the heart (cardiac catheterization). Taking over-the-counter medicines, vitamins, herbs, and supplements. General instructions Do not use any products that contain nicotine or tobacco for at least 4 weeks before the procedure. These products include cigarettes, e-cigarettes, and chewing tobacco. If you need help quitting, ask your health care provider. Plan to have someone take you  home from the hospital or clinic. If you will be going home right after the procedure, plan to have someone with you for 24 hours. You may have tests and imaging procedures. Ask your health care provider: How your insertion site will be marked. Ask which artery will be used for the procedure. What steps will be taken to help prevent infection. These may include: Removing hair at the insertion site. Washing skin with a germ-killing soap. Taking antibiotic medicine. What happens during the procedure? An IV will be inserted into one of your veins. Electrodes may be placed on your chest to monitor your heart rate during the procedure. You will be given one or more of the following: A medicine to help you relax (sedative). A medicine to numb the area (local anesthetic) for catheter insertion. A small incision will be made for catheter insertion. The catheter will be inserted into an artery using a guide wire. The location may be in your groin, your wrist, or the fold of your arm (near your elbow). An X-ray procedure (fluoroscopy) will be used to help guide the catheter to the opening of the heart arteries. A dye will be injected into the catheter. X-rays will be taken. The dye helps to show where any narrowing or blockages are located in the arteries. Tell your health care provider if you have chest pain or trouble breathing. A tiny wire will be guided to the blocked spot, and a balloon will be inflated to make the artery wider. The stent will be expanded to crush the plaques into the wall of the vessel. The stent will hold the area open and improve the blood flow. Most stents have a drug coating to reduce the risk of the stent narrowing over time. The artery may be made wider using a drill, laser, or other tools that remove plaques. The catheter will be removed when the blood flow improves. The stent will stay where it was placed, and the lining of the artery will grow over it. A bandage  (dressing) will be placed on the insertion site. Pressure will be applied to stop bleeding. The IV will be removed. This procedure may vary among health care providers and hospitals.    What happens after the procedure? Your blood pressure, heart rate, breathing rate, and blood oxygen level will be monitored until you leave the hospital or clinic. If the procedure is done through the leg, you will lie flat in bed for a few hours or for as long as told by your health care provider. You will be instructed not to bend or cross your legs. The insertion site and the pulse in your foot or wrist will be checked often. You may have more blood tests, X-rays, and a test that records the electrical activity of your heart (electrocardiogram, or ECG). Do  not drive for 24 hours if you were given a sedative during your procedure. Summary Coronary angiogram with stent placement is a procedure to widen or open a narrowed coronary artery. This is done to treat heart problems. Before the procedure, let your health care provider know about all the medical conditions and surgeries you have or have had. This is a safe procedure. However, some problems may occur, including damage to nearby structures or organs, bleeding, blood clots, or allergies. Follow your health care provider's instructions about eating, drinking, medicines, and other lifestyle changes, such as quitting tobacco use before the procedure. This information is not intended to replace advice given to you by your health care provider. Make sure you discuss any questions you have with your health care provider. Document Revised: 12/10/2018 Document Reviewed: 12/10/2018 Elsevier Patient Education  2021 Elsevier Inc.  Aspirin  and Your Heart Aspirin  is a medicine that prevents the platelets in your blood from sticking together. Platelets are the cells that your blood uses for clotting. Aspirin  can be used to help reduce the risk of blood clots, heart  attacks, and other heart-related problems. What are the risks? Daily use of aspirin  can cause side effects. Some of these include: Bleeding. Bleeding can be minor or serious. An example of minor bleeding is bleeding from a cut, and the bleeding does not stop. An example of more serious bleeding is stomach bleeding or, rarely, bleeding into the brain. Your risk of bleeding increases if you are also taking NSAIDs, such as ibuprofen . Increased bruising. Upset stomach. An allergic reaction. People who have growths inside the nose (nasal polyps) have an increased risk of developing an aspirin  allergy. How to use aspirin  to care for your heart Take aspirin  only as told by your health care provider. Make sure that you understand how much to take and what form to take. The two forms of aspirin  are: Non-enteric-coated.This type of aspirin  does not have a coating and is absorbed quickly. This type of aspirin  also comes in a chewable form. Enteric-coated. This type of aspirin  has a coating that releases the medicine very slowly. Enteric-coated aspirin  might cause less stomach upset than non-enteric-coated aspirin . This type of aspirin  should not be chewed or crushed. Work with your health care provider to find out whether it is safe and beneficial for you to take aspirin  daily. Taking aspirin  daily may be helpful if: You have had a heart attack or chest pain, or you are at risk for a heart attack. You have a condition in which certain heart vessels are blocked (coronary artery disease), and you have had a procedure to treat it. Examples are: Open-heart surgery, such as coronary artery bypass surgery (CABG). Coronary angioplasty,which is done to widen a blood vessel of your heart. Having a small mesh tube, or stent, placed in your coronary artery. You have had certain types of stroke or a mini-stroke known as a transient ischemic attack (TIA). You have a narrowing of the arteries that supply the limbs  (peripheral artery disease, or PAD). You have long-term (chronic) heart rhythm problems, such as atrial fibrillation, and your health care provider thinks aspirin  may help. You have valve disease or have had surgery on a valve. You are considered at increased risk of developing coronary artery disease or PAD.    Follow these instructions at home Medicines Take over-the-counter and prescription medicines only as told by your health care provider. If you are taking blood thinners: Talk with your health care provider before you  take any medicines that contain aspirin  or NSAIDs, such as ibuprofen . These medicines increase your risk for dangerous bleeding. Take your medicine exactly as told, at the same time every day. Avoid activities that could cause injury or bruising, and follow instructions about how to prevent falls. Wear a medical alert bracelet or carry a card that lists what medicines you take. General instructions Do not drink alcohol if: Your health care provider tells you not to drink. You are pregnant, may be pregnant, or are planning to become pregnant. If you drink alcohol: Limit how much you use to: 0-1 drink a day for women. 0-2 drinks a day for men. Be aware of how much alcohol is in your drink. In the [place] one drink equals one 12 oz bottle of beer (355 mL), one 5 oz glass of wine (148 mL), or one 1 oz glass of hard liquor (44 mL). Keep all follow-up visits as told by your health care provider. This is important. Where to find more information The American Heart Association: www.heart.org Contact a health care provider if you have: Unusual bleeding or bruising. Stomach pain or nausea. Ringing in your ears. An allergic reaction that causes hives, itchy skin, or swelling of the lips, tongue, or face. Get help right away if: You notice that your bowel movements are bloody, or dark red or black in color. You vomit or cough up blood. You have blood in your urine. You  cough, breathe loudly (wheeze), or feel short of breath. You have chest pain, especially if the pain spreads to your arms, back, neck, or jaw. You have a headache with confusion. You have any symptoms of a stroke. BE FAST is an easy way to remember the main warning signs of a stroke: B - Balance. Signs are dizziness, sudden trouble walking, or loss of balance. E - Eyes. Signs are trouble seeing or a sudden change in vision. F - Face. Signs are sudden weakness or numbness of the face, or the face or eyelid drooping on one side. A - Arms. Signs are weakness or numbness in an arm. This happens suddenly and usually on one side of the body. S - Speech. Signs are sudden trouble speaking, slurred speech, or trouble understanding what people say. T - Time. Time to call emergency services. Write down what time symptoms started. You have other signs of a stroke, such as: A sudden, severe headache with no known cause. Nausea or vomiting. Seizure. These symptoms may represent a serious problem that is an emergency. Do not wait to see if the symptoms will go away. Get medical help right away. Call your local emergency services (911 in the [place] Do not drive yourself to the hospital. Summary Aspirin  use can help reduce the risk of blood clots, heart attacks, and other heart-related problems. Daily use of aspirin  can cause side effects. Take aspirin  only as told by your health care provider. Make sure that you understand how much to take and what form to take. Your health care provider will help you determine whether it is safe and beneficial for you to take aspirin  daily. This information is not intended to replace advice given to you by your health care provider. Make sure you discuss any questions you have with your health care provider. Document Revised: 02/23/2019 Document Reviewed: 02/23/2019 Elsevier Patient Education  2021 Elsevier Inc. Nitroglycerin  sublingual tablets What is this  medicine? NITROGLYCERIN  (nye troe GLI ser in) is a type of vasodilator. It relaxes blood vessels, increasing the  blood and oxygen supply to your heart. This medicine is used to relieve chest pain caused by angina. It is also used to prevent chest pain before activities like climbing stairs, going outdoors in cold weather, or sexual activity. This medicine may be used for other purposes; ask your health care provider or pharmacist if you have questions. COMMON BRAND NAME(S): Nitroquick, Nitrostat , Nitrotab What should I tell my health care provider before I take this medicine? They need to know if you have any of these conditions: anemia head injury, recent stroke, or bleeding in the brain liver disease previous heart attack an unusual or allergic reaction to nitroglycerin , other medicines, foods, dyes, or preservatives pregnant or trying to get pregnant breast-feeding How should I use this medicine? Take this medicine by mouth as needed. Use at the first sign of an angina attack (chest pain or tightness). You can also take this medicine 5 to 10 minutes before an event likely to produce chest pain. Follow the directions exactly as written on the prescription label. Place one tablet under your tongue and let it dissolve. Do not swallow whole. Replace the dose if you accidentally swallow it. It will help if your mouth is not dry. Saliva around the tablet will help it to dissolve more quickly. Do not eat or drink, smoke or chew tobacco while a tablet is dissolving. Sit down when taking this medicine. In an angina attack, you should feel better within 5 minutes after your first dose. You can take a dose every 5 minutes up to a total of 3 doses. If you do not feel better or feel worse after 1 dose, call 9-1-1 at once. Do not take more than 3 doses in 15 minutes. Your health care provider might give you other directions. Follow those directions if he or she does. Do not take your medicine more often than  directed. Talk to your health care provider about the use of this medicine in children. Special care may be needed. Overdosage: If you think you have taken too much of this medicine contact a poison control center or emergency room at once. NOTE: This medicine is only for you. Do not share this medicine with others. What if I miss a dose? This does not apply. This medicine is only used as needed. What may interact with this medicine? Do not take this medicine with any of the following medications: certain migraine medicines like ergotamine and dihydroergotamine (DHE) medicines used to treat erectile dysfunction like sildenafil, tadalafil, and vardenafil riociguat This medicine may also interact with the following medications: alteplase aspirin  heparin  medicines for high blood pressure medicines for mental depression other medicines used to treat angina phenothiazines like chlorpromazine, mesoridazine, prochlorperazine, thioridazine This list may not describe all possible interactions. Give your health care provider a list of all the medicines, herbs, non-prescription drugs, or dietary supplements you use. Also tell them if you smoke, drink alcohol, or use illegal drugs. Some items may interact with your medicine. What should I watch for while using this medicine? Tell your doctor or health care professional if you feel your medicine is no longer working. Keep this medicine with you at all times. Sit or lie down when you take your medicine to prevent falling if you feel dizzy or faint after using it. Try to remain calm. This will help you to feel better faster. If you feel dizzy, take several deep breaths and lie down with your feet propped up, or bend forward with your head resting between  your knees. You may get drowsy or dizzy. Do not drive, use machinery, or do anything that needs mental alertness until you know how this drug affects you. Do not stand or sit up quickly, especially if you  are an older patient. This reduces the risk of dizzy or fainting spells. Alcohol can make you more drowsy and dizzy. Avoid alcoholic drinks. Do not treat yourself for coughs, colds, or pain while you are taking this medicine without asking your doctor or health care professional for advice. Some ingredients may increase your blood pressure. What side effects may I notice from receiving this medicine? Side effects that you should report to your doctor or health care professional as soon as possible: allergic reactions (skin rash, itching or hives; swelling of the face, lips, or tongue) low blood pressure (dizziness; feeling faint or lightheaded, falls; unusually weak or tired) low red blood cell counts (trouble breathing; feeling faint; lightheaded, falls; unusually weak or tired) Side effects that usually do not require medical attention (report to your doctor or health care professional if they continue or are bothersome): facial flushing (redness) headache nausea, vomiting This list may not describe all possible side effects. Call your doctor for medical advice about side effects. You may report side effects to FDA at 1-800-FDA-1088. Where should I keep my medicine? Keep out of the reach of children. Store at room temperature between 20 and 25 degrees C (68 and 77 degrees F). Store in Retail buyer. Protect from light and moisture. Keep tightly closed. Throw away any unused medicine after the expiration date. NOTE: This sheet is a summary. It may not cover all possible information. If you have questions about this medicine, talk to your doctor, pharmacist, or health care provider.  2021 Elsevier/Gold Standard (2018-02-19 16:46:32)

## 2024-01-08 ENCOUNTER — Telehealth: Payer: Self-pay | Admitting: *Deleted

## 2024-01-08 ENCOUNTER — Ambulatory Visit: Payer: Self-pay | Admitting: Cardiology

## 2024-01-08 LAB — CBC WITH DIFFERENTIAL/PLATELET
Basophils Absolute: 0 x10E3/uL (ref 0.0–0.2)
Basos: 1 %
EOS (ABSOLUTE): 0 x10E3/uL (ref 0.0–0.4)
Eos: 1 %
Hematocrit: 44.6 % (ref 37.5–51.0)
Hemoglobin: 14.5 g/dL (ref 13.0–17.7)
Immature Grans (Abs): 0 x10E3/uL (ref 0.0–0.1)
Immature Granulocytes: 0 %
Lymphocytes Absolute: 2.1 x10E3/uL (ref 0.7–3.1)
Lymphs: 35 %
MCH: 28 pg (ref 26.6–33.0)
MCHC: 32.5 g/dL (ref 31.5–35.7)
MCV: 86 fL (ref 79–97)
Monocytes Absolute: 0.6 x10E3/uL (ref 0.1–0.9)
Monocytes: 11 %
Neutrophils Absolute: 3.2 x10E3/uL (ref 1.4–7.0)
Neutrophils: 52 %
Platelets: 260 x10E3/uL (ref 150–450)
RBC: 5.17 x10E6/uL (ref 4.14–5.80)
RDW: 13.9 % (ref 11.6–15.4)
WBC: 6 x10E3/uL (ref 3.4–10.8)

## 2024-01-08 LAB — BASIC METABOLIC PANEL WITH GFR
BUN/Creatinine Ratio: 7 — ABNORMAL LOW (ref 9–20)
BUN: 8 mg/dL (ref 6–24)
CO2: 24 mmol/L (ref 20–29)
Calcium: 10 mg/dL (ref 8.7–10.2)
Chloride: 106 mmol/L (ref 96–106)
Creatinine, Ser: 1.18 mg/dL (ref 0.76–1.27)
Glucose: 75 mg/dL (ref 70–99)
Potassium: 4.1 mmol/L (ref 3.5–5.2)
Sodium: 146 mmol/L — ABNORMAL HIGH (ref 134–144)
eGFR: 75 mL/min/1.73 (ref >59)

## 2024-01-08 NOTE — Telephone Encounter (Signed)
 Cardiac Catheterization scheduled at [facility] for: Thursday January 09, 2024 10:30 AM Arrival time [facility] Main Entrance A at: 8:30 AM  Diet: -Nothing to eat after midnight prior to procedure.  Hydration: -May drink clear liquids until leaving for hospital. Approved liquids: Water , clear tea, black coffee, fruit juices-non-citric and without pulp,Gatorade, plain Jello/popsicles.  Drink 16 oz. bottle of water  on the way to the hospital.  Medication instructions: -Hold:  Valsartan-hydrochlorothiazide-AM of procedure  -Other usual morning medications can be taken including aspirin  81 mg.  Plan to go home the same day, you will only stay overnight if medically necessary.  You must have responsible adult to drive you home.  Someone must be with you the first 24 hours after you arrive home.  Reviewed procedure instructions with patient.

## 2024-01-09 ENCOUNTER — Other Ambulatory Visit: Payer: Self-pay

## 2024-01-09 ENCOUNTER — Ambulatory Visit (HOSPITAL_COMMUNITY)
Admission: RE | Admit: 2024-01-09 | Discharge: 2024-01-09 | Disposition: A | Discharge status: Home / Self Care | Payer: Self-pay | Attending: Cardiovascular Disease | Admitting: Cardiovascular Disease

## 2024-01-09 ENCOUNTER — Encounter (HOSPITAL_COMMUNITY)
Admission: RE | Disposition: A | Discharge status: Home / Self Care | Payer: Self-pay | Source: Home / Self Care | Attending: Cardiovascular Disease

## 2024-01-09 DIAGNOSIS — Z79899 Other long term (current) drug therapy: Secondary | ICD-10-CM | POA: Insufficient documentation

## 2024-01-09 DIAGNOSIS — R931 Abnormal findings on diagnostic imaging of heart and coronary circulation: Secondary | ICD-10-CM

## 2024-01-09 DIAGNOSIS — Z6841 Body Mass Index (BMI) 40.0 and over, adult: Secondary | ICD-10-CM | POA: Insufficient documentation

## 2024-01-09 DIAGNOSIS — E7800 Pure hypercholesterolemia, unspecified: Secondary | ICD-10-CM | POA: Insufficient documentation

## 2024-01-09 DIAGNOSIS — I25118 Atherosclerotic heart disease of native coronary artery with other forms of angina pectoris: Secondary | ICD-10-CM | POA: Insufficient documentation

## 2024-01-09 DIAGNOSIS — R0609 Other forms of dyspnea: Secondary | ICD-10-CM

## 2024-01-09 DIAGNOSIS — I10 Essential (primary) hypertension: Secondary | ICD-10-CM | POA: Insufficient documentation

## 2024-01-09 HISTORY — PX: LEFT HEART CATH AND CORONARY ANGIOGRAPHY: CATH118249

## 2024-01-09 LAB — NO BLOOD PRODUCTS

## 2024-01-09 LAB — GLUCOSE, CAPILLARY: Glucose-Capillary: 106 mg/dL — ABNORMAL HIGH (ref 70–99)

## 2024-01-09 SURGERY — LEFT HEART CATH AND CORONARY ANGIOGRAPHY
Anesthesia: LOCAL

## 2024-01-09 MED ORDER — SODIUM CHLORIDE 0.9% FLUSH: 3.0000 mL | INTRAVENOUS | Status: DC | PRN

## 2024-01-09 MED ORDER — IOHEXOL 350 MG/ML SOLN
INTRAVENOUS | Status: DC | PRN
  Administered 2024-01-09: 60 mL via INTRA_ARTERIAL

## 2024-01-09 MED ORDER — FENTANYL CITRATE (PF) 100 MCG/2ML IJ SOLN
INTRAMUSCULAR | Status: DC | PRN
  Administered 2024-01-09: 50 ug via INTRAVENOUS

## 2024-01-09 MED ORDER — HEPARIN (PORCINE) IN NACL 2-0.9 UNITS/ML
INTRAMUSCULAR | Status: DC | PRN
  Administered 2024-01-09: 10 mL via INTRA_ARTERIAL

## 2024-01-09 MED ORDER — LIDOCAINE HCL (PF) 1 % IJ SOLN
INTRAMUSCULAR | Status: AC
  Filled 2024-01-09: qty 30

## 2024-01-09 MED ORDER — ACETAMINOPHEN 325 MG PO TABS: 650.0000 mg | ORAL_TABLET | ORAL | Status: DC | PRN

## 2024-01-09 MED ORDER — SODIUM CHLORIDE 0.9% FLUSH
3.0000 mL | INTRAVENOUS | Status: DC | PRN
Start: 2024-01-09 — End: 2024-01-09

## 2024-01-09 MED ORDER — ASPIRIN 81 MG PO CHEW: 81.0000 mg | CHEWABLE_TABLET | ORAL | Status: DC

## 2024-01-09 MED ORDER — HEPARIN SODIUM (PORCINE) 1000 UNIT/ML IJ SOLN
INTRAMUSCULAR | Status: AC
  Filled 2024-01-09: qty 10

## 2024-01-09 MED ORDER — HEPARIN (PORCINE) IN NACL 1000-0.9 UT/500ML-% IV SOLN
INTRAVENOUS | Status: DC | PRN
  Administered 2024-01-09 (×2): 500 mL via INTRA_ARTERIAL

## 2024-01-09 MED ORDER — ONDANSETRON HCL 4 MG/2ML IJ SOLN
4.0000 mg | Freq: Four times a day (QID) | INTRAMUSCULAR | Status: DC | PRN
Start: 2024-01-09 — End: 2024-01-09

## 2024-01-09 MED ORDER — LIDOCAINE HCL (PF) 1 % IJ SOLN
INTRAMUSCULAR | Status: DC | PRN
  Administered 2024-01-09: 5 mL

## 2024-01-09 MED ORDER — HEPARIN SODIUM (PORCINE) 1000 UNIT/ML IJ SOLN
INTRAMUSCULAR | Status: DC | PRN
  Administered 2024-01-09: 6000 [IU] via INTRAVENOUS

## 2024-01-09 MED ORDER — SODIUM CHLORIDE 0.9 % IV SOLN: 250.0000 mL | INTRAVENOUS | Status: DC | PRN

## 2024-01-09 MED ORDER — FREE WATER: 500.0000 mL | Freq: Once | Status: DC

## 2024-01-09 MED ORDER — MIDAZOLAM HCL 2 MG/2ML IJ SOLN
INTRAMUSCULAR | Status: DC | PRN
  Administered 2024-01-09 (×2): 1 mg via INTRAVENOUS

## 2024-01-09 MED ORDER — SODIUM CHLORIDE 0.9% FLUSH: 3.0000 mL | Freq: Two times a day (BID) | INTRAVENOUS | Status: DC

## 2024-01-09 MED ORDER — VERAPAMIL HCL 2.5 MG/ML IV SOLN
INTRAVENOUS | Status: AC
  Filled 2024-01-09: qty 2

## 2024-01-09 MED ORDER — FENTANYL CITRATE (PF) 100 MCG/2ML IJ SOLN
INTRAMUSCULAR | Status: AC
  Filled 2024-01-09: qty 2

## 2024-01-09 MED ORDER — MIDAZOLAM HCL 2 MG/2ML IJ SOLN
INTRAMUSCULAR | Status: AC
  Filled 2024-01-09: qty 2

## 2024-01-09 MED ORDER — SODIUM CHLORIDE 0.9 % IV SOLN
250.0000 mL | INTRAVENOUS | Status: DC | PRN
Start: 2024-01-09 — End: 2024-01-09

## 2024-01-09 SURGICAL SUPPLY — 7 items
CATH INFINITI AMBI 5FR JK (CATHETERS) IMPLANT
DEVICE RAD COMP TR BAND LRG (VASCULAR PRODUCTS) IMPLANT
GLIDESHEATH SLEND SS 6F .021 (SHEATH) IMPLANT
GUIDEWIRE INQWIRE 1.5J.035X260 (WIRE) IMPLANT
KIT SINGLE USE MANIFOLD (KITS) IMPLANT
PACK CARDIAC CATHETERIZATION (CUSTOM PROCEDURE TRAY) ×1 IMPLANT
SET ATX-X65L (MISCELLANEOUS) IMPLANT

## 2024-01-09 NOTE — Discharge Instructions (Signed)

## 2024-01-09 NOTE — Interval H&P Note (Signed)
 History and Physical Interval Note:  01/09/2024 9:39 AM  Andre Decker  has presented today for surgery, with the diagnosis of abnormal ct.  The various methods of treatment have been discussed with the patient and family. After consideration of risks, benefits and other options for treatment, the patient has consented to  Procedure(s): LEFT HEART CATH AND CORONARY ANGIOGRAPHY (N/A) as a surgical intervention.  The patient's history has been reviewed, patient examined, no change in status, stable for surgery.  I have reviewed the patient's chart and labs.  Questions were answered to the patient's satisfaction.     Bastian Andreoli

## 2024-01-10 ENCOUNTER — Encounter (HOSPITAL_COMMUNITY): Payer: Self-pay | Admitting: Cardiovascular Disease

## 2024-01-24 ENCOUNTER — Ambulatory Visit (HOSPITAL_COMMUNITY)
Admission: RE | Admit: 2024-01-24 | Discharge: 2024-01-24 | Disposition: A | Discharge status: Home / Self Care | Payer: Self-pay | Source: Ambulatory Visit | Attending: Cardiology | Admitting: Cardiology

## 2024-01-24 DIAGNOSIS — R0609 Other forms of dyspnea: Secondary | ICD-10-CM

## 2024-01-24 DIAGNOSIS — R011 Cardiac murmur, unspecified: Secondary | ICD-10-CM

## 2024-01-24 LAB — ECHOCARDIOGRAM COMPLETE
Area-P 1/2: 4.36 cm2
S' Lateral: 3.83 cm

## 2024-01-28 ENCOUNTER — Encounter: Payer: Self-pay | Admitting: Cardiology

## 2024-01-28 ENCOUNTER — Ambulatory Visit: Payer: Self-pay | Attending: Cardiology | Admitting: Cardiology

## 2024-01-28 VITALS — BP 148/100 | HR 62 | Ht 73.0 in | Wt 286.6 lb

## 2024-01-28 DIAGNOSIS — I10 Essential (primary) hypertension: Secondary | ICD-10-CM

## 2024-01-28 DIAGNOSIS — E7800 Pure hypercholesterolemia, unspecified: Secondary | ICD-10-CM

## 2024-01-28 DIAGNOSIS — G4733 Obstructive sleep apnea (adult) (pediatric): Secondary | ICD-10-CM

## 2024-01-28 DIAGNOSIS — I2510 Atherosclerotic heart disease of native coronary artery without angina pectoris: Secondary | ICD-10-CM

## 2024-01-28 NOTE — Patient Instructions (Signed)
 Medication Instructions:  Your physician recommends that you continue on your current medications as directed. Please refer to the Current Medication list given to you today.  *If you need a refill on your cardiac medications before your next appointment, please call your pharmacy*   Lab Work: Your physician recommends that you return for lab work in: 6 weeks for CMP and lipids. You need to have labs done when you are fasting. [facility] lab is located on the 3rd floor, [address]. Hours are Monday - Friday 8 am to 4 pm, closed 11:30 am to 1:00 pm. You do NOT need an appointment.   If you have labs (blood work) drawn today and your tests are completely normal, you will receive your results only by: MyChart Message (if you have MyChart) OR A paper copy in the mail If you have any lab test that is abnormal or we need to change your treatment, we will call you to review the results.   Testing/Procedures: None ordered   Follow-Up: At [facility], you and your health needs are our priority.  As part of our continuing mission to provide you with exceptional heart care, we have created designated Provider Care Teams.  These Care Teams include your primary Cardiologist (physician) and Advanced Practice Providers (APPs -  Physician Assistants and Nurse Practitioners) who all work together to provide you with the care you need, when you need it.  We recommend signing up for the patient portal called MyChart.  Sign up information is provided on this After Visit Summary.  MyChart is used to connect with patients for Virtual Visits (Telemedicine).  Patients are able to view lab/test results, encounter notes, upcoming appointments, etc.  Non-urgent messages can be sent to your provider as well.   To learn more about what you can do with MyChart, go to [email or web address].    Your next appointment:   9 month(s)  The format for your next appointment:   In Person  Provider:   Jennifer Crape, MD    Other Instructions none  Important Information About Sugar

## 2024-01-28 NOTE — Progress Notes (Signed)
 Cardiology Office Note:    Date:  01/28/2024   ID:  Andre Decker, DOB 01-Mar-1971, MRN [id number]  PCP:  Verena Mems, MD  Cardiologist:  Jennifer JONELLE Crape, MD   Referring MD: Verena Mems, MD    ASSESSMENT:    1. Coronary artery disease involving native coronary artery of native heart without angina pectoris   2. HYPERTENSION   3. Obstructive sleep apnea syndrome   4. Pure hypercholesterolemia    PLAN:    In order of problems listed above:  Coronary artery disease: Nonobstructive in nature: Secondary prevention stressed with the patient.  Importance of compliance with diet medication stressed and patient verbalized standing.  I discussed findings of the coronary angiography with the patient extensively and and questions were answered to satisfaction.  He was advised to walk at least half an hour a day on a daily basis. Next dyslipidemia: On lipid-lowering medications.  He will have blood work.  Goal LDL less than 60. Essential hypertension: Blood pressure stable and diet was emphasized.  Lifestyle modification urged.  Salt intake issues were discussed. Obesity: Weight reduction stressed, diet emphasized. Patient will be seen in follow-up appointment in 6 months or earlier if the patient has any concerns.    Medication Adjustments/Labs and Tests Ordered: Current medicines are reviewed at length with the patient today.  Concerns regarding medicines are outlined above.  No orders of the defined types were placed in this encounter.  No orders of the defined types were placed in this encounter.    No chief complaint on file.    History of Present Illness:    Andre Decker is a 53 y.o. male.  Patient has past medical history of coronary artery disease nonobstructive in nature diagnosed recently by coronary angiography, essential hypertension, mixed dyslipidemia and obesity.  He denies any problems at this time and takes care of activities of daily living.  No  chest pain orthopnea or PND.  At the time of my evaluation, the patient is alert awake oriented and in no distress.  Past Medical History:  Diagnosis Date   1st degree AV block    Abdominal pain 12/19/2015   Abnormal LFTs (liver function tests) 12/19/2015   Allergic rhinitis 11/09/2020   Biliary colic    Biliary dyskinesia 11/09/2020   Bradycardia 09/26/2016   Cataract    Coronary artery disease 01/07/2024   DOE (dyspnea on exertion) 12/17/2023   Erectile dysfunction 11/09/2020   Fatty liver 06/25/2018   Glaucoma of both eyes    H/O vitamin D deficiency 06/25/2018   HEMANGIOMA, HEPATIC 09/06/2007   Qualifier: Diagnosis of   By: Myriam RN, Maudie         HYPERTENSION 09/06/2007   Qualifier: Diagnosis of   By: Myriam RN, Carissa      Replacing diagnoses that were inactivated after the 09/03/22 regulatory import     Impaired glucose metabolism 06/25/2018   IRRITABLE BOWEL SYNDROME 05/23/2010   Qualifier: Diagnosis of   By: Jakie MD NOLIA Lenis R        IT band syndrome    Liver hemangioma    dx 2002   No advance directive on file 01/03/2016   Non-insertional Achilles tendinopathy 10/15/2019   OBESITY, UNSPECIFIED 03/15/2009   Qualifier: Diagnosis of   By: Jakie MD NOLIA Lenis R        Obstructive sleep apnea syndrome 11/09/2020   OSA on CPAP    moderate osa per study 05-21-2004   Osteoarthritis  Pain of left heel 11/03/2019   Pancreas divisum 12/19/2015   Paresthesia of left foot 04/20/2015   Prediabetes 11/09/2020   Pure hypercholesterolemia 11/09/2020   S/P cholecystectomy 03/12/2017   Sciatica 11/09/2020   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   Wears contact lenses     Past Surgical History:  Procedure Laterality Date   ACHILLES TENDON REPAIR Left 02/2020   CATARACT EXTRACTION W/ INTRAOCULAR LENS IMPLANT Left 01/18/2012   CHOLECYSTECTOMY N/A 11/01/2016   Procedure: LAPAROSCOPIC CHOLECYSTECTOMY;  Surgeon: Stevie Herlene Righter, MD;  Location: [facility];  Service:  General;  Laterality: N/A;  WILL NEED ANESTHESIA CONSULT PRIOR TO SURGERY - SEE NOTE IN SPECIAL NEEDS SECTION   COLONOSCOPY  2014   2nd colonoscopy 08/2019   COLONOSCOPY WITH ESOPHAGOGASTRODUODENOSCOPY (EGD)  last one 11-24-2012   LEFT HEART CATH AND CORONARY ANGIOGRAPHY N/A 01/09/2024   Procedure: LEFT HEART CATH AND CORONARY ANGIOGRAPHY;  Surgeon: Darron Deatrice LABOR, MD;  Location: [facility];  Service: Cardiovascular;  Laterality: N/A;   RETINAL DETACHMENT REPAIR W/ SCLERAL BUCKLE LE Bilateral 09/23/2009   Left eye and Laser for Retinal breaks Right eye   UPPER ESOPHAGEAL ENDOSCOPIC ULTRASOUND (EUS)  2017    [facility]   WRIST FRACTURE SURGERY Right     Current Medications: Current Meds  Medication Sig   aspirin  EC 81 MG tablet Take 1 tablet (81 mg total) by mouth daily. Swallow whole.   bimatoprost (LUMIGAN) 0.01 % SOLN Place 1 drop into both eyes at bedtime.    brimonidine-timolol (COMBIGAN) 0.2-0.5 % ophthalmic solution Place 1 drop into both eyes 2 (two) times daily.    brinzolamide (AZOPT) 1 % ophthalmic suspension Place 1 drop into the right eye 2 (two) times daily.   hydrALAZINE (APRESOLINE) 25 MG tablet Take 25 mg by mouth 3 (three) times daily.   nitroGLYCERIN  (NITROSTAT ) 0.4 MG SL tablet Place 1 tablet (0.4 mg total) under the tongue every 5 (five) minutes as needed.   prednisoLONE acetate (PRED FORTE) 1 % ophthalmic suspension Place 1 drop into both eyes daily.   RHOPRESSA 0.02 % SOLN Place 1 drop into both eyes daily.   rosuvastatin  (CRESTOR ) 20 MG tablet Take 1 tablet (20 mg total) by mouth daily.   valsartan-hydrochlorothiazide (DIOVAN-HCT) 320-12.5 MG tablet Take 1 tablet by mouth daily.     Allergies:   Amlodipine and Blood-group specific substance   Social History   Socioeconomic History   Marital status: Married    Spouse name: Newell   Number of children: 1   Years of education: 12   Highest education level: Not on file  Occupational History   Occupation:  TRANSPORTATION    Employer: [employer]  Tobacco Use   Smoking status: Never   Smokeless tobacco: Never  Vaping Use   Vaping status: Never Used  Substance and Sexual Activity   Alcohol use: No    Alcohol/week: 0.0 standard drinks of alcohol   Drug use: No   Sexual activity: Yes    Partners: Female  Other Topics Concern   Not on file  Social History Narrative   Lives with wife, Newell   Patient does not drink caffeine.   Patient is left handed.    Social Drivers of Corporate investment banker Strain: Not on file  Food Insecurity: Not on file  Transportation Needs: Not on file  Physical Activity: Not on file  Stress: Not on file  Social Connections: Not on file     Family History: The patient's family  history includes Arthritis/Rheumatoid in his maternal grandmother and paternal grandmother; Cancer in his maternal grandmother; Migraines in his mother; Prostate cancer in his maternal grandfather and maternal uncle; Stroke in his paternal grandfather. There is no history of Stomach cancer, Colon cancer, Colon polyps, Rectal cancer, or Esophageal cancer.  ROS:   Please see the history of present illness.    All other systems reviewed and are negative.  EKGs/Labs/Other Studies Reviewed:    The following studies were reviewed today: .SABRA   I discussed my findings with the patient at length   Recent Labs: 11/28/2023: ALT 30; Magnesium  1.6; TSH 3.387 01/07/2024: BUN 8; Creatinine, Ser 1.18; Hemoglobin 14.5; Platelets 260; Potassium 4.1; Sodium 146  Recent Lipid Panel No results found for: CHOL, TRIG, HDL, CHOLHDL, VLDL, LDLCALC, LDLDIRECT  Physical Exam:    VS:  BP (!) 148/100   Pulse 62   Ht 6' 1 (1.854 m)   Wt 286 lb 9.6 oz (130 kg)   SpO2 97%   BMI 37.81 kg/m     Wt Readings from Last 3 Encounters:  01/28/24 286 lb 9.6 oz (130 kg)  01/09/24 287 lb (130.2 kg)  01/07/24 287 lb (130.2 kg)     GEN: Patient is in no acute distress HEENT:  Normal NECK: No JVD; No carotid bruits LYMPHATICS: No lymphadenopathy CARDIAC: Hear sounds regular, 2/6 systolic murmur at the apex. RESPIRATORY:  Clear to auscultation without rales, wheezing or rhonchi  ABDOMEN: Soft, non-tender, non-distended MUSCULOSKELETAL:  No edema; No deformity  SKIN: Warm and dry NEUROLOGIC:  Alert and oriented x 3 PSYCHIATRIC:  Normal affect   Signed, Jennifer JONELLE Crape, MD  01/28/2024 10:47 AM    [facility]

## 2024-02-19 ENCOUNTER — Ambulatory Visit: Payer: Self-pay | Admitting: Cardiology

## 2024-02-27 ENCOUNTER — Other Ambulatory Visit: Payer: Self-pay

## 2024-02-27 ENCOUNTER — Other Ambulatory Visit (HOSPITAL_COMMUNITY): Payer: Self-pay

## 2024-02-27 MED ORDER — DORZOLAMIDE HCL 2 % OP SOLN
1.0000 [drp] | Freq: Two times a day (BID) | OPHTHALMIC | 8 refills | Status: AC
  Filled 2024-02-27: qty 10, 100d supply, fill #0

## 2024-02-27 MED ORDER — BRIMONIDINE TARTRATE-TIMOLOL 0.2-0.5 % OP SOLN
1.0000 [drp] | Freq: Two times a day (BID) | OPHTHALMIC | 2 refills | Status: AC
  Filled 2024-02-27: qty 5, 50d supply, fill #0
  Filled 2024-04-08: qty 5, 50d supply, fill #1

## 2024-03-02 ENCOUNTER — Other Ambulatory Visit (HOSPITAL_COMMUNITY): Payer: Self-pay

## 2024-03-05 ENCOUNTER — Ambulatory Visit: Payer: Self-pay | Admitting: Cardiology

## 2024-03-05 LAB — COMPREHENSIVE METABOLIC PANEL WITH GFR
ALT: 54 IU/L — ABNORMAL HIGH (ref 0–44)
AST: 34 IU/L (ref 0–40)
Albumin: 5 g/dL — ABNORMAL HIGH (ref 3.8–4.9)
Alkaline Phosphatase: 54 IU/L (ref 47–123)
BUN/Creatinine Ratio: 8 — ABNORMAL LOW (ref 9–20)
BUN: 9 mg/dL (ref 6–24)
Bilirubin Total: 1.3 mg/dL — ABNORMAL HIGH (ref 0.0–1.2)
CO2: 24 mmol/L (ref 20–29)
Calcium: 10 mg/dL (ref 8.7–10.2)
Chloride: 105 mmol/L (ref 96–106)
Creatinine, Ser: 1.17 mg/dL (ref 0.76–1.27)
Globulin, Total: 2.3 g/dL (ref 1.5–4.5)
Glucose: 91 mg/dL (ref 70–99)
Potassium: 4.4 mmol/L (ref 3.5–5.2)
Sodium: 144 mmol/L (ref 134–144)
Total Protein: 7.3 g/dL (ref 6.0–8.5)
eGFR: 75 mL/min/1.73 (ref >59)

## 2024-03-05 LAB — LIPID PANEL
Chol/HDL Ratio: 2.8 ratio (ref 0.0–5.0)
Cholesterol, Total: 124 mg/dL (ref 100–199)
HDL: 44 mg/dL (ref >39)
LDL Chol Calc (NIH): 65 mg/dL (ref 0–99)
Triglycerides: 75 mg/dL (ref 0–149)
VLDL Cholesterol Cal: 15 mg/dL (ref 5–40)

## 2024-03-18 NOTE — Progress Notes (Signed)
 Blood pressure elevated on 2nd reading per Modified Sprint Protocol.   PCP Katheen, Reena LABOR, MD) was provided to the specialist provider via Red Card.   Blood pressure was taken on bare arm.  Per patient, blood pressure medications have been taken as prescribed.

## 2024-04-08 ENCOUNTER — Other Ambulatory Visit (HOSPITAL_COMMUNITY): Payer: Self-pay

## 2024-04-09 ENCOUNTER — Other Ambulatory Visit (HOSPITAL_COMMUNITY): Payer: Self-pay

## 2024-04-09 MED ORDER — ROSUVASTATIN CALCIUM 20 MG PO TABS
20.0000 mg | ORAL_TABLET | Freq: Every day | ORAL | 3 refills | Status: AC
  Filled 2024-04-09: qty 90, 90d supply, fill #0
  Filled 2024-06-22: qty 90, 90d supply, fill #1

## 2024-04-10 ENCOUNTER — Other Ambulatory Visit (HOSPITAL_COMMUNITY): Payer: Self-pay

## 2024-05-22 ENCOUNTER — Other Ambulatory Visit: Payer: Self-pay

## 2024-05-22 ENCOUNTER — Other Ambulatory Visit (HOSPITAL_COMMUNITY): Payer: Self-pay

## 2024-05-22 MED ORDER — DORZOLAMIDE HCL 2 % OP SOLN
1.0000 [drp] | Freq: Two times a day (BID) | OPHTHALMIC | 3 refills | Status: AC
  Filled 2024-05-22 (×2): qty 10, 100d supply, fill #0
  Filled 2024-06-15: qty 10, 90d supply, fill #1

## 2024-05-22 MED ORDER — RHOPRESSA 0.02 % OP SOLN
1.0000 [drp] | Freq: Every evening | OPHTHALMIC | 3 refills | Status: AC
  Filled 2024-05-22: qty 2.5, 50d supply, fill #0

## 2024-05-22 MED ORDER — BRIMONIDINE TARTRATE-TIMOLOL 0.2-0.5 % OP SOLN
1.0000 [drp] | Freq: Two times a day (BID) | OPHTHALMIC | 3 refills | Status: AC
  Filled 2024-05-22: qty 5, 50d supply, fill #0
  Filled 2024-05-22: qty 5, 25d supply, fill #0
  Filled 2024-06-15: qty 5, 50d supply, fill #1

## 2024-05-22 MED ORDER — LUMIGAN 0.01 % OP SOLN
1.0000 [drp] | Freq: Every evening | OPHTHALMIC | 3 refills | Status: AC
  Filled 2024-05-22 – 2024-06-16 (×4): qty 2.5, 50d supply, fill #0
  Filled 2024-06-16 – 2024-06-18 (×2): qty 5, 90d supply, fill #0

## 2024-05-25 ENCOUNTER — Other Ambulatory Visit (HOSPITAL_COMMUNITY): Payer: Self-pay

## 2024-05-26 ENCOUNTER — Emergency Department (HOSPITAL_COMMUNITY)
Admission: EM | Admit: 2024-05-26 | Discharge: 2024-05-27 | Disposition: A | Discharge status: Home / Self Care | Payer: Self-pay | Attending: Emergency Medicine | Admitting: Emergency Medicine

## 2024-05-26 ENCOUNTER — Encounter (HOSPITAL_COMMUNITY): Payer: Self-pay | Admitting: Emergency Medicine

## 2024-05-26 ENCOUNTER — Emergency Department (HOSPITAL_COMMUNITY): Payer: Self-pay

## 2024-05-26 ENCOUNTER — Other Ambulatory Visit: Payer: Self-pay

## 2024-05-26 DIAGNOSIS — I10 Essential (primary) hypertension: Secondary | ICD-10-CM | POA: Insufficient documentation

## 2024-05-26 DIAGNOSIS — Z79899 Other long term (current) drug therapy: Secondary | ICD-10-CM | POA: Insufficient documentation

## 2024-05-26 DIAGNOSIS — R739 Hyperglycemia, unspecified: Secondary | ICD-10-CM | POA: Insufficient documentation

## 2024-05-26 DIAGNOSIS — J101 Influenza due to other identified influenza virus with other respiratory manifestations: Secondary | ICD-10-CM | POA: Insufficient documentation

## 2024-05-26 DIAGNOSIS — I2510 Atherosclerotic heart disease of native coronary artery without angina pectoris: Secondary | ICD-10-CM | POA: Insufficient documentation

## 2024-05-26 DIAGNOSIS — Z7982 Long term (current) use of aspirin: Secondary | ICD-10-CM | POA: Insufficient documentation

## 2024-05-26 LAB — BASIC METABOLIC PANEL WITH GFR
Anion gap: 13 (ref 5–15)
BUN: 12 mg/dL (ref 6–20)
CO2: 19 mmol/L — ABNORMAL LOW (ref 22–32)
Calcium: 10 mg/dL (ref 8.9–10.3)
Chloride: 106 mmol/L (ref 98–111)
Creatinine, Ser: 1.35 mg/dL — ABNORMAL HIGH (ref 0.61–1.24)
GFR, Estimated: >60 mL/min
Glucose, Bld: 218 mg/dL — ABNORMAL HIGH (ref 70–99)
Potassium: 4.4 mmol/L (ref 3.5–5.1)
Sodium: 139 mmol/L (ref 135–145)

## 2024-05-26 LAB — CBC
HCT: 43.3 % (ref 39.0–52.0)
Hemoglobin: 14.2 g/dL (ref 13.0–17.0)
MCH: 27.5 pg (ref 26.0–34.0)
MCHC: 32.8 g/dL (ref 30.0–36.0)
MCV: 83.9 fL (ref 80.0–100.0)
Platelets: 197 K/uL (ref 150–400)
RBC: 5.16 MIL/uL (ref 4.22–5.81)
RDW: 13.1 % (ref 11.5–15.5)
WBC: 7.7 K/uL (ref 4.0–10.5)
nRBC: 0 % (ref 0.0–0.2)

## 2024-05-26 NOTE — ED Triage Notes (Signed)
 Quick triage note: Pt C/O body aches , cough, sore throat, emesis , fever that started today. Has not taken OTC medications.

## 2024-05-27 LAB — RESP PANEL BY RT-PCR (RSV, FLU A&B, COVID)  RVPGX2
Influenza A by PCR: POSITIVE — AB
Influenza B by PCR: NEGATIVE
Resp Syncytial Virus by PCR: NEGATIVE
SARS Coronavirus 2 by RT PCR: NEGATIVE

## 2024-05-27 MED ORDER — SODIUM CHLORIDE 0.9 % IV BOLUS
1000.0000 mL | Freq: Once | INTRAVENOUS | Status: AC
  Administered 2024-05-27: 1000 mL via INTRAVENOUS

## 2024-05-27 MED ORDER — ACETAMINOPHEN 325 MG PO TABS
650.0000 mg | ORAL_TABLET | Freq: Once | ORAL | Status: AC
  Administered 2024-05-27: 650 mg via ORAL
  Filled 2024-05-27: qty 2

## 2024-05-27 NOTE — ED Provider Notes (Signed)
 "  EMERGENCY DEPARTMENT AT [facility] Provider Note   CSN: [id number] Arrival date & time: 05/26/24  2146     Patient presents with: flu sx   Andre Decker is a 53 y.o. male.   53 year old male presents with fever, chills, cough, vomiting, diarrhea, abdominal pain. Onset yesterday evening, no known sick contacts. Has not had a flu shot this season (does not like needles).        Prior to Admission medications  Medication Sig Start Date End Date Taking? Authorizing Provider  aspirin  EC 81 MG tablet Take 1 tablet (81 mg total) by mouth daily. Swallow whole. 01/07/24   Decker, Jennifer SAUNDERS, MD  bimatoprost  (LUMIGAN ) 0.01 % SOLN Place 1 drop into both eyes at bedtime.     [provider]  bimatoprost  (LUMIGAN ) 0.01 % SOLN Place 1 drop into the right eye every evening. 05/22/24     brimonidine -timolol  (COMBIGAN ) 0.2-0.5 % ophthalmic solution Place 1 drop into both eyes 2 (two) times daily.     [provider]  brimonidine -timolol  (COMBIGAN ) 0.2-0.5 % ophthalmic solution Place 1 drop into both eyes 2 (two) times daily. 09/24/23   Andre Bruckner, MD  brimonidine -timolol  (COMBIGAN ) 0.2-0.5 % ophthalmic solution Place 1 drop into both eyes 2 (two) times daily. 05/22/24     brinzolamide (AZOPT) 1 % ophthalmic suspension Place 1 drop into the right eye 2 (two) times daily.    [provider]  dorzolamide  (TRUSOPT ) 2 % ophthalmic solution Place 1 drop into the right eye 2 (two) times daily. 01/13/24   Andre Bruckner, MD  dorzolamide  (TRUSOPT ) 2 % ophthalmic solution Place 1 drop into the right eye 2 (two) times daily. 05/22/24     hydrALAZINE (APRESOLINE) 25 MG tablet Take 25 mg by mouth 3 (three) times daily.    [provider]  Netarsudil  Dimesylate (RHOPRESSA ) 0.02 % SOLN Place 1 drop into the right eye every evening. 05/22/24     nitroGLYCERIN  (NITROSTAT ) 0.4 MG SL tablet Place 1 tablet (0.4 mg total) under the tongue every 5  (five) minutes as needed. 01/07/24 04/06/24  Decker, Andre R, MD  prednisoLONE acetate (PRED FORTE) 1 % ophthalmic suspension Place 1 drop into both eyes daily.    [provider]  RHOPRESSA  0.02 % SOLN Place 1 drop into both eyes daily. 06/18/18   [provider]  rosuvastatin  (CRESTOR ) 20 MG tablet Take 1 tablet (20 mg total) by mouth daily. 01/07/24 04/06/24  Decker, Jennifer SAUNDERS, MD  rosuvastatin  (CRESTOR ) 20 MG tablet Take 1 tablet (20 mg total) by mouth daily. 01/07/24   Decker, Jennifer SAUNDERS, MD  valsartan-hydrochlorothiazide (DIOVAN-HCT) 320-12.5 MG tablet Take 1 tablet by mouth daily.    [provider]    Allergies: Amlodipine and Blood-group specific substance    Review of Systems Negative except as per HPI Updated Vital Signs BP (!) 155/102 (BP Location: Left Arm)   Pulse (!) 111   Temp 99.3 F (37.4 C) (Oral)   Resp 19   Ht 6' 1 (1.854 m)   Wt 131 kg   SpO2 93%   BMI 38.10 kg/m   Physical Exam Vitals and nursing note reviewed.  Constitutional:      General: He is not in acute distress.    Appearance: He is well-developed. He is not diaphoretic.  HENT:     Head: Normocephalic and atraumatic.  Eyes:     Conjunctiva/sclera: Conjunctivae normal.  Cardiovascular:     Rate and  Rhythm: Regular rhythm. Tachycardia present.     Heart sounds: Normal heart sounds.  Pulmonary:     Effort: Pulmonary effort is normal.     Breath sounds: Normal breath sounds.  Musculoskeletal:     Cervical back: Neck supple.     Right lower leg: No edema.     Left lower leg: No edema.  Skin:    General: Skin is warm and dry.     Findings: No erythema or rash.  Neurological:     Mental Status: He is alert and oriented to person, place, and time.  Psychiatric:        Behavior: Behavior normal.     (all labs ordered are listed, but only abnormal results are displayed) Labs Reviewed  RESP PANEL BY RT-PCR (RSV, FLU A&B, COVID)  RVPGX2 - Abnormal; Notable for the following  components:      Result Value   Influenza A by PCR POSITIVE (*)    All other components within normal limits  BASIC METABOLIC PANEL WITH GFR - Abnormal; Notable for the following components:   CO2 19 (*)    Glucose, Bld 218 (*)    Creatinine, Ser 1.35 (*)    All other components within normal limits  CBC  CBG MONITORING, ED    EKG: EKG Interpretation Date/Time:  Tuesday May 26 2024 22:58:31 EST Ventricular Rate:  101 PR Interval:  178 QRS Duration:  92 QT Interval:  308 QTC Calculation: 399 R Axis:   69  Text Interpretation: Sinus tachycardia Cannot rule out Inferior infarct , age undetermined T wave abnormality, consider lateral ischemia Abnormal ECG When compared with ECG of 07-Jan-2024 15:50, No significant change was found Confirmed by Raford Lenis ([id number]) on 05/26/2024 11:51:22 PM  Radiology: [facility] Chest 2 View Result Date: 05/26/2024 CLINICAL DATA:  Shortness of breath EXAM: CHEST - 2 VIEW COMPARISON:  11/28/2023 FINDINGS: The heart size and mediastinal contours are within normal limits. Both lungs are clear. The visualized skeletal structures are unremarkable. IMPRESSION: No active cardiopulmonary disease. Electronically Signed   By: Luke Bun M.D.   On: 05/26/2024 23:03     Procedures   Medications Ordered in the ED  acetaminophen  (TYLENOL ) tablet 650 mg (650 mg Oral Given 05/27/24 0523)  sodium chloride  0.9 % bolus 1,000 mL (0 mLs Intravenous Stopped 05/27/24 9380)                                    Medical Decision Making Amount and/or Complexity of Data Reviewed Labs: ordered. Radiology: ordered.  Risk OTC drugs.   This patient presents to the ED for concern of fever, cough, bodyaches, this involves an extensive number of treatment options, and is a complaint that carries with it a high risk of complications and morbidity.  The differential diagnosis includes viral illness, gastroenteritis, pna, colitis, electrolyte/metabolic    Co morbidities /  Chronic conditions that complicate the patient evaluation  HTN, prediabetes, CAD, HLD   Additional history obtained:  Additional history obtained from EMR External records from outside source obtained and reviewed including prior labs and imaging on file   Lab Tests:  I Ordered, and personally interpreted labs.  The pertinent results include:  CBC WNL. BMP with CO2 19, glucose elevated at 218, gap normal. CR slightly elevated at 1.35. Flu A positive, negative for COVID, flu b, RSV   Imaging Studies ordered:  I ordered imaging studies including CXR  I independently visualized and interpreted imaging which showed no acute process I agree with the radiologist interpretation   Cardiac Monitoring: / EKG:  The patient was maintained on a cardiac monitor.  I personally viewed and interpreted the cardiac monitored which showed an underlying rhythm of: Sinus tachycardia, rate 101   Problem List / ED Course / Critical interventions / Medication management  53 year old male presents with complaint as above.  On exam, appears to feel unwell although nontoxic and in no distress.  Positive for flu.  Blood sugar is elevated, history of prediabetes.  Provided with IV fluids and Tylenol . I ordered medication including Tylenol , IV fluid Reevaluation of the patient after these medicines showed that the patient stable I have reviewed the patients home medicines and have made adjustments as needed   Social Determinants of Health:  Has PCP   Test / Admission - Considered:  Stable for discharge      Final diagnoses:  Influenza A  Hyperglycemia    ED Discharge Orders     None          Beverley Leita LABOR, PA-C 05/27/24 [id number]  "

## 2024-05-27 NOTE — Discharge Instructions (Addendum)
 Your blood sugar is elevated today.  Please hydrate with low sugar fluids.  Take Motrin  Tylenol  as instructed.

## 2024-06-15 ENCOUNTER — Other Ambulatory Visit: Payer: Self-pay

## 2024-06-15 ENCOUNTER — Other Ambulatory Visit (HOSPITAL_COMMUNITY): Payer: Self-pay

## 2024-06-16 ENCOUNTER — Other Ambulatory Visit (HOSPITAL_COMMUNITY): Payer: Self-pay

## 2024-06-18 ENCOUNTER — Encounter: Payer: Self-pay | Admitting: Internal Medicine

## 2024-06-18 ENCOUNTER — Other Ambulatory Visit (HOSPITAL_COMMUNITY): Payer: Self-pay

## 2024-06-25 ENCOUNTER — Other Ambulatory Visit: Payer: Self-pay

## 2024-06-25 ENCOUNTER — Other Ambulatory Visit (HOSPITAL_COMMUNITY): Payer: Self-pay

## 2024-06-25 MED ORDER — BIMATOPROST 0.03 % OP SOLN
1.0000 [drp] | Freq: Every day | OPHTHALMIC | 10 refills | Status: AC
  Filled 2024-06-25: qty 2.5, 25d supply, fill #0

## 2024-07-13 ENCOUNTER — Encounter (INDEPENDENT_AMBULATORY_CARE_PROVIDER_SITE_OTHER): Payer: 59 | Admitting: Ophthalmology

## 2024-08-17 ENCOUNTER — Encounter: Payer: Self-pay | Admitting: Internal Medicine
